# Patient Record
Sex: Female | Born: 1945 | Race: Black or African American | Hispanic: No | Marital: Married | Smoking: Never smoker
Health system: Southern US, Community
[De-identification: ages and names within clinical notes are randomized; demographics above are authoritative.]

## PROBLEM LIST (undated history)

## (undated) DIAGNOSIS — E785 Hyperlipidemia, unspecified: Secondary | ICD-10-CM

## (undated) DIAGNOSIS — E079 Disorder of thyroid, unspecified: Secondary | ICD-10-CM

## (undated) DIAGNOSIS — Z923 Personal history of irradiation: Secondary | ICD-10-CM

## (undated) DIAGNOSIS — I10 Essential (primary) hypertension: Secondary | ICD-10-CM

## (undated) DIAGNOSIS — Z803 Family history of malignant neoplasm of breast: Secondary | ICD-10-CM

## (undated) DIAGNOSIS — C50919 Malignant neoplasm of unspecified site of unspecified female breast: Secondary | ICD-10-CM

## (undated) DIAGNOSIS — Z800 Family history of malignant neoplasm of digestive organs: Secondary | ICD-10-CM

## (undated) DIAGNOSIS — Z973 Presence of spectacles and contact lenses: Secondary | ICD-10-CM

## (undated) DIAGNOSIS — C801 Malignant (primary) neoplasm, unspecified: Secondary | ICD-10-CM

## (undated) DIAGNOSIS — E119 Type 2 diabetes mellitus without complications: Secondary | ICD-10-CM

## (undated) DIAGNOSIS — Z972 Presence of dental prosthetic device (complete) (partial): Secondary | ICD-10-CM

## (undated) HISTORY — DX: Presence of dental prosthetic device (complete) (partial): Z97.2

## (undated) HISTORY — DX: Family history of malignant neoplasm of digestive organs: Z80.0

## (undated) HISTORY — DX: Type 2 diabetes mellitus without complications: E11.9

## (undated) HISTORY — DX: Family history of malignant neoplasm of breast: Z80.3

## (undated) HISTORY — PX: APPENDECTOMY: SHX54

## (undated) HISTORY — DX: Essential (primary) hypertension: I10

## (undated) HISTORY — DX: Malignant (primary) neoplasm, unspecified: C80.1

## (undated) HISTORY — DX: Hyperlipidemia, unspecified: E78.5

## (undated) HISTORY — PX: BREAST SURGERY: SHX581

## (undated) HISTORY — DX: Disorder of thyroid, unspecified: E07.9

## (undated) HISTORY — PX: TUBAL LIGATION: SHX77

## (undated) HISTORY — DX: Presence of spectacles and contact lenses: Z97.3

---

## 1998-04-05 ENCOUNTER — Other Ambulatory Visit: Admission: RE | Admit: 1998-04-05 | Discharge: 1998-04-05 | Payer: Self-pay | Admitting: *Deleted

## 2000-05-15 HISTORY — PX: BREAST LUMPECTOMY: SHX2

## 2001-04-29 ENCOUNTER — Other Ambulatory Visit: Admission: RE | Admit: 2001-04-29 | Discharge: 2001-04-29 | Payer: Self-pay | Admitting: *Deleted

## 2002-05-06 ENCOUNTER — Other Ambulatory Visit: Admission: RE | Admit: 2002-05-06 | Discharge: 2002-05-06 | Payer: Self-pay | Admitting: *Deleted

## 2006-05-15 HISTORY — PX: COLONOSCOPY: SHX174

## 2010-01-31 ENCOUNTER — Ambulatory Visit: Payer: Self-pay | Admitting: Genetic Counselor

## 2010-02-04 ENCOUNTER — Encounter: Admission: RE | Admit: 2010-02-04 | Discharge: 2010-02-04 | Payer: Self-pay | Admitting: General Surgery

## 2010-03-07 ENCOUNTER — Ambulatory Visit (HOSPITAL_COMMUNITY): Admission: RE | Admit: 2010-03-07 | Discharge: 2010-03-08 | Payer: Self-pay | Admitting: General Surgery

## 2010-03-07 ENCOUNTER — Encounter (INDEPENDENT_AMBULATORY_CARE_PROVIDER_SITE_OTHER): Payer: Self-pay | Admitting: General Surgery

## 2010-03-07 ENCOUNTER — Encounter: Admission: RE | Admit: 2010-03-07 | Discharge: 2010-03-07 | Payer: Self-pay | Admitting: General Surgery

## 2010-03-07 HISTORY — PX: BREAST LUMPECTOMY: SHX2

## 2010-03-10 ENCOUNTER — Ambulatory Visit: Payer: Self-pay | Admitting: Oncology

## 2010-03-14 ENCOUNTER — Ambulatory Visit: Payer: Self-pay | Admitting: Oncology

## 2010-04-05 ENCOUNTER — Ambulatory Visit
Admission: RE | Admit: 2010-04-05 | Discharge: 2010-06-03 | Payer: Self-pay | Source: Home / Self Care | Attending: Radiation Oncology | Admitting: Radiation Oncology

## 2010-06-02 ENCOUNTER — Ambulatory Visit: Payer: Self-pay | Admitting: Oncology

## 2010-06-06 LAB — CBC WITH DIFFERENTIAL/PLATELET
Eosinophils Absolute: 0.2 10*3/uL (ref 0.0–0.5)
HGB: 11.8 g/dL (ref 11.6–15.9)
MCV: 84.7 fL (ref 79.5–101.0)
MONO%: 8.3 % (ref 0.0–14.0)
NEUT#: 5.3 10*3/uL (ref 1.5–6.5)
RBC: 4.18 10*6/uL (ref 3.70–5.45)
RDW: 14.8 % — ABNORMAL HIGH (ref 11.2–14.5)
WBC: 7.6 10*3/uL (ref 3.9–10.3)
lymph#: 1.4 10*3/uL (ref 0.9–3.3)

## 2010-06-15 ENCOUNTER — Ambulatory Visit: Payer: BC Managed Care – PPO | Admitting: Radiation Oncology

## 2010-07-08 ENCOUNTER — Ambulatory Visit: Payer: BC Managed Care – PPO | Attending: Radiation Oncology | Admitting: Radiation Oncology

## 2010-07-27 LAB — DIFFERENTIAL
Basophils Absolute: 0 10*3/uL (ref 0.0–0.1)
Basophils Relative: 0 % (ref 0–1)
Eosinophils Relative: 2 % (ref 0–5)
Monocytes Absolute: 0.6 10*3/uL (ref 0.1–1.0)

## 2010-07-27 LAB — CBC
Hemoglobin: 11.8 g/dL — ABNORMAL LOW (ref 12.0–15.0)
MCV: 87 fL (ref 78.0–100.0)
Platelets: 284 10*3/uL (ref 150–400)
RBC: 4.14 MIL/uL (ref 3.87–5.11)
WBC: 11.4 10*3/uL — ABNORMAL HIGH (ref 4.0–10.5)

## 2010-07-27 LAB — COMPREHENSIVE METABOLIC PANEL
AST: 23 U/L (ref 0–37)
Albumin: 4.2 g/dL (ref 3.5–5.2)
Alkaline Phosphatase: 87 U/L (ref 39–117)
BUN: 18 mg/dL (ref 6–23)
CO2: 30 mEq/L (ref 19–32)
Chloride: 103 mEq/L (ref 96–112)
GFR calc Af Amer: >60 mL/min (ref >60)
Potassium: 4 mEq/L (ref 3.5–5.1)
Total Bilirubin: 0.4 mg/dL (ref 0.3–1.2)

## 2010-08-08 ENCOUNTER — Other Ambulatory Visit: Payer: Self-pay | Admitting: Oncology

## 2010-08-08 ENCOUNTER — Encounter (HOSPITAL_BASED_OUTPATIENT_CLINIC_OR_DEPARTMENT_OTHER): Payer: BC Managed Care – PPO | Admitting: Oncology

## 2010-08-08 DIAGNOSIS — C50919 Malignant neoplasm of unspecified site of unspecified female breast: Secondary | ICD-10-CM

## 2010-08-08 DIAGNOSIS — C50519 Malignant neoplasm of lower-outer quadrant of unspecified female breast: Secondary | ICD-10-CM

## 2010-08-08 LAB — CBC WITH DIFFERENTIAL/PLATELET
BASO%: 0.3 % (ref 0.0–2.0)
EOS%: 7.7 % — ABNORMAL HIGH (ref 0.0–7.0)
HCT: 35.5 % (ref 34.8–46.6)
LYMPH%: 18.4 % (ref 14.0–49.7)
MCH: 28.3 pg (ref 25.1–34.0)
MCHC: 33.5 g/dL (ref 31.5–36.0)
MCV: 84.3 fL (ref 79.5–101.0)
NEUT%: 66.2 % (ref 38.4–76.8)
Platelets: 242 10*3/uL (ref 145–400)

## 2010-08-08 LAB — COMPREHENSIVE METABOLIC PANEL
AST: 20 U/L (ref 0–37)
BUN: 14 mg/dL (ref 6–23)
Calcium: 9.7 mg/dL (ref 8.4–10.5)
Chloride: 101 mEq/L (ref 96–112)
Creatinine, Ser: 0.85 mg/dL (ref 0.40–1.20)

## 2010-08-16 ENCOUNTER — Other Ambulatory Visit: Payer: Self-pay | Admitting: Oncology

## 2010-08-16 ENCOUNTER — Encounter (HOSPITAL_BASED_OUTPATIENT_CLINIC_OR_DEPARTMENT_OTHER): Payer: BC Managed Care – PPO | Admitting: Oncology

## 2010-08-16 DIAGNOSIS — C50519 Malignant neoplasm of lower-outer quadrant of unspecified female breast: Secondary | ICD-10-CM

## 2010-08-16 DIAGNOSIS — C50919 Malignant neoplasm of unspecified site of unspecified female breast: Secondary | ICD-10-CM

## 2010-08-16 DIAGNOSIS — Z1231 Encounter for screening mammogram for malignant neoplasm of breast: Secondary | ICD-10-CM

## 2010-11-29 ENCOUNTER — Encounter (INDEPENDENT_AMBULATORY_CARE_PROVIDER_SITE_OTHER): Payer: BC Managed Care – PPO | Admitting: General Surgery

## 2011-01-02 ENCOUNTER — Encounter (INDEPENDENT_AMBULATORY_CARE_PROVIDER_SITE_OTHER): Payer: Self-pay | Admitting: General Surgery

## 2011-01-02 ENCOUNTER — Ambulatory Visit
Admission: RE | Admit: 2011-01-02 | Discharge: 2011-01-02 | Disposition: A | Discharge status: Home / Self Care | Payer: BC Managed Care – PPO | Source: Ambulatory Visit | Attending: Oncology | Admitting: Oncology

## 2011-01-02 ENCOUNTER — Ambulatory Visit (INDEPENDENT_AMBULATORY_CARE_PROVIDER_SITE_OTHER): Payer: BC Managed Care – PPO | Admitting: General Surgery

## 2011-01-02 DIAGNOSIS — Z1231 Encounter for screening mammogram for malignant neoplasm of breast: Secondary | ICD-10-CM

## 2011-01-02 DIAGNOSIS — Z853 Personal history of malignant neoplasm of breast: Secondary | ICD-10-CM

## 2011-01-02 DIAGNOSIS — C50919 Malignant neoplasm of unspecified site of unspecified female breast: Secondary | ICD-10-CM

## 2011-01-02 NOTE — Progress Notes (Signed)
Subjective:     Patient ID: Victoria Hess, female   DOB: 11/27/1945, 65 y.o.   MRN: [id number]  HPI This is a 65 year old female with a history of right breast DCIS treated previously I treated her in 2011 for a clinic for stage II left breast cancer there was a T1 C N1 MIC tumor. She underwent lumpectomy and sentinel node biopsy. This was followed by radiation therapy in that she's been maintained on letrozole now. She reports a number of things going on her life with her friends that have caused some issues with her. She reports no complaints referable to her own health or anything related to her breasts. She does have some still some occasional pain he complains of some thickness of her left breast also. She is due to get her mammogram today.  Review of Systems     Objective:   Physical Exam  Neck: Neck supple.  Pulmonary/Chest: Right breast exhibits no inverted nipple, no mass, no nipple discharge, no skin change and no tenderness. Left breast exhibits skin change (associated with radiation changes). Left breast exhibits no inverted nipple, no mass, no nipple discharge and no tenderness. Breasts are symmetrical.         No axillary adenopathy bilaterally  Lymphadenopathy:    She has no cervical adenopathy.       Assessment:     History of right breast DCIS and left breast T1cN78mic tumor treated with lump, sentinel node, xrt and letrozole    Plan:         She does not have anything concerning on her exam today. She is going to continue her own exams. She is due to get her mammogram today which I will followup. She is tolerating her letrozole very well. She previously had negative genetic testing. She is due to see Dr. Darnelle Catalan in October and asked her to come back and see me 6 months after that. The changes on the left breast are post radiation in nature and should continue to resolve over time.

## 2011-01-03 DIAGNOSIS — Z0000 Encounter for general adult medical examination without abnormal findings: Secondary | ICD-10-CM | POA: Insufficient documentation

## 2011-02-07 ENCOUNTER — Other Ambulatory Visit: Payer: Self-pay | Admitting: Oncology

## 2011-02-07 ENCOUNTER — Encounter (HOSPITAL_BASED_OUTPATIENT_CLINIC_OR_DEPARTMENT_OTHER): Payer: BC Managed Care – PPO | Admitting: Oncology

## 2011-02-07 DIAGNOSIS — C50919 Malignant neoplasm of unspecified site of unspecified female breast: Secondary | ICD-10-CM

## 2011-02-07 LAB — COMPREHENSIVE METABOLIC PANEL
Albumin: 3.7 g/dL (ref 3.5–5.2)
BUN: 19 mg/dL (ref 6–23)
CO2: 31 mEq/L (ref 19–32)
Calcium: 10.2 mg/dL (ref 8.4–10.5)
Chloride: 98 mEq/L (ref 96–112)
Glucose, Bld: 89 mg/dL (ref 70–99)
Potassium: 3.3 mEq/L — ABNORMAL LOW (ref 3.5–5.3)

## 2011-02-07 LAB — CBC WITH DIFFERENTIAL/PLATELET
Basophils Absolute: 0 10*3/uL (ref 0.0–0.1)
Eosinophils Absolute: 0.2 10*3/uL (ref 0.0–0.5)
HGB: 10.9 g/dL — ABNORMAL LOW (ref 11.6–15.9)
MCV: 86.8 fL (ref 79.5–101.0)
MONO#: 0.5 10*3/uL (ref 0.1–0.9)
MONO%: 6.3 % (ref 0.0–14.0)
NEUT#: 6.5 10*3/uL (ref 1.5–6.5)
RDW: 14.9 % — ABNORMAL HIGH (ref 11.2–14.5)
lymph#: 1.2 10*3/uL (ref 0.9–3.3)

## 2011-02-07 LAB — CANCER ANTIGEN 27.29: CA 27.29: 27 U/mL (ref 0–39)

## 2011-02-14 ENCOUNTER — Encounter (HOSPITAL_BASED_OUTPATIENT_CLINIC_OR_DEPARTMENT_OTHER): Payer: BC Managed Care – PPO | Admitting: Oncology

## 2011-02-14 DIAGNOSIS — F411 Generalized anxiety disorder: Secondary | ICD-10-CM

## 2011-02-14 DIAGNOSIS — C50519 Malignant neoplasm of lower-outer quadrant of unspecified female breast: Secondary | ICD-10-CM

## 2011-02-14 DIAGNOSIS — C50919 Malignant neoplasm of unspecified site of unspecified female breast: Secondary | ICD-10-CM

## 2011-05-04 ENCOUNTER — Ambulatory Visit (INDEPENDENT_AMBULATORY_CARE_PROVIDER_SITE_OTHER): Payer: BC Managed Care – PPO | Admitting: Internal Medicine

## 2011-05-04 ENCOUNTER — Encounter: Payer: Self-pay | Admitting: Internal Medicine

## 2011-05-04 DIAGNOSIS — Z853 Personal history of malignant neoplasm of breast: Secondary | ICD-10-CM

## 2011-05-04 DIAGNOSIS — R9431 Abnormal electrocardiogram [ECG] [EKG]: Secondary | ICD-10-CM

## 2011-05-04 DIAGNOSIS — N814 Uterovaginal prolapse, unspecified: Secondary | ICD-10-CM

## 2011-05-04 DIAGNOSIS — N8112 Cystocele, lateral: Secondary | ICD-10-CM

## 2011-05-04 DIAGNOSIS — E785 Hyperlipidemia, unspecified: Secondary | ICD-10-CM

## 2011-05-04 DIAGNOSIS — I10 Essential (primary) hypertension: Secondary | ICD-10-CM

## 2011-05-15 ENCOUNTER — Encounter: Payer: Self-pay | Admitting: Internal Medicine

## 2011-05-15 DIAGNOSIS — I10 Essential (primary) hypertension: Secondary | ICD-10-CM | POA: Insufficient documentation

## 2011-05-15 NOTE — Patient Instructions (Signed)
Continue same medications and return in 6 months 

## 2011-06-13 ENCOUNTER — Other Ambulatory Visit: Payer: Self-pay | Admitting: *Deleted

## 2011-06-13 DIAGNOSIS — C50919 Malignant neoplasm of unspecified site of unspecified female breast: Secondary | ICD-10-CM

## 2011-06-13 MED ORDER — LETROZOLE 2.5 MG PO TABS: 2.5000 mg | ORAL_TABLET | Freq: Every day | ORAL | Status: DC

## 2011-06-26 ENCOUNTER — Other Ambulatory Visit: Payer: Self-pay | Admitting: Internal Medicine

## 2011-06-26 ENCOUNTER — Other Ambulatory Visit: Payer: BC Managed Care – PPO | Admitting: Internal Medicine

## 2011-06-26 DIAGNOSIS — I10 Essential (primary) hypertension: Secondary | ICD-10-CM | POA: Diagnosis not present

## 2011-06-26 DIAGNOSIS — E559 Vitamin D deficiency, unspecified: Secondary | ICD-10-CM

## 2011-06-26 DIAGNOSIS — R7301 Impaired fasting glucose: Secondary | ICD-10-CM | POA: Diagnosis not present

## 2011-06-26 LAB — CBC WITH DIFFERENTIAL/PLATELET
Basophils Absolute: 0 10*3/uL (ref 0.0–0.1)
Lymphocytes Relative: 20 % (ref 12–46)
Lymphs Abs: 1.5 10*3/uL (ref 0.7–4.0)
Neutrophils Relative %: 68 % (ref 43–77)
Platelets: 269 10*3/uL (ref 150–400)
RBC: 4.14 MIL/uL (ref 3.87–5.11)
RDW: 15.1 % (ref 11.5–15.5)
WBC: 7.6 10*3/uL (ref 4.0–10.5)

## 2011-06-26 LAB — COMPREHENSIVE METABOLIC PANEL
ALT: 14 U/L (ref 0–35)
AST: 19 U/L (ref 0–37)
CO2: 30 mEq/L (ref 19–32)
Calcium: 10.1 mg/dL (ref 8.4–10.5)
Chloride: 99 mEq/L (ref 96–112)
Sodium: 139 mEq/L (ref 135–145)
Total Bilirubin: 0.4 mg/dL (ref 0.3–1.2)
Total Protein: 7.2 g/dL (ref 6.0–8.3)

## 2011-06-26 LAB — LIPID PANEL
Cholesterol: 217 mg/dL — ABNORMAL HIGH (ref 0–200)
Total CHOL/HDL Ratio: 3.1 Ratio
VLDL: 14 mg/dL (ref 0–40)

## 2011-06-26 LAB — TSH: TSH: 4.875 u[IU]/mL — ABNORMAL HIGH (ref 0.350–4.500)

## 2011-06-27 LAB — VITAMIN D 25 HYDROXY (VIT D DEFICIENCY, FRACTURES): Vit D, 25-Hydroxy: 56 ng/mL (ref 30–89)

## 2011-06-29 ENCOUNTER — Encounter: Payer: Self-pay | Admitting: Internal Medicine

## 2011-06-29 ENCOUNTER — Ambulatory Visit (INDEPENDENT_AMBULATORY_CARE_PROVIDER_SITE_OTHER): Payer: BC Managed Care – PPO | Admitting: Internal Medicine

## 2011-06-29 VITALS — BP 174/94 | HR 76 | Ht 62.5 in | Wt 216.0 lb

## 2011-06-29 DIAGNOSIS — Z1211 Encounter for screening for malignant neoplasm of colon: Secondary | ICD-10-CM

## 2011-06-29 DIAGNOSIS — E039 Hypothyroidism, unspecified: Secondary | ICD-10-CM | POA: Diagnosis not present

## 2011-06-29 DIAGNOSIS — D649 Anemia, unspecified: Secondary | ICD-10-CM | POA: Diagnosis not present

## 2011-06-29 DIAGNOSIS — I10 Essential (primary) hypertension: Secondary | ICD-10-CM | POA: Diagnosis not present

## 2011-06-29 DIAGNOSIS — E669 Obesity, unspecified: Secondary | ICD-10-CM

## 2011-06-29 DIAGNOSIS — Z124 Encounter for screening for malignant neoplasm of cervix: Secondary | ICD-10-CM

## 2011-06-29 DIAGNOSIS — E119 Type 2 diabetes mellitus without complications: Secondary | ICD-10-CM

## 2011-06-29 DIAGNOSIS — E785 Hyperlipidemia, unspecified: Secondary | ICD-10-CM

## 2011-06-29 DIAGNOSIS — Z853 Personal history of malignant neoplasm of breast: Secondary | ICD-10-CM

## 2011-06-29 LAB — POCT URINALYSIS DIPSTICK
Bilirubin, UA: NEGATIVE
Blood, UA: NEGATIVE
Leukocytes, UA: NEGATIVE
Nitrite, UA: NEGATIVE
Protein, UA: NEGATIVE
Urobilinogen, UA: NEGATIVE
pH, UA: 5.5

## 2011-06-29 LAB — HEMOGLOBIN A1C
Hgb A1c MFr Bld: 6.3 % — ABNORMAL HIGH (ref <5.7)
Mean Plasma Glucose: 134 mg/dL — ABNORMAL HIGH (ref <117)

## 2011-06-29 LAB — IRON AND TIBC: TIBC: 368 ug/dL (ref 250–470)

## 2011-06-30 ENCOUNTER — Other Ambulatory Visit (HOSPITAL_COMMUNITY)
Admission: RE | Admit: 2011-06-30 | Discharge: 2011-06-30 | Disposition: A | Discharge status: Home / Self Care | Payer: BC Managed Care – PPO | Source: Ambulatory Visit | Attending: Internal Medicine | Admitting: Internal Medicine

## 2011-06-30 DIAGNOSIS — Z124 Encounter for screening for malignant neoplasm of cervix: Secondary | ICD-10-CM | POA: Insufficient documentation

## 2011-07-19 NOTE — Progress Notes (Signed)
Subjective:    Patient ID: Victoria Hess, female    DOB: January 20, 1946, 66 y.o.   MRN: [id number]  HPI First  visit for 66 year old Black female referred by Dr. Darnelle Catalan with history of right breast cancer in 1992 and left breast cancer in 2011. History of hypertension controlled on Bystolic, HCTZ and amlodipine. History of tonsillectomy and adenoidectomy 1955, appendectomy and tubal ligation 1977.  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 She is married. Lives in [place]. 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  Patient says she has a history of abnormal EKG. Brings no old records with her but set from Dr. Gaye Pollack @[facility]. History of hyperlipidemia with total cholesterol of 233 and LDL cholesterol of 159 in July 2010 bowel records. Triglycerides were normal the and and HDL cholesterol was 57.  Had bilateral ductal carcinoma in situ. Had wide excision procedure of the left breast in 2011 and a lesser procedure done on the right breast in 2002. No old EKG to examine. Says she had an EKG done at [facility] for we have not been able to find that. Patient says that her EKG looks "funny" but [facility] assured her it was within normal limits. History of uterine fibroids, cystocele and uterine prolapse.  Patient does not smoke or consume alcohol. She has an adult daughter age 64. Husband is 33 years old and in good health.  Family history: Father died at age 28 of a stroke mother died from coronary disease. One brother age 59 with history of hypertension and diabetes, one sister with hypertension and diabetes, another sister with hypertension, one sister in good health without medical problems.  Patient does not smoke or consume alcohol.  Pap smear done 12/23/2009 in [place] was negative. Had bone density study there which was normal 12/31/2009. Had colonoscopy March 2008. Tdap 3/08. Had Pneumovax  01/03/2011.    Review of Systems  Constitutional: Negative.   HENT: Negative.   Eyes: Negative.   Respiratory: Negative.   Cardiovascular: Negative.   Gastrointestinal: Negative.   Genitourinary: Negative.   Musculoskeletal: Negative.   Neurological: Negative.   Hematological: Negative.   Psychiatric/Behavioral: Negative.        Objective:   Physical Exam  Vitals reviewed. Constitutional: She is oriented to person, place, and time. She appears well-developed and well-nourished.  HENT:  Head: Normocephalic and atraumatic.  Right Ear: External ear normal.  Left Ear: External ear normal.  Mouth/Throat: Oropharynx is clear and moist. No oropharyngeal exudate.  Eyes: Conjunctivae and EOM are normal. Pupils are equal, round, and reactive to light. Right eye exhibits no discharge.  Neck: Normal range of motion. Neck supple. No JVD present. No thyromegaly present.  Cardiovascular: Normal rate, regular rhythm, normal heart sounds and intact distal pulses.   No murmur heard. Pulmonary/Chest: Effort normal and breath sounds normal. She has no rales.  Abdominal: Soft. Bowel sounds are normal. She exhibits no distension and no mass. There is no rebound and no guarding.  Genitourinary:       Deferred  Musculoskeletal: She exhibits no edema.  Lymphadenopathy:    She has no cervical adenopathy.  Neurological: She is alert and oriented to person, place, and time. She has normal reflexes. No cranial nerve deficit. Coordination normal.  Skin: Skin is warm and dry. She is not diaphoretic.  Psychiatric: She has a normal mood and affect. Her behavior is normal.  Assessment & Plan:  History of bilateral ductal carcinoma in situ  Hypertension  Hyperlipidemia  Cystocele  Uterine prolapse  Abnormal EKG  Plan: Patient is to help me find her old EKG in cardiology evaluation. [facility] says they have no cardiology records on file. Patient is to return for followup in the next few  weeks.

## 2011-07-20 ENCOUNTER — Encounter: Payer: Self-pay | Admitting: Internal Medicine

## 2011-08-01 ENCOUNTER — Encounter (INDEPENDENT_AMBULATORY_CARE_PROVIDER_SITE_OTHER): Payer: Self-pay | Admitting: General Surgery

## 2011-08-01 ENCOUNTER — Ambulatory Visit (INDEPENDENT_AMBULATORY_CARE_PROVIDER_SITE_OTHER): Payer: BC Managed Care – PPO | Admitting: General Surgery

## 2011-08-01 VITALS — BP 168/104 | HR 60 | Temp 96.8°F | Resp 16 | Ht 64.5 in | Wt 215.0 lb

## 2011-08-01 DIAGNOSIS — Z853 Personal history of malignant neoplasm of breast: Secondary | ICD-10-CM | POA: Diagnosis not present

## 2011-08-01 NOTE — Progress Notes (Signed)
Subjective:     Patient ID: Victoria Hess, female   DOB: 1946/03/13, 66 y.o.   MRN: [id number]  HPI 41 yof with history of right lumpectomy and then I did left lumpectomy with snbx for t1n52mic tumor.  This was followed by xrt and then now has been maintained on letrozole which she is tolerating well.  She reports no complaints referable to her breasts.  She had mmg in August of 2012 and is due to get one next August as well.  She comes in today for six month follow up.  Review of Systems     Objective:   Physical Exam  Vitals reviewed. Constitutional: She appears well-developed and well-nourished.  Neck: Neck supple.  Cardiovascular: Normal rate, regular rhythm and normal heart sounds.   Pulmonary/Chest: Effort normal and breath sounds normal. She has no wheezes. She has no rales. Right breast exhibits no inverted nipple, no mass, no nipple discharge, no skin change and no tenderness. Left breast exhibits skin change (consistent with radiation therapy). Left breast exhibits no inverted nipple, no mass, no nipple discharge and no tenderness. Breasts are symmetrical.    Lymphadenopathy:    She has no cervical adenopathy.    She has no axillary adenopathy.       Right: No supraclavicular adenopathy present.       Left: No supraclavicular adenopathy present.       Assessment:    History of breast cancer    Plan:    She has no clinical evidence of recurrence and her mmg is up to date. She is doing self exams. She is tolerating letrozole well now.  I will refer her back to see Dr. Darnelle Catalan in six months as she is on AI.  She should have her mmg when she sees him also.  I will plan on seeing her back in one year.

## 2011-08-01 NOTE — Patient Instructions (Signed)

## 2011-08-04 ENCOUNTER — Telehealth (INDEPENDENT_AMBULATORY_CARE_PROVIDER_SITE_OTHER): Payer: Self-pay

## 2011-08-04 NOTE — Telephone Encounter (Signed)
LMOM with Dr Margrinat's nurse v.m. Stating that the pt needed a f/u appt with Dr Darnelle Catalan in 6months since she just saw Dr Dwain Sarna this am. The pt just needs to be seeing them every 6 months per Dr Dwain Sarna.

## 2011-08-08 ENCOUNTER — Telehealth: Payer: Self-pay | Admitting: Oncology

## 2011-08-08 NOTE — Telephone Encounter (Signed)
S/w pt today re appts for 4/3 and 4/16.

## 2011-08-12 ENCOUNTER — Encounter: Payer: Self-pay | Admitting: Internal Medicine

## 2011-08-12 DIAGNOSIS — D649 Anemia, unspecified: Secondary | ICD-10-CM | POA: Insufficient documentation

## 2011-08-12 DIAGNOSIS — E785 Hyperlipidemia, unspecified: Secondary | ICD-10-CM | POA: Insufficient documentation

## 2011-08-12 DIAGNOSIS — E1169 Type 2 diabetes mellitus with other specified complication: Secondary | ICD-10-CM | POA: Insufficient documentation

## 2011-08-12 DIAGNOSIS — E669 Obesity, unspecified: Secondary | ICD-10-CM | POA: Insufficient documentation

## 2011-08-12 DIAGNOSIS — E039 Hypothyroidism, unspecified: Secondary | ICD-10-CM | POA: Insufficient documentation

## 2011-08-12 NOTE — Progress Notes (Signed)
  Subjective:    Patient ID: Victoria Hess, female    DOB: [birth year], 66 y.o.   MRN: [id number]  HPI 66 year old black female initially presented here 05/04/2011 for the first time upon referral by oncologist for primary care. We don't have a lot of records on this patient from previous physicians who treated her. Patient says that she had cardiology evaluation previously  In [place]. We have not been able to locate those records. We have asked patient to get [place] those records. Records from [facility] in August 2012 indicate patient was on HCTZ 25 mg daily, amlodipine 5 mg daily and diastolic 20 mg daily for hypertension. Patient reports that she has an abnormal EKG. EKG done today shows Q-wave inversion in V2 through V5, intraventricular conduction delay and  sinus arrhythmia. Patient is also here today to have Pap smear done. She had recent fasting lab work showing a mild anemia with hemoglobin of 11.4 g. Anemia workup will be added to lab work including B12, folate, iron and iron-binding capacity. She also has been found to be hypothyroid. She will need to start thyroid replacement therapy. TSH is 4.8. She will be given 3 Hemoccult cards. Her blood pressure is still not well controlled. She also has been found to be diabetic based on recent fasting lab work with hemoglobin A1c of 6.3%.    Review of Systems     Objective:   Physical Exam chest clear to auscultation; cardiac exam regular rate and rhythm. Pelvic exam has cystocele. Pap smear taken. No masses on bimanual exam.        Assessment & Plan:  Hypertension-currently not well controlled based on current reading  Hyperlipidemia  Diabetes mellitus -new diagnosis  History of breast cancer  New diagnosis of hypothyroidism  Obesity  History of abnormal EKG by history but no old EKGs to compare. Patient says cardiac workup in [place] was normal. She needs to get those records.  Mild anemia-iron level is low normal. B12  and folate levels are normal. 3 Hemoccult cards given. Had colonoscopy in 2008. Patient should take iron supplementation. We need to followup with mild anemia and abnormal hemoglobin A1c and TSH in about 3 months.

## 2011-08-16 ENCOUNTER — Other Ambulatory Visit (HOSPITAL_BASED_OUTPATIENT_CLINIC_OR_DEPARTMENT_OTHER): Payer: BC Managed Care – PPO | Admitting: Lab

## 2011-08-16 ENCOUNTER — Ambulatory Visit: Payer: BC Managed Care – PPO | Admitting: Physician Assistant

## 2011-08-16 DIAGNOSIS — C50919 Malignant neoplasm of unspecified site of unspecified female breast: Secondary | ICD-10-CM

## 2011-08-16 DIAGNOSIS — C50519 Malignant neoplasm of lower-outer quadrant of unspecified female breast: Secondary | ICD-10-CM | POA: Diagnosis not present

## 2011-08-16 LAB — CBC & DIFF AND RETIC
Basophils Absolute: 0 10*3/uL (ref 0.0–0.1)
Eosinophils Absolute: 0.2 10*3/uL (ref 0.0–0.5)
HGB: 11.6 g/dL (ref 11.6–15.9)
Immature Retic Fract: 10.6 % — ABNORMAL HIGH (ref 1.60–10.00)
MCV: 84.1 fL (ref 79.5–101.0)
NEUT#: 5.5 10*3/uL (ref 1.5–6.5)
RDW: 14.8 % — ABNORMAL HIGH (ref 11.2–14.5)
Retic Ct Abs: 61.98 10*3/uL (ref 33.70–90.70)
lymph#: 1.8 10*3/uL (ref 0.9–3.3)

## 2011-08-16 LAB — COMPREHENSIVE METABOLIC PANEL
AST: 18 U/L (ref 0–37)
Albumin: 4.4 g/dL (ref 3.5–5.2)
BUN: 19 mg/dL (ref 6–23)
CO2: 30 mEq/L (ref 19–32)
Calcium: 10 mg/dL (ref 8.4–10.5)
Creatinine, Ser: 0.86 mg/dL (ref 0.50–1.10)
Glucose, Bld: 75 mg/dL (ref 70–99)
Sodium: 141 mEq/L (ref 135–145)

## 2011-08-16 LAB — FERRITIN: Ferritin: 107 ng/mL (ref 10–291)

## 2011-08-22 ENCOUNTER — Ambulatory Visit: Payer: BC Managed Care – PPO | Admitting: Physician Assistant

## 2011-08-29 ENCOUNTER — Telehealth: Payer: Self-pay | Admitting: *Deleted

## 2011-08-29 ENCOUNTER — Ambulatory Visit (HOSPITAL_BASED_OUTPATIENT_CLINIC_OR_DEPARTMENT_OTHER): Payer: BC Managed Care – PPO | Admitting: Physician Assistant

## 2011-08-29 ENCOUNTER — Encounter: Payer: Self-pay | Admitting: Physician Assistant

## 2011-08-29 VITALS — BP 179/102 | HR 72 | Temp 98.4°F | Ht 64.5 in | Wt 218.4 lb

## 2011-08-29 DIAGNOSIS — C50919 Malignant neoplasm of unspecified site of unspecified female breast: Secondary | ICD-10-CM

## 2011-08-29 DIAGNOSIS — Z853 Personal history of malignant neoplasm of breast: Secondary | ICD-10-CM

## 2011-08-29 MED ORDER — LETROZOLE 2.5 MG PO TABS: 2.5000 mg | ORAL_TABLET | Freq: Every day | ORAL | Status: DC

## 2011-08-29 NOTE — Telephone Encounter (Signed)
made patient appointment for mammogram and bone density on 01-04-2012 at 3:00pm gave patient appointment for 02-2012 lab one week before the md appointment

## 2011-08-29 NOTE — Progress Notes (Signed)
ID: Victoria Hess   DOB: 05-14-1946  MR#: [id number]  CSN#:[id number]  HISTORY OF PRESENT ILLNESS: The patient has a history of prior right lumpectomy and radiation therapy (2002) for what by her account was a Stage 0 ductal carcinoma in situ.  More recently, she had diagnostic mammography at [facility] on December 31, 2009 and this showed group calcifications in the left breast felt to be moderately suspicious for malignancy.  The patient was set up for stereotactic biopsy 01/04/2010 and the pathology report [id number] at the [facility]) showed a small focus of invasive adenocarcinoma, grade 1, with some evidence of in situ carcinoma.  The tumor was estrogen receptor positive at 100%, progesterone receptor positive at 100% and there was no Her-2 amplification by the Hercept test with a score of 1+.    With this information, the patient was referred to [facility] for bilateral breast MRIs and these were performed February 04, 2010.  There was some postoperative change in the upper outer quadrant of the right breast with no suspicious findings.  In the left breast, lower outer quadrant, there was an irregular spiculated enhancing mass measuring 2.3 cm maximally.  There were no other masses associated with this and there were no suspicious internal mammary or axillary lymph nodes noted.    With this information, the patient was referred to Dr. Dwain Sarna and he set her up for genetic testing which came back favorable (no BRCA-1 or BRCA-2 mutations detected).  He then set her up for lumpectomy and sentinel lymph node dissection which was performed March 07, 2010.  The final pathology from that procedure ([id number]) showed a Grade 1 invasive ductal carcinoma measuring 1.5 cm with margins negative and ample.  There was no lymphovascular invasion.  One of four sentinel lymph nodes sampled had a micrometastatic deposit.  Repeat prognostic panel showed the tumor to be ER  positive at 100%, PR positive at 66% and showed no Her-2 amplification by CISH with a ratio of 1.24.  The patient has had an uneventful postoperative course.  The patient proceeded to radiation therapy which was completed in January 2012. She began letrozole in January 2012 and continues, 2.5 mg daily, with good tolerance.  INTERVAL HISTORY: Victoria Hess returns today for routine six-month followup of her left breast carcinoma. Interval history is generally unremarkable. She is being followed regularly by Dr. Lenord Fellers for primary care, and in fact has an appointment with her in a couple of weeks.  Victoria Hess  continues on letrozole which she is tolerating well. She has no significant hot flashes, no increased joint pain, no vaginal dryness.   REVIEW OF SYSTEMS: The patient has had no recent illnesses, other than a slight cold a few weeks ago, and denies any fevers, chills, or night sweats. Her energy level is fair. She feels a little tearful at times, and feels like she is "more sensitive than she used to be" since going through the cancer diagnosis. She denies any actual depression or feelings of hopelessness, and denies suicidal ideations.  Bit nausea and no change in bowel habits. No cough, shortness of breath, or chest pain. No abnormal headaches, dizziness, change in vision.  A detailed review of systems is otherwise noncontributory.   PAST MEDICAL HISTORY: Past Medical History  Diagnosis Date  . Hypertension   . Wears glasses   . Wears dentures     partial  . Cancer     Stage 2 breast cancer    PAST SURGICAL HISTORY:  Past Surgical History  Procedure Date  . Tubal ligation   . Appendectomy   . Breast surgery     lumpectomy sentinel  node biopsy    FAMILY HISTORY Family History  Problem Relation Age of Onset  . Diabetes Mother   . Heart disease Mother   . Diabetes Father   . Cancer Father   . Diabetes Sister   . Hyperlipidemia Sister   . Cancer Sister     breast      GYNECOLOGIC HISTORY: The patient is GX P1, first pregnancy to term at age 57. She went through the change of life around the year 2000.  She did not receive hormone replacement.  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  Her husband of 44 years, Les Pou, works in Surveyor, quantity.  Daughter Albin Felling, 52, works as a temp.  The patient has two granddaughters, aged 3 and 68.  She is a Control and instrumentation engineer    ADVANCED DIRECTIVES:  HEALTH MAINTENANCE: History  Substance Use Topics  . Smoking status: Never Smoker   . Smokeless tobacco: Never Used  . Alcohol Use: No     Colonoscopy:  PAP:  Bone density:  Lipid panel:  No Known Allergies  Current Outpatient Prescriptions  Medication Sig Dispense Refill  . AMLODIPINE BESYLATE PO Take 5 mg by mouth daily. Takes 1 1/2 tabs daily      . aspirin 81 MG tablet Take 81 mg by mouth daily.        . Calcium Carbonate (CALCIUM 600 PO) Take by mouth daily.        . Cholecalciferol (VITAMIN D PO) Take 2,000 mg by mouth daily.        . hydrochlorothiazide 25 MG tablet Take 25 mg by mouth daily.        Marland Kitchen letrozole (FEMARA) 2.5 MG tablet Take 1 tablet (2.5 mg total) by mouth daily.  90 tablet  3  . Multiple Vitamin (MULTIVITAMIN PO) Take by mouth daily.        . Nebivolol HCl (BYSTOLIC) 20 MG TABS Take by mouth daily.          OBJECTIVE: Filed Vitals:   08/29/11 1514  BP: 179/102  Pulse: 72  Temp: 98.4 F (36.9 C)     Body mass index is 36.91 kg/(m^2).    ECOG FS: 0  Physical Exam: HEENT:  Sclerae anicteric, conjunctivae pink.  Oropharynx clear.  No mucositis or candidiasis.   Nodes:  No cervical, supraclavicular, or axillary lymphadenopathy palpated.  Breast Exam:  Right breast is status post lumpectomy, no suspicious nodules or masses, no skin changes, no nipple inversion. Left breast status post lumpectomy. No suspicious nodularities or skin changes. There is some hyperpigmentation secondary to radiation  changes, but no evidence of local recurrence.  Lungs:  Clear to auscultation bilaterally.  No crackles, rhonchi, or wheezes.   Heart:  Regular rate , irregular rhythm. No murmurs or rubs. Abdomen:  Soft, nontender.  Positive bowel sounds.  No organomegaly or masses palpated.   Musculoskeletal:  No focal spinal tenderness to palpation.  Extremities:  Benign.  No peripheral edema or cyanosis.   Skin:  Benign.   Neuro:  Nonfocal. alert and oriented x3.    LAB RESULTS: Lab Results  Component Value Date   WBC 8.0 08/16/2011   NEUTROABS 5.5 08/16/2011   HGB 11.6 08/16/2011   HCT 35.0 08/16/2011   MCV 84.1 08/16/2011   PLT 268  08/16/2011      Chemistry      Component Value Date/Time   NA 141 08/16/2011 1358   K 3.9 08/16/2011 1358   CL 101 08/16/2011 1358   CO2 30 08/16/2011 1358   BUN 19 08/16/2011 1358   CREATININE 0.86 08/16/2011 1358   CREATININE 0.77 06/26/2011 0910      Component Value Date/Time   CALCIUM 10.0 08/16/2011 1358   ALKPHOS 87 08/16/2011 1358   AST 18 08/16/2011 1358   ALT 18 08/16/2011 1358   BILITOT 0.3 08/16/2011 1358       Lab Results  Component Value Date   LABCA2 33 08/16/2011   Ferritin within normal range at 107 on 08/16/2011.   STUDIES: This recent bilateral mammogram was in August 2012 at the [facility] with no evidence of malignancy.  Most recent bone density was at [facility] in August 2011, and was normal.  ASSESSMENT:  66 year old [place] woman: 1. Status post right lumpectomy 2002 for what likely was a ductal carcinoma in situ, treated with radiation under [facility] Bal. 2. Status post left lumpectomy October 2011 for a T1c N1 (mic) stage II invasive ductal carcinoma, grade 1, strongly estrogen and progesterone receptor positive, HER2/neu negative.  Margins were ample.  After radiation therapy completed in January 2012, she started letrozole with good tolerance.    PLAN: Overall, Jakiya appears to be doing well. There is no clinical evidence of  disease recurrence, and she will continue on letrozole which I have refilled for another year. We discussed participation in the  [facility] program, but her hindrance is the fact that she lives in [place]. She may contact Lenell Antu who helps organize the group here to see if she could link in with a program in [place].   With regards to followup, she'll continue to follow with Dr. Lenord Fellers as before for routine health care. She'll be due for her next mammogram as well as her next bone density in August and we'll see Dr. Darnelle Catalan soon thereafter in October for her 6 month followup. We will recheck labs at that time as well.  Patient voices understanding and agreement with our plan, and will call with any changes or problems.   Braedyn Kauk    08/29/2011

## 2011-09-07 ENCOUNTER — Ambulatory Visit (INDEPENDENT_AMBULATORY_CARE_PROVIDER_SITE_OTHER): Payer: BC Managed Care – PPO | Admitting: Internal Medicine

## 2011-09-07 VITALS — BP 164/82 | HR 58 | Ht 64.0 in | Wt 216.0 lb

## 2011-09-07 DIAGNOSIS — I10 Essential (primary) hypertension: Secondary | ICD-10-CM

## 2011-09-07 DIAGNOSIS — E039 Hypothyroidism, unspecified: Secondary | ICD-10-CM

## 2011-10-12 ENCOUNTER — Ambulatory Visit (INDEPENDENT_AMBULATORY_CARE_PROVIDER_SITE_OTHER): Payer: BC Managed Care – PPO | Admitting: Internal Medicine

## 2011-10-12 ENCOUNTER — Encounter: Payer: Self-pay | Admitting: Internal Medicine

## 2011-10-12 VITALS — BP 156/84 | HR 56 | Temp 99.1°F | Wt 215.0 lb

## 2011-10-12 DIAGNOSIS — Z853 Personal history of malignant neoplasm of breast: Secondary | ICD-10-CM | POA: Diagnosis not present

## 2011-10-12 DIAGNOSIS — I10 Essential (primary) hypertension: Secondary | ICD-10-CM | POA: Diagnosis not present

## 2011-10-12 NOTE — Progress Notes (Signed)
  Subjective:    Patient ID: Victoria Hess, female    DOB: 1945/06/30, 66 y.o.   MRN: [id number]  HPI patient with history of hypertension which has been somewhat difficult to control. She's on Bystolic 20 mg daily, HCTZ 25 mg daily, amlodipine 10 mg daily.  In addition she has diet-controlled diabetes with hemoglobin A1c of 6.3%, was started on Synthroid recently for hypothyroidism. Unable to find any more cardiac records on her.    Review of Systems     Objective:   Physical Exam neck is supple without JVD thyromegaly or carotid bruits; cardiac exam regular rate and rhythm; extremities 1+ edema        Assessment & Plan:  Hypertension-not well controlled on current regimen  Edema related to amlodipine 10 mg daily    Plan: Patient to discontinue HCTZ and start Lasix 40 mg daily. She doesn't want to start Lasix until school is out June 7. She will return here in early July for blood pressure followup and basic metabolic panel. We are starting her on K-Dur 20 MA daily twice daily. Potassium checked in the recent past showed a borderline normal. Lasix will likely result in hypokalemia. She may need cardiology consultation if blood pressure can't be controlled. She says mother had history of severe hypertension.

## 2011-10-12 NOTE — Patient Instructions (Signed)
Discontinue HCTZ. Start Lasix 40 mg daily. Take potassium supplementation twice daily. Return around July 7 for followup.

## 2011-10-30 ENCOUNTER — Other Ambulatory Visit: Payer: Self-pay

## 2011-10-30 MED ORDER — LEVOTHYROXINE SODIUM 50 MCG PO TABS: 50.0000 ug | ORAL_TABLET | Freq: Every day | ORAL | Status: DC

## 2011-11-14 NOTE — Progress Notes (Signed)
  Subjective:    Patient ID: Victoria Hess, female    DOB: 1945/09/21, 66 y.o.   MRN: [id number]  HPI 66 year old black female in today for followup of hypertension. She is a history of breast cancer is followed at the [facility]. She is on letrozole. She is on Bystolic 20 mg daily and HCTZ 25 mg daily. Has been taking amlodipine 1-1/2 tabs daily of a 5 mg tablet. We're trying to locate some of her old records from physician in [place]. I had asked her to keep multiple blood pressure readings since the initial visit with me. Blood pressure readings could be better. She also has mild hypothyroidism and diabetes mellitus. Hemoglobin A1c was 6.3%. Was found to be mildly iron deficient with hemoglobin 11.4 g and was started on iron replacement in February.    Review of Systems     Objective:   Physical Exam neck supple without JVD thyromegaly or carotid bruits; chest clear to auscultation; cardiac exam regular rate and rhythm; extremities trace edema        Assessment & Plan:  Hypertension  Plan: Increase amlodipine to 10 mg daily. Return in 4 weeks.

## 2011-11-21 ENCOUNTER — Ambulatory Visit: Payer: BC Managed Care – PPO | Admitting: Internal Medicine

## 2011-11-28 ENCOUNTER — Ambulatory Visit (INDEPENDENT_AMBULATORY_CARE_PROVIDER_SITE_OTHER): Payer: BC Managed Care – PPO | Admitting: Internal Medicine

## 2011-11-28 ENCOUNTER — Encounter: Payer: Self-pay | Admitting: Internal Medicine

## 2011-11-28 VITALS — BP 154/80 | HR 64 | Temp 98.8°F | Ht 64.0 in | Wt 201.5 lb

## 2011-11-28 DIAGNOSIS — I10 Essential (primary) hypertension: Secondary | ICD-10-CM

## 2011-11-28 LAB — BASIC METABOLIC PANEL
BUN: 13 mg/dL (ref 6–23)
CO2: 29 mEq/L (ref 19–32)
Calcium: 10.1 mg/dL (ref 8.4–10.5)
Chloride: 103 mEq/L (ref 96–112)
Creat: 0.89 mg/dL (ref 0.50–1.10)

## 2011-11-28 MED ORDER — POTASSIUM CHLORIDE CRYS ER 20 MEQ PO TBCR: 20.0000 meq | EXTENDED_RELEASE_TABLET | Freq: Two times a day (BID) | ORAL | Status: DC

## 2011-11-28 NOTE — Progress Notes (Signed)
  Subjective:    Patient ID: Victoria Hess, female    DOB: 05-26-1945, 66 y.o.   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 We have been trying to get her hypertension under better control. Last visit we added Lasix 40 mg daily instead of HCTZ. She brings in multiple blood pressure readings today taken from home which are very acceptable. Says she has an element of office hypertension which is been present for a number of years. Blood pressure today is elevated systolically in this office. Blood pressure readings taken at home since late June show systolic readings 118-136 and diastolic readings 72-78. Be met drawn today to followup on potassium on Lasix. She's tolerating Lasix fairly well at the present time. She is on a potassium supplement.    Review of Systems     Objective:   Physical Exam neck supple without thyromegaly JVD or carotid bruits; chest clear to auscultation; cardiac exam regular rate and rhythm normal S1 and S2; extremities without edema. Skin is warm and dry. Alert and oriented x3.        Assessment & Plan:   Office hypertension  Hypertension-better controlled on Lasix and HCTZ  Plan: Patient is to return in 4 months and continue to monitor her blood pressures at home.

## 2011-11-28 NOTE — Patient Instructions (Addendum)
Continue same medications and return in 4 months. Continue to monitor your blood pressures at home.

## 2011-12-10 ENCOUNTER — Encounter: Payer: Self-pay | Admitting: Internal Medicine

## 2011-12-10 NOTE — Patient Instructions (Addendum)
Increase amlodipine to 10 mg daily and return in 4 weeks

## 2012-01-04 ENCOUNTER — Ambulatory Visit
Admission: RE | Admit: 2012-01-04 | Discharge: 2012-01-04 | Disposition: A | Discharge status: Home / Self Care | Payer: BC Managed Care – PPO | Source: Ambulatory Visit | Attending: Physician Assistant | Admitting: Physician Assistant

## 2012-01-04 DIAGNOSIS — C50919 Malignant neoplasm of unspecified site of unspecified female breast: Secondary | ICD-10-CM

## 2012-01-04 DIAGNOSIS — Z853 Personal history of malignant neoplasm of breast: Secondary | ICD-10-CM | POA: Diagnosis not present

## 2012-01-18 ENCOUNTER — Other Ambulatory Visit: Payer: Self-pay

## 2012-01-18 MED ORDER — AMLODIPINE BESYLATE 10 MG PO TABS: 10.0000 mg | ORAL_TABLET | Freq: Every day | ORAL | Status: DC

## 2012-01-22 ENCOUNTER — Other Ambulatory Visit: Payer: Self-pay

## 2012-01-22 MED ORDER — FUROSEMIDE 40 MG PO TABS: 40.0000 mg | ORAL_TABLET | Freq: Every day | ORAL | Status: DC

## 2012-02-07 ENCOUNTER — Other Ambulatory Visit: Payer: Self-pay

## 2012-02-07 MED ORDER — AMLODIPINE BESYLATE 10 MG PO TABS: 10.0000 mg | ORAL_TABLET | Freq: Every day | ORAL | Status: DC

## 2012-02-29 ENCOUNTER — Other Ambulatory Visit (HOSPITAL_BASED_OUTPATIENT_CLINIC_OR_DEPARTMENT_OTHER): Payer: BC Managed Care – PPO | Admitting: Lab

## 2012-02-29 DIAGNOSIS — C50919 Malignant neoplasm of unspecified site of unspecified female breast: Secondary | ICD-10-CM

## 2012-02-29 LAB — COMPREHENSIVE METABOLIC PANEL (CC13)
ALT: 13 U/L (ref 0–55)
AST: 17 U/L (ref 5–34)
Alkaline Phosphatase: 100 U/L (ref 40–150)
CO2: 26 mEq/L (ref 22–29)
Sodium: 138 mEq/L (ref 136–145)
Total Bilirubin: 0.3 mg/dL (ref 0.20–1.20)
Total Protein: 7.4 g/dL (ref 6.4–8.3)

## 2012-02-29 LAB — CBC WITH DIFFERENTIAL/PLATELET
BASO%: 0.3 % (ref 0.0–2.0)
EOS%: 2.1 % (ref 0.0–7.0)
LYMPH%: 21.5 % (ref 14.0–49.7)
MCHC: 33.1 g/dL (ref 31.5–36.0)
MCV: 85.6 fL (ref 79.5–101.0)
MONO%: 7.9 % (ref 0.0–14.0)
Platelets: 238 10*3/uL (ref 145–400)
RBC: 3.98 10*6/uL (ref 3.70–5.45)
RDW: 15.1 % — ABNORMAL HIGH (ref 11.2–14.5)
WBC: 8.3 10*3/uL (ref 3.9–10.3)

## 2012-03-07 ENCOUNTER — Ambulatory Visit (HOSPITAL_BASED_OUTPATIENT_CLINIC_OR_DEPARTMENT_OTHER): Payer: BC Managed Care – PPO | Admitting: Oncology

## 2012-03-07 VITALS — BP 150/80 | HR 65 | Temp 98.6°F | Resp 20 | Ht 64.0 in | Wt 194.0 lb

## 2012-03-07 DIAGNOSIS — Z853 Personal history of malignant neoplasm of breast: Secondary | ICD-10-CM

## 2012-03-07 DIAGNOSIS — C50519 Malignant neoplasm of lower-outer quadrant of unspecified female breast: Secondary | ICD-10-CM | POA: Diagnosis not present

## 2012-03-07 DIAGNOSIS — Z170 Estrogen receptor positive status [ER+]: Secondary | ICD-10-CM | POA: Diagnosis not present

## 2012-03-07 DIAGNOSIS — C50919 Malignant neoplasm of unspecified site of unspecified female breast: Secondary | ICD-10-CM

## 2012-03-07 MED ORDER — LETROZOLE 2.5 MG PO TABS: 2.5000 mg | ORAL_TABLET | Freq: Every day | ORAL | Status: DC

## 2012-03-07 NOTE — Progress Notes (Signed)
ID: Victoria Hess   DOB: April 21, 1946  MR#: [id number]  WUJ#:[id number]  HISTORY OF PRESENT ILLNESS: The patient has a history of prior right lumpectomy and radiation therapy (2002) for what by her account was a Stage 0 ductal carcinoma in situ.  More recently, she had diagnostic mammography at [facility] on December 31, 2009 and this showed group calcifications in the left breast felt to be moderately suspicious for malignancy.  The patient was set up for stereotactic biopsy 01/04/2010 and the pathology report [id number] at the [facility]) showed a small focus of invasive adenocarcinoma, grade 1, with some evidence of in situ carcinoma.  The tumor was estrogen receptor positive at 100%, progesterone receptor positive at 100% and there was no Her-2 amplification by the Hercept test with a score of 1+.    Bilateral breast MRIs were performed February 04, 2010.  There was some postoperative change in the upper outer quadrant of the right breast with no suspicious findings.  In the left breast, lower outer quadrant, there was an irregular spiculated enhancing mass measuring 2.3 cm maximally.  There were no other masses associated with this and there were no suspicious internal mammary or axillary lymph nodes noted.    With this information, the patient was referred to Dr. Dwain Sarna and he set her up for genetic testing which came back favorable (no BRCA-1 or BRCA-2 mutations detected).  He then set her up for lumpectomy and sentinel lymph node dissection which was performed March 07, 2010.  The final pathology from that procedure ([id number]) showed a Grade 1 invasive ductal carcinoma measuring 1.5 cm with margins negative and ample.  There was no lymphovascular invasion.  One of four sentinel lymph nodes sampled had a micrometastatic deposit.  Repeat prognostic panel showed the tumor to be ER positive at 100%, PR positive at 66% and showed no Her-2 amplification by CISH with a  ratio of 1.24. Her subsequent history is as detailed below  INTERVAL HISTORY: Jeanne returns today for routine followup of her breast cancer. She is doing "great". 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 Family and particularly HER-2 grandchildren are doing "fine".  REVIEW OF SYSTEMS: She is tolerating the letrozole with no side effects that she is aware of, and in particular she is having significant hot flashes, vaginal dryness, or arthralgia/myalgia symptoms. Aside from problems with her glasses and dentures, a detailed review of systems today was otherwise entirely negative.  PAST MEDICAL HISTORY: Past Medical History  Diagnosis Date  . Hypertension   . Wears glasses   . Wears dentures     partial  . Cancer     Stage 2 breast cancer    PAST SURGICAL HISTORY: Past Surgical History  Procedure Date  . Tubal ligation   . Appendectomy   . Breast surgery     lumpectomy sentinel  node biopsy    FAMILY HISTORY Family History  Problem Relation Age of Onset  . Diabetes Mother   . Heart disease Mother   . Diabetes Father   . Cancer Father   . Diabetes Sister   . Hyperlipidemia Sister   . Cancer Sister     breast     GYNECOLOGIC HISTORY: The patient is GX P1, first pregnancy to term at age 48. She went through the change of life around the year 2000.  She did not receive hormone replacement.  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  Her husband of 44+ years, Les Pou, works in Surveyor, quantity.  Daughter Albin Felling works as a temp.  The patient has two granddaughters, aged 61 and 41.  She is a Control and instrumentation engineer    ADVANCED DIRECTIVES: not in place  HEALTH MAINTENANCE: History  Substance Use Topics  . Smoking status: Never Smoker   . Smokeless tobacco: Never Used  . Alcohol Use: No     Colonoscopy:  PAP:  Bone density:2013/ normal  Lipid panel:  No Known Allergies  Current Outpatient  Prescriptions  Medication Sig Dispense Refill  . amLODipine (NORVASC) 10 MG tablet Take 1 tablet (10 mg total) by mouth daily.  30 tablet  5  . aspirin 81 MG tablet Take 81 mg by mouth daily.        . Calcium Carbonate (CALCIUM 600 PO) Take by mouth daily.        . Cholecalciferol (VITAMIN D PO) Take 2,000 mg by mouth daily.        . furosemide (LASIX) 40 MG tablet Take 1 tablet (40 mg total) by mouth daily.  30 tablet  5  . letrozole (FEMARA) 2.5 MG tablet Take 1 tablet (2.5 mg total) by mouth daily.  90 tablet  3  . levothyroxine (SYNTHROID, LEVOTHROID) 50 MCG tablet Take 1 tablet (50 mcg total) by mouth daily.  30 tablet  4  . Multiple Vitamin (MULTIVITAMIN PO) Take by mouth daily.        . Nebivolol HCl (BYSTOLIC) 20 MG TABS Take by mouth daily.        . potassium chloride SA (K-DUR,KLOR-CON) 20 MEQ tablet Take 1 tablet (20 mEq total) by mouth 2 (two) times daily.  60 tablet  5  . ALPRAZolam (XANAX) 0.25 MG tablet Take 0.25 mg by mouth 2 (two) times daily as needed.        OBJECTIVE: Middle-aged [place] American woman in no acute distress Filed Vitals:   03/07/12 1609  BP: 150/80  Pulse: 65  Temp: 98.6 F (37 C)  Resp: 20     Body mass index is 33.30 kg/(m^2).    ECOG FS: 0  Sclerae unicteric Oropharynx clear No cervical or supraclavicular adenopathy Lungs no rales or rhonchi Heart regular rate and rhythm Abd benign MSK no focal spinal tenderness, no peripheral edema Neuro: nonfocal Breasts: The right breast is status post remote lumpectomy. There are no suspicious masses, skin changes, or nipple retraction. The left breast is status post lumpectomy and radiation. There is still some hyperpigmentation and skin edema, but these are expected changes. There are no findings to suggest local recurrence. Both axillae are benign.  LAB RESULTS: Lab Results  Component Value Date   WBC 8.3 02/29/2012   NEUTROABS 5.7 02/29/2012   HGB 11.3* 02/29/2012   HCT 34.1* 02/29/2012   MCV  85.6 02/29/2012   PLT 238 02/29/2012      Chemistry      Component Value Date/Time   NA 138 02/29/2012 1517   NA 143 11/28/2011 1159   K 3.9 02/29/2012 1517   K 4.3 11/28/2011 1159   CL 102 02/29/2012 1517   CL 103 11/28/2011 1159   CO2 26 02/29/2012 1517   CO2 29 11/28/2011 1159   BUN 18.0 02/29/2012 1517   BUN 13 11/28/2011 1159   CREATININE 0.9 02/29/2012 1517   CREATININE 0.89 11/28/2011 1159   CREATININE 0.86 08/16/2011 1358      Component Value Date/Time  CALCIUM 10.4 02/29/2012 1517   CALCIUM 10.1 11/28/2011 1159   ALKPHOS 100 02/29/2012 1517   ALKPHOS 87 08/16/2011 1358   AST 17 02/29/2012 1517   AST 18 08/16/2011 1358   ALT 13 02/29/2012 1517   ALT 18 08/16/2011 1358   BILITOT 0.30 02/29/2012 1517   BILITOT 0.3 08/16/2011 1358       Lab Results  Component Value Date   LABCA2 34 02/29/2012   Ferritin within normal range at 107 on 08/16/2011.   STUDIES: Bone density 01/04/2012 was normal as was mammography on the same date  ASSESSMENT:  66 year old [place] woman: 1. Status post right lumpectomy 2002 for what likely was a ductal carcinoma in situ, treated with radiation under [facility]. 2. Status post left lumpectomy October 2011 for a T1c N1 (mic) stage IB invasive ductal carcinoma, grade 1, strongly estrogen and progesterone receptor positive, HER2/neu negative.  Margins were ample. 3.  radiation therapy completed in January 2012 4. she started letrozole January of 2012.    PLAN: Eudelia is doing fine as far as her breast cancer is concerned. Now 2 years out from her surgery, I feel comfortable seeing her on a once a year basis, as that she gets her mammograms in August, we will see her in September. The plan is to continue letrozole for a total of 5 years, then reassess. She knows to call for any problems that may develop before then.   MAGRINAT,GUSTAV C    03/07/2012

## 2012-03-08 ENCOUNTER — Telehealth: Payer: Self-pay | Admitting: Oncology

## 2012-03-08 NOTE — Telephone Encounter (Signed)
lvm for pt regarding Sept 2014 appt.....Marland Kitchenmailed appt schedule for Sept 2014 to pt.

## 2012-03-26 ENCOUNTER — Other Ambulatory Visit: Payer: Self-pay

## 2012-03-26 MED ORDER — NEBIVOLOL HCL 20 MG PO TABS: 20.0000 mg | ORAL_TABLET | Freq: Every day | ORAL | Status: DC

## 2012-04-05 ENCOUNTER — Ambulatory Visit (INDEPENDENT_AMBULATORY_CARE_PROVIDER_SITE_OTHER): Payer: BC Managed Care – PPO | Admitting: Internal Medicine

## 2012-04-05 ENCOUNTER — Encounter: Payer: Self-pay | Admitting: Internal Medicine

## 2012-04-05 VITALS — BP 160/90 | HR 52 | Temp 98.6°F | Wt 195.0 lb

## 2012-04-05 DIAGNOSIS — I10 Essential (primary) hypertension: Secondary | ICD-10-CM | POA: Diagnosis not present

## 2012-04-05 DIAGNOSIS — E785 Hyperlipidemia, unspecified: Secondary | ICD-10-CM

## 2012-04-05 DIAGNOSIS — Z853 Personal history of malignant neoplasm of breast: Secondary | ICD-10-CM | POA: Diagnosis not present

## 2012-04-05 DIAGNOSIS — E669 Obesity, unspecified: Secondary | ICD-10-CM

## 2012-04-05 DIAGNOSIS — E039 Hypothyroidism, unspecified: Secondary | ICD-10-CM | POA: Diagnosis not present

## 2012-04-05 DIAGNOSIS — E119 Type 2 diabetes mellitus without complications: Secondary | ICD-10-CM

## 2012-04-05 DIAGNOSIS — F4321 Adjustment disorder with depressed mood: Secondary | ICD-10-CM

## 2012-04-06 LAB — BASIC METABOLIC PANEL
BUN: 17 mg/dL (ref 6–23)
Calcium: 10.3 mg/dL (ref 8.4–10.5)
Creat: 1.01 mg/dL (ref 0.50–1.10)
Glucose, Bld: 80 mg/dL (ref 70–99)

## 2012-04-06 NOTE — Patient Instructions (Addendum)
Continue same medications and return in 4-6 months for physical exam. 

## 2012-04-06 NOTE — Progress Notes (Signed)
  Subjective:    Patient ID: Victoria Hess, female    DOB: 1945/07/20, 66 y.o.   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 Blood pressure is elevated today at 160/90 but she has told me today that one of her friends is very ill in the hospital and several individuals that she knows have passed away recently. Denies being depressed about this just use was upsetting to her. She does bring in multiple blood pressure readings that are very acceptable over the past 3 weeks. Mostly systolic readings are 117-144 and diastolic readings are 68 to 80. These are very acceptable readings. She is on diuretic therapy. Serum potassium will be checked today. She also takes potassium supplement.    Review of Systems     Objective:   Physical Exam chest clear to auscultation, cardiac exam regular rate and rhythm normal S1 and S2, extremities without edema, skin is warm and dry, affect is appropriate        Assessment & Plan:  Hypertension-stable  Grief reaction-appropriate  Type 2 diabetes mellitus diet controlled  Hypothyroidism-on thyroid replacement therapy  History of breast cancer-recent checkup by Dr. Darnelle Catalan was reassuring to her

## 2012-04-25 ENCOUNTER — Other Ambulatory Visit: Payer: Self-pay

## 2012-04-25 MED ORDER — NEBIVOLOL HCL 20 MG PO TABS: 20.0000 mg | ORAL_TABLET | Freq: Every day | ORAL | Status: DC

## 2012-04-25 MED ORDER — LEVOTHYROXINE SODIUM 50 MCG PO TABS: 50.0000 ug | ORAL_TABLET | Freq: Every day | ORAL | Status: DC

## 2012-06-07 ENCOUNTER — Telehealth: Payer: Self-pay

## 2012-06-07 NOTE — Telephone Encounter (Signed)
Opened in error

## 2012-06-10 ENCOUNTER — Other Ambulatory Visit: Payer: Self-pay

## 2012-06-10 MED ORDER — POTASSIUM CHLORIDE CRYS ER 20 MEQ PO TBCR: 20.0000 meq | EXTENDED_RELEASE_TABLET | Freq: Two times a day (BID) | ORAL | Status: DC

## 2012-07-23 ENCOUNTER — Other Ambulatory Visit: Payer: Self-pay

## 2012-07-23 MED ORDER — FUROSEMIDE 40 MG PO TABS: 40.0000 mg | ORAL_TABLET | Freq: Every day | ORAL | Status: DC

## 2012-08-27 ENCOUNTER — Other Ambulatory Visit: Payer: Medicare Other | Admitting: Internal Medicine

## 2012-08-27 DIAGNOSIS — E119 Type 2 diabetes mellitus without complications: Secondary | ICD-10-CM

## 2012-08-27 DIAGNOSIS — E039 Hypothyroidism, unspecified: Secondary | ICD-10-CM

## 2012-08-27 DIAGNOSIS — E559 Vitamin D deficiency, unspecified: Secondary | ICD-10-CM | POA: Diagnosis not present

## 2012-08-27 DIAGNOSIS — Z0000 Encounter for general adult medical examination without abnormal findings: Secondary | ICD-10-CM

## 2012-08-27 LAB — COMPREHENSIVE METABOLIC PANEL
ALT: 12 U/L (ref 0–35)
AST: 16 U/L (ref 0–37)
Alkaline Phosphatase: 92 U/L (ref 39–117)
Calcium: 10.1 mg/dL (ref 8.4–10.5)
Chloride: 101 mEq/L (ref 96–112)
Creat: 0.93 mg/dL (ref 0.50–1.10)
Potassium: 4.2 mEq/L (ref 3.5–5.3)

## 2012-08-27 LAB — CBC WITH DIFFERENTIAL/PLATELET
Basophils Absolute: 0 10*3/uL (ref 0.0–0.1)
Basophils Relative: 1 % (ref 0–1)
Lymphocytes Relative: 27 % (ref 12–46)
MCHC: 33.6 g/dL (ref 30.0–36.0)
Neutro Abs: 3.6 10*3/uL (ref 1.7–7.7)
Platelets: 280 10*3/uL (ref 150–400)
RDW: 14.8 % (ref 11.5–15.5)
WBC: 5.7 10*3/uL (ref 4.0–10.5)

## 2012-08-27 LAB — LIPID PANEL
LDL Cholesterol: 114 mg/dL — ABNORMAL HIGH (ref 0–99)
VLDL: 17 mg/dL (ref 0–40)

## 2012-08-27 LAB — TSH: TSH: 1.326 u[IU]/mL (ref 0.350–4.500)

## 2012-08-27 LAB — HEMOGLOBIN A1C: Hgb A1c MFr Bld: 6.2 % — ABNORMAL HIGH (ref <5.7)

## 2012-08-28 LAB — VITAMIN D 25 HYDROXY (VIT D DEFICIENCY, FRACTURES): Vit D, 25-Hydroxy: 60 ng/mL (ref 30–89)

## 2012-08-29 ENCOUNTER — Encounter: Payer: Self-pay | Admitting: Internal Medicine

## 2012-08-29 ENCOUNTER — Ambulatory Visit (INDEPENDENT_AMBULATORY_CARE_PROVIDER_SITE_OTHER): Payer: Medicare Other | Admitting: Internal Medicine

## 2012-08-29 VITALS — BP 136/84 | HR 60 | Temp 98.3°F | Ht 63.75 in | Wt 185.0 lb

## 2012-08-29 DIAGNOSIS — I10 Essential (primary) hypertension: Secondary | ICD-10-CM | POA: Diagnosis not present

## 2012-08-29 DIAGNOSIS — E119 Type 2 diabetes mellitus without complications: Secondary | ICD-10-CM

## 2012-08-29 DIAGNOSIS — Z853 Personal history of malignant neoplasm of breast: Secondary | ICD-10-CM | POA: Diagnosis not present

## 2012-08-29 DIAGNOSIS — E669 Obesity, unspecified: Secondary | ICD-10-CM

## 2012-08-29 DIAGNOSIS — Z0000 Encounter for general adult medical examination without abnormal findings: Secondary | ICD-10-CM | POA: Diagnosis not present

## 2012-08-29 DIAGNOSIS — E1169 Type 2 diabetes mellitus with other specified complication: Secondary | ICD-10-CM

## 2012-09-03 ENCOUNTER — Ambulatory Visit (INDEPENDENT_AMBULATORY_CARE_PROVIDER_SITE_OTHER): Payer: Managed Care, Other (non HMO) | Admitting: General Surgery

## 2012-09-03 ENCOUNTER — Encounter (INDEPENDENT_AMBULATORY_CARE_PROVIDER_SITE_OTHER): Payer: Self-pay | Admitting: General Surgery

## 2012-09-03 VITALS — BP 120/74 | HR 52 | Temp 97.6°F | Resp 18 | Ht 63.5 in | Wt 184.0 lb

## 2012-09-03 DIAGNOSIS — Z853 Personal history of malignant neoplasm of breast: Secondary | ICD-10-CM

## 2012-09-03 NOTE — Progress Notes (Signed)
Subjective:     Patient ID: Victoria Hess, female   DOB: Jul 13, 1945, 67 y.o.   MRN: [id number]  HPI This is a 67 year old female who underwent a right lumpectomy with radiation therapy in 2002 at [facility] for ductal carcinoma in situ. More recently in October of 2011 she had a left lumpectomy and a sentinel lymph node biopsy for a T1C N55mic tumor. This was followed by radiation therapy and she is now on letrozole for which she is tolerating well. She had her mammogram in August it was a BI-RADS 2 and recommended for followup in one year. She has no complaints referable to either breast. She has no symptoms of lymphedema at all. She is working and is doing very well overall.  Review of Systems     Objective:   Physical Exam  Vitals reviewed. Constitutional: She appears well-developed and well-nourished.  Pulmonary/Chest: Right breast exhibits no inverted nipple, no mass, no nipple discharge, no skin change and no tenderness. Left breast exhibits skin change (c/w xrt). Left breast exhibits no inverted nipple, no mass, no nipple discharge and no tenderness.    Lymphadenopathy:    She has no cervical adenopathy.    She has no axillary adenopathy.       Right: No supraclavicular adenopathy present.       Left: No supraclavicular adenopathy present.       Assessment:     S/p bilateral breast cancer     Plan:     She has no clinical evidence of recurrence. She is due to get her mammogram in one year and we'll continue her monthly self-examinations. She will continue her Femara. I will plan on seeing her back in one year or sooner if needed. She will follow up with medical oncology in 6 months.

## 2012-10-08 ENCOUNTER — Other Ambulatory Visit: Payer: Self-pay

## 2012-10-08 MED ORDER — AMLODIPINE BESYLATE 10 MG PO TABS: 10.0000 mg | ORAL_TABLET | Freq: Every day | ORAL | Status: DC

## 2012-11-18 ENCOUNTER — Other Ambulatory Visit: Payer: Self-pay

## 2012-11-18 MED ORDER — LEVOTHYROXINE SODIUM 50 MCG PO TABS: 50.0000 ug | ORAL_TABLET | Freq: Every day | ORAL | Status: DC

## 2012-11-18 MED ORDER — NEBIVOLOL HCL 20 MG PO TABS: 20.0000 mg | ORAL_TABLET | Freq: Every day | ORAL | Status: DC

## 2012-11-25 ENCOUNTER — Other Ambulatory Visit (INDEPENDENT_AMBULATORY_CARE_PROVIDER_SITE_OTHER): Payer: Self-pay | Admitting: General Surgery

## 2012-11-25 DIAGNOSIS — Z853 Personal history of malignant neoplasm of breast: Secondary | ICD-10-CM

## 2013-01-20 ENCOUNTER — Other Ambulatory Visit: Payer: Self-pay

## 2013-01-20 MED ORDER — POTASSIUM CHLORIDE CRYS ER 20 MEQ PO TBCR: 20.0000 meq | EXTENDED_RELEASE_TABLET | Freq: Two times a day (BID) | ORAL | Status: DC

## 2013-01-22 ENCOUNTER — Telehealth: Payer: Self-pay | Admitting: Oncology

## 2013-01-22 ENCOUNTER — Other Ambulatory Visit: Payer: Self-pay | Admitting: Physician Assistant

## 2013-01-22 DIAGNOSIS — C50919 Malignant neoplasm of unspecified site of unspecified female breast: Secondary | ICD-10-CM

## 2013-01-22 NOTE — Telephone Encounter (Signed)
Returned pt's call re cx and r/s 9/11 appt. Pt needs latest appt poss due to work and was made aware that 2:45pm for lb/AB is the latest on any given day. 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 Pt given my direct #.

## 2013-01-23 ENCOUNTER — Other Ambulatory Visit: Payer: BC Managed Care – PPO | Admitting: Lab

## 2013-01-23 ENCOUNTER — Encounter: Payer: Self-pay | Admitting: Physician Assistant

## 2013-01-23 ENCOUNTER — Ambulatory Visit: Payer: BC Managed Care – PPO | Admitting: Physician Assistant

## 2013-01-23 NOTE — Progress Notes (Signed)
Per phone note on 01/22/2013, patient called to cancel today's appointment and will call to reschedule. Accordingly, I have not sent an FTKA letter.  Zollie Scale, PA-C 01/23/2013

## 2013-01-29 ENCOUNTER — Ambulatory Visit
Admission: RE | Admit: 2013-01-29 | Discharge: 2013-01-29 | Disposition: A | Discharge status: Home / Self Care | Payer: Commercial Indemnity | Source: Ambulatory Visit | Attending: General Surgery | Admitting: General Surgery

## 2013-01-29 DIAGNOSIS — Z853 Personal history of malignant neoplasm of breast: Secondary | ICD-10-CM

## 2013-02-12 ENCOUNTER — Telehealth: Payer: Self-pay | Admitting: Oncology

## 2013-02-12 NOTE — Telephone Encounter (Signed)
Pt called today to r/s 9/11 appt and was given new appt d/t for 10/27 lb/AB @ 1:15pm.

## 2013-02-13 ENCOUNTER — Encounter: Payer: Self-pay | Admitting: Internal Medicine

## 2013-02-13 NOTE — Progress Notes (Signed)
  Subjective:    Patient ID: Victoria Hess, female    DOB: [birth year], 67 y.o.   MRN: [id number]  HPI 67 year old Black female with history of right breast cancer diagnosed in 1992, left breast cancer diagnosed in 2011. She is followed by Dr. Darnelle Catalan. History of hypertension and obesity. However, she's lost 21 pounds since her last visit.  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 She is married. Lives in [place]. 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  Tonsillectomy and adenoidectomy in 1955, appendectomy and tubal ligation 1977.  She had bilateral ductal carcinoma in situ. She had a wide excision procedure of the left breast in 2011 and a lesser procedure done on the right breast in 2002.  History of uterine fibroids, cystocele, uterine prolapse.  She has a history of hyperlipidemia with elevated LDL cholesterol and normal triglycerides.  Patient does not smoke or consume alcohol. Husband is in good health. Chest and adult daughter.  Family history: Father died at age 22 of a stroke. Mother died from coronary artery disease. One brother age 24 with history of hypertension and diabetes, one sister with hypertension and diabetes, another sister with hypertension, one sister in good health without medical problems.    Review of Systems  Constitutional: Positive for fatigue.  All other systems reviewed and are negative.       Objective:   Physical Exam  Constitutional: She is oriented to person, place, and time. She appears well-developed and well-nourished. No distress.  HENT:  Head: Normocephalic and atraumatic.  Right Ear: External ear normal.  Left Ear: External ear normal.  Mouth/Throat: Oropharynx is clear and moist. No oropharyngeal exudate.  Eyes: Conjunctivae are normal. Pupils are equal, round, and reactive to light. Right eye exhibits no discharge. Left eye exhibits no  discharge.  Neck: Neck supple. No JVD present. No thyromegaly present.  Cardiovascular: Normal rate, regular rhythm, normal heart sounds and intact distal pulses.   No murmur heard. Pulmonary/Chest: Effort normal and breath sounds normal. She has no wheezes. She has no rales.  Abdominal: Soft. Bowel sounds are normal. She exhibits no distension and no mass. There is no tenderness. There is no rebound.  Genitourinary:  Pap done 2013. Bimanual exam is normal.  Musculoskeletal: Normal range of motion. She exhibits no edema.  Lymphadenopathy:    She has no cervical adenopathy.  Neurological: She is alert and oriented to person, place, and time. She has normal reflexes. No cranial nerve deficit. Coordination normal.  Skin: Skin is warm and dry. No rash noted. She is not diaphoretic.  Psychiatric: She has a normal mood and affect. Her behavior is normal. Judgment and thought content normal.          Assessment & Plan:  Hypertension-stable  History of breast cancer-under care of Dr. Darnelle Catalan is on Femara  Obesity-has lost 21 pounds with diet and exercise since last visit  Controlled type 2 diabetes mellitus -Hemoglobin A1c 6.2%. Continue diet exercise. Recheck in 6 months.  Plan: Return in 6 months for office visit and blood pressure check, hemoglobin A1c

## 2013-02-14 NOTE — Patient Instructions (Addendum)
Continue same medications and return in 6 months. Keep up diet exercise and weight loss. I am pleased with her progress.

## 2013-03-04 ENCOUNTER — Other Ambulatory Visit: Payer: Managed Care, Other (non HMO) | Admitting: Internal Medicine

## 2013-03-04 DIAGNOSIS — E119 Type 2 diabetes mellitus without complications: Secondary | ICD-10-CM

## 2013-03-04 DIAGNOSIS — E039 Hypothyroidism, unspecified: Secondary | ICD-10-CM

## 2013-03-04 DIAGNOSIS — E58 Dietary calcium deficiency: Secondary | ICD-10-CM

## 2013-03-04 DIAGNOSIS — Z0000 Encounter for general adult medical examination without abnormal findings: Secondary | ICD-10-CM

## 2013-03-04 DIAGNOSIS — Z13220 Encounter for screening for lipoid disorders: Secondary | ICD-10-CM

## 2013-03-04 LAB — HEMOGLOBIN A1C
Hgb A1c MFr Bld: 6.3 % — ABNORMAL HIGH (ref <5.7)
Mean Plasma Glucose: 134 mg/dL — ABNORMAL HIGH (ref <117)

## 2013-03-04 LAB — BASIC METABOLIC PANEL WITH GFR
BUN: 14 mg/dL (ref 6–23)
CO2: 31 meq/L (ref 19–32)
Calcium: 10.2 mg/dL (ref 8.4–10.5)
Chloride: 99 meq/L (ref 96–112)
Creat: 0.9 mg/dL (ref 0.50–1.10)
Glucose, Bld: 113 mg/dL — ABNORMAL HIGH (ref 70–99)
Potassium: 4.3 meq/L (ref 3.5–5.3)
Sodium: 139 meq/L (ref 135–145)

## 2013-03-04 LAB — TSH: TSH: 1.851 u[IU]/mL (ref 0.350–4.500)

## 2013-03-04 LAB — LIPID PANEL
Total CHOL/HDL Ratio: 3.1 Ratio
VLDL: 17 mg/dL (ref 0–40)

## 2013-03-04 LAB — CALCIUM: Calcium: 10.2 mg/dL (ref 8.4–10.5)

## 2013-03-06 ENCOUNTER — Encounter: Payer: Self-pay | Admitting: Internal Medicine

## 2013-03-06 ENCOUNTER — Ambulatory Visit (INDEPENDENT_AMBULATORY_CARE_PROVIDER_SITE_OTHER): Payer: Managed Care, Other (non HMO) | Admitting: Internal Medicine

## 2013-03-06 VITALS — BP 170/80 | HR 60 | Temp 98.0°F | Wt 191.0 lb

## 2013-03-06 DIAGNOSIS — I10 Essential (primary) hypertension: Secondary | ICD-10-CM | POA: Diagnosis not present

## 2013-03-06 DIAGNOSIS — E119 Type 2 diabetes mellitus without complications: Secondary | ICD-10-CM

## 2013-03-06 DIAGNOSIS — E039 Hypothyroidism, unspecified: Secondary | ICD-10-CM | POA: Diagnosis not present

## 2013-03-06 DIAGNOSIS — Z23 Encounter for immunization: Secondary | ICD-10-CM

## 2013-03-06 DIAGNOSIS — E785 Hyperlipidemia, unspecified: Secondary | ICD-10-CM

## 2013-03-06 MED ORDER — SIMVASTATIN 10 MG PO TABS: 10.0000 mg | ORAL_TABLET | Freq: Every day | ORAL | Status: DC

## 2013-03-06 NOTE — Patient Instructions (Addendum)
Start Zocor 10 mg daily and RTC in 3 months. Continue same medications otherwise.

## 2013-03-06 NOTE — Progress Notes (Signed)
  Subjective:    Patient ID: Victoria Hess, female    DOB: 03/01/1946, 67 y.o.   MRN: [id number]  HPI  67 year old Black female for 6 month follow up of DM, hyperlipidemia, hypothyroidism and HTN. BP elevated today because of bad day at work. Influenza immunization given today. Hemoglobin A1c is 6.3% and previously was 6.2%. Fasting lipid panel shows total cholesterol of 215 and previously was 194. LDL cholesterol has increased from 114-128. We are monitoring her calcium. Calcium is now 10.2. Highest calcium noted was 10.4 in October 2013. TSH is within normal limits.    Review of Systems     Objective:   Physical Exam Skin is warm and dry. Neck supple without JVD thyromegaly or carotid bruits. Chest clear to auscultation. Cardiac exam regular rate and rhythm normal S1 and S2. Extremities without edema. Diabetic foot exam without calluses or ulcerations.       Assessment & Plan:  Hyperlipidemia-start Zocor 10 mg daily and return in 3 months  Hypertension-patient says blood pressures elevated due to a bad day at work. Continue to monitor at home and let me know if it is not well controlled.  Controlled type 2 diabetes mellitus  Obesity-encouraged diet and exercise  Hypothyroidism-TSH stable on current dose of Synthroid- recheck in 6 months-at which time she'll be due for physical examination  Serum calcium-continue to monitor. Thus far, levels fall within normal limits

## 2013-03-10 ENCOUNTER — Other Ambulatory Visit (HOSPITAL_BASED_OUTPATIENT_CLINIC_OR_DEPARTMENT_OTHER): Payer: Commercial Indemnity | Admitting: Lab

## 2013-03-10 ENCOUNTER — Encounter: Payer: Self-pay | Admitting: Physician Assistant

## 2013-03-10 ENCOUNTER — Ambulatory Visit (HOSPITAL_BASED_OUTPATIENT_CLINIC_OR_DEPARTMENT_OTHER): Payer: Commercial Indemnity | Admitting: Physician Assistant

## 2013-03-10 VITALS — BP 169/98 | HR 65 | Temp 98.6°F | Resp 20 | Ht 63.5 in | Wt 188.6 lb

## 2013-03-10 DIAGNOSIS — Z780 Asymptomatic menopausal state: Secondary | ICD-10-CM

## 2013-03-10 DIAGNOSIS — C50911 Malignant neoplasm of unspecified site of right female breast: Secondary | ICD-10-CM

## 2013-03-10 DIAGNOSIS — I10 Essential (primary) hypertension: Secondary | ICD-10-CM

## 2013-03-10 DIAGNOSIS — C50919 Malignant neoplasm of unspecified site of unspecified female breast: Secondary | ICD-10-CM

## 2013-03-10 DIAGNOSIS — C50912 Malignant neoplasm of unspecified site of left female breast: Secondary | ICD-10-CM

## 2013-03-10 DIAGNOSIS — Z853 Personal history of malignant neoplasm of breast: Secondary | ICD-10-CM

## 2013-03-10 DIAGNOSIS — C50519 Malignant neoplasm of lower-outer quadrant of unspecified female breast: Secondary | ICD-10-CM

## 2013-03-10 LAB — COMPREHENSIVE METABOLIC PANEL (CC13)
Albumin: 3.9 g/dL (ref 3.5–5.0)
Anion Gap: 12 mEq/L — ABNORMAL HIGH (ref 3–11)
CO2: 25 mEq/L (ref 22–29)
Glucose: 92 mg/dl (ref 70–140)
Potassium: 4 mEq/L (ref 3.5–5.1)
Sodium: 141 mEq/L (ref 136–145)
Total Protein: 7.8 g/dL (ref 6.4–8.3)

## 2013-03-10 LAB — CBC WITH DIFFERENTIAL/PLATELET
Eosinophils Absolute: 0.2 10*3/uL (ref 0.0–0.5)
MONO#: 0.6 10*3/uL (ref 0.1–0.9)
NEUT#: 4.4 10*3/uL (ref 1.5–6.5)
RBC: 4.17 10*6/uL (ref 3.70–5.45)
RDW: 14 % (ref 11.2–14.5)
WBC: 7.1 10*3/uL (ref 3.9–10.3)
lymph#: 1.9 10*3/uL (ref 0.9–3.3)

## 2013-03-10 MED ORDER — LETROZOLE 2.5 MG PO TABS: 2.5000 mg | ORAL_TABLET | Freq: Every day | ORAL | Status: DC

## 2013-03-10 NOTE — Progress Notes (Signed)
ID: Victoria Hess   DOB: 1945-07-31  MR#: [id number]  CSN#:[id number]   PCP:  Margaree Mackintosh, MD GYN: SUREmelia Loron, MD OTHER:   CHIEF COMPLAINT:  Left  Breast Cancer   HISTORY OF PRESENT ILLNESS: The patient has a history of prior right lumpectomy and radiation therapy (2002) for what by her account was a Stage 0 ductal carcinoma in situ.  More recently, she had diagnostic mammography at [facility] on December 31, 2009 and this showed group calcifications in the left breast felt to be moderately suspicious for malignancy.  The patient was set up for stereotactic biopsy 01/04/2010 and the pathology report [id number] at the [facility]) showed a small focus of invasive adenocarcinoma, grade 1, with some evidence of in situ carcinoma.  The tumor was estrogen receptor positive at 100%, progesterone receptor positive at 100% and there was no Her-2 amplification by the Hercept test with a score of 1+.    Bilateral breast MRIs were performed February 04, 2010.  There was some postoperative change in the upper outer quadrant of the right breast with no suspicious findings.  In the left breast, lower outer quadrant, there was an irregular spiculated enhancing mass measuring 2.3 cm maximally.  There were no other masses associated with this and there were no suspicious internal mammary or axillary lymph nodes noted.    With this information, the patient was referred to Dr. Dwain Sarna and he set her up for genetic testing which came back favorable (no BRCA-1 or BRCA-2 mutations detected).  He then set her up for lumpectomy and sentinel lymph node dissection which was performed March 07, 2010.  The final pathology from that procedure ([id number]) showed a Grade 1 invasive ductal carcinoma measuring 1.5 cm with margins negative and ample.  There was no lymphovascular invasion.  One of four sentinel lymph nodes sampled had a micrometastatic deposit.  Repeat prognostic  panel showed the tumor to be ER positive at 100%, PR positive at 66% and showed no Her-2 amplification by CISH with a ratio of 1.24. Her subsequent history is as detailed below  INTERVAL HISTORY: Navada returns today for routine followup of her left breast cancer. Interval history is generally unremarkable, and she is doing well. 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 She's been with the same girl since she started kindergarten. She continues on letrozole daily, with no significant side effects. In fact, overall she is doing "just fine".   REVIEW OF SYSTEMS: Koralynn  has had no recent illnesses and denies any fevers, chills, night sweats, or hot flashes. She's had no rashes, abnormal bruising, or abnormal bleeding. She denies any vaginal bleeding and has had no vaginal dryness. Her energy level is good. She admits the she's not exercise on a regular basis. She's eating well denies any nausea or change in bowel or bladder habits. She's had no cough, shortness of breath, chest pain, palpitations, or abnormal headaches. She denies any unusual myalgias, arthralgias, or bony pain, but does have some occasional back pain which is chronic and unchanged. She's had no peripheral swelling.  A detailed review of systems is otherwise stable and noncontributory.   PAST MEDICAL HISTORY: Past Medical History  Diagnosis Date  . Hypertension   . Wears glasses   . Wears dentures     partial  . Cancer     Stage 2 breast cancer    PAST SURGICAL HISTORY: Past Surgical History  Procedure Laterality  Date  . Tubal ligation    . Appendectomy    . Breast surgery      left lumpectomy sentinel  node biopsy  . Breast surgery      right lumpectomy    FAMILY HISTORY Family History  Problem Relation Age of Onset  . Diabetes Mother   . Heart disease Mother   . Diabetes Father   . Cancer Father   . Diabetes Sister   . Hyperlipidemia Sister   . Cancer Sister     breast      GYNECOLOGIC HISTORY: The patient is GX P1, first pregnancy to term at age 68. She went through the change of life around the year 2000.  She did not receive hormone replacement.  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  Her husband of 44+ years, Les Pou, works in Surveyor, quantity.  Daughter Albin Felling works as a temp.  The patient has two granddaughters, aged 48 and 49.  She is a Control and instrumentation engineer.    ADVANCED DIRECTIVES: not in place  HEALTH MAINTENANCE: (Updated 03/10/2013) History  Substance Use Topics  . Smoking status: Never Smoker   . Smokeless tobacco: Never Used  . Alcohol Use: No     Colonoscopy: UTD (Not on file)  PAP: Not on file  Bone density: 2013/ normal  Lipid panel: April 2014, Dr. Lenord Fellers  No Known Allergies  Current Outpatient Prescriptions  Medication Sig Dispense Refill  . amLODipine (NORVASC) 10 MG tablet Take 1 tablet (10 mg total) by mouth daily.  30 tablet  5  . aspirin 81 MG tablet Take 81 mg by mouth daily.        . Calcium Carbonate (CALCIUM 600 PO) Take by mouth daily.        . Cholecalciferol (VITAMIN D PO) Take 2,000 mg by mouth daily.        . furosemide (LASIX) 40 MG tablet Take 1 tablet (40 mg total) by mouth daily.  30 tablet  11  . letrozole (FEMARA) 2.5 MG tablet Take 1 tablet (2.5 mg total) by mouth daily.  90 tablet  3  . levothyroxine (SYNTHROID, LEVOTHROID) 50 MCG tablet Take 1 tablet (50 mcg total) by mouth daily.  30 tablet  3  . Multiple Vitamin (MULTIVITAMIN PO) Take by mouth daily.        . Nebivolol HCl (BYSTOLIC) 20 MG TABS Take 1 tablet (20 mg total) by mouth daily.  30 tablet  3  . potassium chloride SA (K-DUR,KLOR-CON) 20 MEQ tablet Take 1 tablet (20 mEq total) by mouth 2 (two) times daily.  60 tablet  5  . simvastatin (ZOCOR) 10 MG tablet Take 1 tablet (10 mg total) by mouth at bedtime.  90 tablet  3   No current facility-administered medications for this visit.    OBJECTIVE: Middle-aged  [place] woman who appears comfortable and is in no acute distress Filed Vitals:   03/10/13 1324  BP: 169/98  Pulse: 65  Temp: 98.6 F (37 C)  Resp: 20     Body mass index is 32.88 kg/(m^2).    ECOG FS: 0 Filed Weights   03/10/13 1324  Weight: 188 lb 9.6 oz (85.548 kg)   Physical Exam: HEENT:  Sclerae anicteric.  Oropharynx clear. Buccal mucosa is pink and moist NODES:  No cervical or supraclavicular lymphadenopathy palpated.  BREAST EXAM:  Right breast is status post remote lumpectomy with no evidence of local recurrence. Left  breast is status post lumpectomy and radiation therapy with some skin changes consistent with radiation. Otherwise, breast is unremarkable with no evidence of local recurrence. Axillae are benign bilaterally, no palpable lymphadenopathy. LUNGS:  Clear to auscultation bilaterally.  No wheezes or rhonchi HEART:  Regular rate and rhythm. No murmur  ABDOMEN:  Soft, nontender.  Positive bowel sounds.  MSK:  No focal spinal tenderness to palpation. Range of motion in the upper extremities. EXTREMITIES:  No peripheral edema.   NEURO:  Nonfocal. Well oriented.  Positive affect.    LAB RESULTS: Lab Results  Component Value Date   WBC 7.1 03/10/2013   NEUTROABS 4.4 03/10/2013   HGB 11.9 03/10/2013   HCT 36.0 03/10/2013   MCV 86.3 03/10/2013   PLT 247 03/10/2013      Chemistry      Component Value Date/Time   NA 141 03/10/2013 1301   NA 139 03/04/2013 0949   K 4.0 03/10/2013 1301   K 4.3 03/04/2013 0949   CL 99 03/04/2013 0949   CL 102 02/29/2012 1517   CO2 25 03/10/2013 1301   CO2 31 03/04/2013 0949   BUN 21.2 03/10/2013 1301   BUN 14 03/04/2013 0949   CREATININE 0.9 03/10/2013 1301   CREATININE 0.90 03/04/2013 0949   CREATININE 0.86 08/16/2011 1358      Component Value Date/Time   CALCIUM 10.0 03/10/2013 1301   CALCIUM 10.2 03/04/2013 0949   CALCIUM 10.2 03/04/2013 0949   ALKPHOS 103 03/10/2013 1301   ALKPHOS 92 08/27/2012 1045   AST 18  03/10/2013 1301   AST 16 08/27/2012 1045   ALT 13 03/10/2013 1301   ALT 12 08/27/2012 1045   BILITOT 0.23 03/10/2013 1301   BILITOT 0.4 08/27/2012 1045       Lab Results  Component Value Date   LABCA2 34 02/29/2012      STUDIES: [facility].  Most recent bilateral mammogram on 01/29/2013 was unremarkable.   ASSESSMENT:  67 year old [place] woman: 1. Status post right lumpectomy 2002 for what likely was a ductal carcinoma in situ, treated with radiation under [facility]. 2. Status post left lumpectomy October 2011 for a T1c N1 (mic) stage IB invasive ductal carcinoma, grade 1, strongly estrogen and progesterone receptor positive, HER2/neu negative.  Margins were ample. 3.  radiation therapy completed in January 2012 4. she started letrozole January of 2012, the goal being to continue for total of 5 years (until January 2017)   PLAN: Jamoni is doing very well, with no clinical evidence of disease recurrence. She's tolerating the letrozole well and I'm making no changes in her current regimen. We have refilled the letrozole for another year, and the plan is to continue for total of 5 years then reevaluate.  We will see the patient on an annual basis, and will alternate these appointments with her surgeon. Accordingly, she will be seeing Dr. Dwain Sarna in April. She'll have her mammogram and bone density in September of next year, and will return here for labs and physical exam in October. In the meanwhile, she'll be followed closely by Dr. Lenord Fellers for any additional health problems, including her hypertension.  Jaylie voices understanding and agreement with this plan, and knows to call with any changes or problems prior to her next appointment.   Julliana Whitmyer PA-C     03/10/2013

## 2013-03-13 ENCOUNTER — Telehealth: Payer: Self-pay | Admitting: Oncology

## 2013-03-13 NOTE — Telephone Encounter (Signed)
lmonvm advising the pt of her appts in April 2015 for the lab and the md visit along with the mammo/bone density appts in sept 2015. Pt will be getting a call from dr Doreen Salvage office with an appt. The is on a recall list with that office.

## 2013-04-02 ENCOUNTER — Other Ambulatory Visit: Payer: Self-pay | Admitting: *Deleted

## 2013-04-02 MED ORDER — AMLODIPINE BESYLATE 10 MG PO TABS: 10.0000 mg | ORAL_TABLET | Freq: Every day | ORAL | Status: DC

## 2013-04-21 ENCOUNTER — Other Ambulatory Visit: Payer: Self-pay | Admitting: *Deleted

## 2013-04-21 MED ORDER — LEVOTHYROXINE SODIUM 50 MCG PO TABS: 50.0000 ug | ORAL_TABLET | Freq: Every day | ORAL | Status: DC

## 2013-04-21 MED ORDER — NEBIVOLOL HCL 20 MG PO TABS: 20.0000 mg | ORAL_TABLET | Freq: Every day | ORAL | Status: DC

## 2013-06-09 ENCOUNTER — Other Ambulatory Visit: Payer: Managed Care, Other (non HMO) | Admitting: Internal Medicine

## 2013-06-09 DIAGNOSIS — Z79899 Other long term (current) drug therapy: Secondary | ICD-10-CM

## 2013-06-09 DIAGNOSIS — E785 Hyperlipidemia, unspecified: Secondary | ICD-10-CM

## 2013-06-09 LAB — LIPID PANEL
CHOL/HDL RATIO: 2.3 ratio
Cholesterol: 144 mg/dL (ref 0–200)
HDL: 62 mg/dL (ref >39)
LDL CALC: 73 mg/dL (ref 0–99)
TRIGLYCERIDES: 47 mg/dL (ref <150)
VLDL: 9 mg/dL (ref 0–40)

## 2013-06-09 LAB — HEPATIC FUNCTION PANEL
ALBUMIN: 4.4 g/dL (ref 3.5–5.2)
ALK PHOS: 100 U/L (ref 39–117)
ALT: 18 U/L (ref 0–35)
AST: 18 U/L (ref 0–37)
Bilirubin, Direct: 0.1 mg/dL (ref 0.0–0.3)
Indirect Bilirubin: 0.3 mg/dL (ref 0.0–0.9)
TOTAL PROTEIN: 7.1 g/dL (ref 6.0–8.3)
Total Bilirubin: 0.4 mg/dL (ref 0.3–1.2)

## 2013-06-10 ENCOUNTER — Encounter: Payer: Self-pay | Admitting: Internal Medicine

## 2013-06-10 ENCOUNTER — Ambulatory Visit (INDEPENDENT_AMBULATORY_CARE_PROVIDER_SITE_OTHER): Payer: Managed Care, Other (non HMO) | Admitting: Internal Medicine

## 2013-06-10 VITALS — BP 140/84 | HR 68 | Temp 99.3°F | Wt 191.0 lb

## 2013-06-10 DIAGNOSIS — Z23 Encounter for immunization: Secondary | ICD-10-CM

## 2013-06-10 DIAGNOSIS — E785 Hyperlipidemia, unspecified: Secondary | ICD-10-CM

## 2013-06-10 DIAGNOSIS — I10 Essential (primary) hypertension: Secondary | ICD-10-CM

## 2013-06-10 DIAGNOSIS — E119 Type 2 diabetes mellitus without complications: Secondary | ICD-10-CM

## 2013-06-10 NOTE — Progress Notes (Signed)
   Subjective:    Patient ID: Victoria Hess, female    DOB: 19-Mar-1946, 68 y.o.   MRN: [id number]  HPI Three-month followup on hyperlipidemia, hypertension and diabetes mellitus.  Had diabetic eye exam April 2014. Lipid panel and liver functions done recently are now within normal limits.  Review of Systems     Objective:   Physical Exam Neck is supple without JVD thyromegaly or carotid bruits. Chest clear to auscultation. Cardiac exam regular rate and rhythm normal S1 and S2. Extremities without edema       Assessment & Plan:  Controlled type 2 diabetes mellitus  Hypertension-stable  Hyperlipidemia-market improvement  Plan: Return in 4-6 months for physical examination. Continue diet, exercise, and weight loss efforts

## 2013-06-11 MED ORDER — PNEUMOCOCCAL VAC POLYVALENT 25 MCG/0.5ML IJ INJ: 0.5000 mL | INJECTION | INTRAMUSCULAR | Status: DC

## 2013-07-11 NOTE — Patient Instructions (Signed)
Return in 4-6 months for physical examination. Continue diet exercise and weight loss efforts.

## 2013-07-28 ENCOUNTER — Other Ambulatory Visit: Payer: Self-pay

## 2013-07-28 MED ORDER — POTASSIUM CHLORIDE CRYS ER 20 MEQ PO TBCR: 20.0000 meq | EXTENDED_RELEASE_TABLET | Freq: Two times a day (BID) | ORAL | Status: DC

## 2013-07-30 ENCOUNTER — Encounter (INDEPENDENT_AMBULATORY_CARE_PROVIDER_SITE_OTHER): Payer: Self-pay | Admitting: General Surgery

## 2013-07-30 ENCOUNTER — Ambulatory Visit (INDEPENDENT_AMBULATORY_CARE_PROVIDER_SITE_OTHER): Payer: Managed Care, Other (non HMO) | Admitting: General Surgery

## 2013-07-30 VITALS — BP 142/80 | HR 72 | Temp 98.1°F | Resp 14 | Ht 64.0 in | Wt 194.2 lb

## 2013-07-30 DIAGNOSIS — C50919 Malignant neoplasm of unspecified site of unspecified female breast: Secondary | ICD-10-CM

## 2013-07-30 NOTE — Progress Notes (Signed)
Subjective:     Patient ID: Victoria Hess, female   DOB: 08-30-1945, 67 y.o.   MRN: [id number]  HPI This is a 68 year old female who in 2002 underwent a lumpectomy with radiotherapy for stage 0 breast cancer at [facility]. In October of 2011 she underwent a lumpectomy sentinel node for a T1CN1MIC left breast tumor. She underwent radiotherapy. This is hormone receptor positive. She is now on letrozole. Her BRCA status was negative. She returns today doing well without any complaints. She had one episode since last seen her of some left breast and hand redness and itching that resolved with some Benadryl. Otherwise she has no arm swelling. She has good motion of her arm. She has no nipple discharge and notes no masses on her own exam. She has a mammogram last summer that is negative and is due for one again this summer.  Review of Systems DIGITAL DIAGNOSTIC BILATERAL MAMMOGRAM WITH CAD  Comparison: 01/02/2011  Findings:  ACR Breast Density Category a: The breast tissue is almost  entirely fatty.  Bilateral lumpectomy changes are present. Radiation therapy  changes are still evident on the left. Left sentinel node  dissection changes are present. There is no suspicious dominant  mass, nonsurgical architectural distortion, or calcification to  suggest malignancy.  Mammographic images were processed with CAD.  IMPRESSION:  No mammographic evidence of malignancy.  RECOMMENDATION:  Yearly diagnostic mammography is suggested.  I have discussed the findings and recommendations with the patient.  Results were also provided in writing at the conclusion of the  visit. If applicable, a reminder letter will be sent to the  patient regarding her next appointment.  BI-RADS CATEGORY 2: Benign finding(s).     Objective:   Physical Exam  Constitutional: She appears well-developed and well-nourished.  Pulmonary/Chest: Right breast exhibits no inverted nipple, no mass, no nipple discharge, no skin change and  no tenderness. Left breast exhibits skin change (c/w radiation therapy). Left breast exhibits no inverted nipple, no mass, no nipple discharge and no tenderness.    Lymphadenopathy:    She has no cervical adenopathy.    She has no axillary adenopathy.       Right: No supraclavicular adenopathy present.       Left: No supraclavicular adenopathy present.       Assessment:     Bilateral breast cancer    Plan:     She has no clinical evidence of recurrence. She's going to continue her annual mammograms, monthly clinical exams I will see her back in one year. She will follow up with medical oncology in 6 months.

## 2013-08-14 ENCOUNTER — Other Ambulatory Visit: Payer: Self-pay

## 2013-08-14 MED ORDER — FUROSEMIDE 40 MG PO TABS: 40.0000 mg | ORAL_TABLET | Freq: Every day | ORAL | Status: DC

## 2013-08-21 ENCOUNTER — Other Ambulatory Visit: Payer: Commercial Indemnity | Admitting: Lab

## 2013-08-28 ENCOUNTER — Ambulatory Visit: Payer: Commercial Indemnity | Admitting: Physician Assistant

## 2013-10-16 ENCOUNTER — Other Ambulatory Visit: Payer: Self-pay

## 2013-10-16 MED ORDER — LEVOTHYROXINE SODIUM 50 MCG PO TABS: 50.0000 ug | ORAL_TABLET | Freq: Every day | ORAL | Status: DC

## 2013-11-02 ENCOUNTER — Telehealth: Payer: Self-pay | Admitting: Oncology

## 2013-11-02 NOTE — Telephone Encounter (Signed)
per GM to r/s-will ad pt of new time & date-will mail copy of sch

## 2013-11-03 ENCOUNTER — Telehealth: Payer: Self-pay | Admitting: Oncology

## 2013-11-03 NOTE — Telephone Encounter (Signed)
cld both #'s on acct-1st # no answer no voicemail-mobile-no voicemail set up-will mail pt copy of new sch time & date

## 2013-12-01 ENCOUNTER — Other Ambulatory Visit: Payer: Self-pay

## 2013-12-01 ENCOUNTER — Other Ambulatory Visit: Payer: Managed Care, Other (non HMO) | Admitting: Internal Medicine

## 2013-12-01 DIAGNOSIS — E039 Hypothyroidism, unspecified: Secondary | ICD-10-CM | POA: Diagnosis not present

## 2013-12-01 DIAGNOSIS — Z79899 Other long term (current) drug therapy: Secondary | ICD-10-CM | POA: Diagnosis not present

## 2013-12-01 DIAGNOSIS — R7301 Impaired fasting glucose: Secondary | ICD-10-CM

## 2013-12-01 DIAGNOSIS — E785 Hyperlipidemia, unspecified: Secondary | ICD-10-CM

## 2013-12-01 LAB — LIPID PANEL
CHOLESTEROL: 155 mg/dL (ref 0–200)
HDL: 78 mg/dL (ref >39)
LDL Cholesterol: 66 mg/dL (ref 0–99)
Total CHOL/HDL Ratio: 2 Ratio
Triglycerides: 57 mg/dL (ref <150)
VLDL: 11 mg/dL (ref 0–40)

## 2013-12-01 LAB — TSH: TSH: 2.251 u[IU]/mL (ref 0.350–4.500)

## 2013-12-01 LAB — HEPATIC FUNCTION PANEL
ALK PHOS: 90 U/L (ref 39–117)
ALT: 12 U/L (ref 0–35)
AST: 16 U/L (ref 0–37)
Albumin: 4.1 g/dL (ref 3.5–5.2)
BILIRUBIN DIRECT: 0.1 mg/dL (ref 0.0–0.3)
BILIRUBIN TOTAL: 0.4 mg/dL (ref 0.2–1.2)
Indirect Bilirubin: 0.3 mg/dL (ref 0.2–1.2)
Total Protein: 7.1 g/dL (ref 6.0–8.3)

## 2013-12-01 LAB — HEMOGLOBIN A1C
Hgb A1c MFr Bld: 6.3 % — ABNORMAL HIGH (ref <5.7)
Mean Plasma Glucose: 134 mg/dL — ABNORMAL HIGH (ref <117)

## 2013-12-01 MED ORDER — SIMVASTATIN 10 MG PO TABS: 10.0000 mg | ORAL_TABLET | Freq: Every day | ORAL | Status: DC

## 2013-12-02 ENCOUNTER — Ambulatory Visit (INDEPENDENT_AMBULATORY_CARE_PROVIDER_SITE_OTHER): Payer: Managed Care, Other (non HMO) | Admitting: Internal Medicine

## 2013-12-02 ENCOUNTER — Encounter: Payer: Self-pay | Admitting: Internal Medicine

## 2013-12-02 VITALS — BP 130/80 | HR 56 | Wt 194.0 lb

## 2013-12-02 DIAGNOSIS — E039 Hypothyroidism, unspecified: Secondary | ICD-10-CM | POA: Diagnosis not present

## 2013-12-02 DIAGNOSIS — E119 Type 2 diabetes mellitus without complications: Secondary | ICD-10-CM

## 2013-12-02 DIAGNOSIS — E785 Hyperlipidemia, unspecified: Secondary | ICD-10-CM | POA: Diagnosis not present

## 2013-12-02 DIAGNOSIS — I10 Essential (primary) hypertension: Secondary | ICD-10-CM

## 2013-12-02 MED ORDER — METFORMIN HCL 500 MG PO TABS: 500.0000 mg | ORAL_TABLET | Freq: Two times a day (BID) | ORAL | Status: DC

## 2013-12-03 LAB — MICROALBUMIN, URINE: MICROALB UR: 0.74 mg/dL (ref 0.00–1.89)

## 2014-01-01 ENCOUNTER — Other Ambulatory Visit: Payer: Self-pay | Admitting: *Deleted

## 2014-01-01 MED ORDER — GLUCOSE BLOOD VI STRP: ORAL_STRIP | Status: DC

## 2014-01-01 NOTE — Telephone Encounter (Signed)
rx sent to pharmacy by e-script  

## 2014-01-28 ENCOUNTER — Other Ambulatory Visit: Payer: Self-pay

## 2014-01-28 MED ORDER — GLUCOSE BLOOD VI STRP: ORAL_STRIP | Status: DC

## 2014-01-30 ENCOUNTER — Other Ambulatory Visit: Payer: Managed Care, Other (non HMO) | Admitting: Internal Medicine

## 2014-01-30 ENCOUNTER — Ambulatory Visit
Admission: RE | Admit: 2014-01-30 | Discharge: 2014-01-30 | Disposition: A | Discharge status: Home / Self Care | Payer: Managed Care, Other (non HMO) | Source: Ambulatory Visit | Attending: Physician Assistant | Admitting: Physician Assistant

## 2014-01-30 ENCOUNTER — Ambulatory Visit
Admission: RE | Admit: 2014-01-30 | Discharge: 2014-01-30 | Disposition: A | Discharge status: Home / Self Care | Payer: Medicare Other | Source: Ambulatory Visit | Attending: Physician Assistant | Admitting: Physician Assistant

## 2014-01-30 ENCOUNTER — Inpatient Hospital Stay: Admission: RE | Admit: 2014-01-30 | Payer: Commercial Indemnity | Source: Ambulatory Visit

## 2014-01-30 DIAGNOSIS — Z853 Personal history of malignant neoplasm of breast: Secondary | ICD-10-CM

## 2014-01-30 DIAGNOSIS — E119 Type 2 diabetes mellitus without complications: Secondary | ICD-10-CM | POA: Diagnosis not present

## 2014-01-30 DIAGNOSIS — E663 Overweight: Secondary | ICD-10-CM | POA: Diagnosis not present

## 2014-01-30 LAB — HEMOGLOBIN A1C
HEMOGLOBIN A1C: 6.3 % — AB (ref <5.7)
Mean Plasma Glucose: 134 mg/dL — ABNORMAL HIGH (ref <117)

## 2014-02-01 ENCOUNTER — Encounter: Payer: Self-pay | Admitting: Internal Medicine

## 2014-02-01 NOTE — Progress Notes (Signed)
   Subjective:    Patient ID: Victoria Hess, female    DOB: 22-Feb-1946, 68 y.o.   MRN: [id number]  HPI In today for six-month recheck on hypertension, hyperlipidemia and diabetes mellitus. Feels well with no complaints. Is not on any medication for diabetes. Tries to watch diet. Difficulty losing weight. Fasting lipid panel liver functions are within normal limits. Hemoglobin A1c is 6.3%. Urine negative for significant microalbumin. History of hypothyroidism. TSH is within normal limits.    Review of Systems     Objective:   Physical Exam  Skin warm and dry. Nodes none. Chest clear to auscultation. Cardiac exam regular rate and rhythm normal S1 and S2. Extremities without significant edema      Assessment & Plan:  Hypertension-stable  Hyperlipidemia-normal on lipid-lowering medication  Hypothyroidism-TSH is stable  Type 2 diabetes mellitus-begin metformin and return for in 6-8 weeks for followup  25 minutes spent with patient

## 2014-02-01 NOTE — Patient Instructions (Addendum)
Begin metformin and return in 6-8 weeks for followup.

## 2014-02-02 ENCOUNTER — Other Ambulatory Visit: Payer: Managed Care, Other (non HMO) | Admitting: Internal Medicine

## 2014-02-02 ENCOUNTER — Telehealth: Payer: Self-pay

## 2014-02-02 NOTE — Telephone Encounter (Signed)
Tried to contact patient regarding her A1C results.  Per Dr Renold Genta, she would like to know if patient is still taking her metformin?  Does it cause diarrhea?  Can she increase metformin to 1000mg  bid?  What are her accuchecks running? Will try to contact patient again regarding this.

## 2014-02-04 NOTE — Telephone Encounter (Signed)
Left message for patient to call office regarding her lab results and a few questions Dr Renold Genta had.

## 2014-02-05 ENCOUNTER — Telehealth: Payer: Self-pay

## 2014-02-05 NOTE — Telephone Encounter (Signed)
Try checking glucose before lunch sometimes. These readings are very good if accurate. May continue Metformin once a day and return in November. I do not see a decrease in Hemoglobin AIC since we started metformin. Continue to watch diet and lose weight.

## 2014-02-05 NOTE — Telephone Encounter (Signed)
Patient says she is only taking 1 metformin, when she took 2 it caused diarrhea.  Her glucose has been running 71-109.  She checks her glucose fasting before breakfast and dinner.

## 2014-02-06 NOTE — Telephone Encounter (Signed)
Left message advising patient to Try checking glucose before lunch sometimes. These readings are very good if accurate. May continue Metformin once a day and return in November. I do not see a decrease in Hemoglobin AIC since we started metformin. Continue to watch diet and lose weight Advised patient to call and make appointment.

## 2014-03-09 ENCOUNTER — Other Ambulatory Visit: Payer: Commercial Indemnity

## 2014-03-16 ENCOUNTER — Other Ambulatory Visit: Payer: Self-pay

## 2014-03-16 ENCOUNTER — Ambulatory Visit: Payer: Commercial Indemnity | Admitting: Oncology

## 2014-03-16 MED ORDER — FUROSEMIDE 40 MG PO TABS
40.0000 mg | ORAL_TABLET | Freq: Every day | ORAL | Status: DC
Start: 2014-03-16 — End: 2014-12-09

## 2014-03-17 ENCOUNTER — Other Ambulatory Visit: Payer: Self-pay | Admitting: *Deleted

## 2014-03-17 DIAGNOSIS — C50911 Malignant neoplasm of unspecified site of right female breast: Secondary | ICD-10-CM

## 2014-03-17 MED ORDER — LETROZOLE 2.5 MG PO TABS: 2.5000 mg | ORAL_TABLET | Freq: Every day | ORAL | Status: DC

## 2014-03-27 ENCOUNTER — Other Ambulatory Visit: Payer: Self-pay | Admitting: Internal Medicine

## 2014-03-27 ENCOUNTER — Other Ambulatory Visit: Payer: Self-pay | Admitting: *Deleted

## 2014-03-27 DIAGNOSIS — E119 Type 2 diabetes mellitus without complications: Secondary | ICD-10-CM

## 2014-03-27 DIAGNOSIS — C50919 Malignant neoplasm of unspecified site of unspecified female breast: Secondary | ICD-10-CM

## 2014-03-30 ENCOUNTER — Other Ambulatory Visit: Payer: Self-pay | Admitting: Internal Medicine

## 2014-03-30 ENCOUNTER — Other Ambulatory Visit (HOSPITAL_BASED_OUTPATIENT_CLINIC_OR_DEPARTMENT_OTHER): Payer: Medicare Other

## 2014-03-30 DIAGNOSIS — Z853 Personal history of malignant neoplasm of breast: Secondary | ICD-10-CM

## 2014-03-30 DIAGNOSIS — C50919 Malignant neoplasm of unspecified site of unspecified female breast: Secondary | ICD-10-CM

## 2014-03-30 LAB — HEMOGLOBIN A1C
HEMOGLOBIN A1C: 6.1 % — AB (ref <5.7)
Mean Plasma Glucose: 128 mg/dL — ABNORMAL HIGH (ref <117)

## 2014-03-30 LAB — COMPREHENSIVE METABOLIC PANEL (CC13)
ALBUMIN: 4.2 g/dL (ref 3.5–5.0)
ALK PHOS: 88 U/L (ref 40–150)
ALT: 13 U/L (ref 0–55)
ANION GAP: 11 meq/L (ref 3–11)
AST: 16 U/L (ref 5–34)
BUN: 22.7 mg/dL (ref 7.0–26.0)
CHLORIDE: 103 meq/L (ref 98–109)
CO2: 26 meq/L (ref 22–29)
Calcium: 10 mg/dL (ref 8.4–10.4)
Creatinine: 0.9 mg/dL (ref 0.6–1.1)
GLUCOSE: 119 mg/dL (ref 70–140)
POTASSIUM: 3.6 meq/L (ref 3.5–5.1)
SODIUM: 140 meq/L (ref 136–145)
TOTAL PROTEIN: 7.7 g/dL (ref 6.4–8.3)
Total Bilirubin: 0.29 mg/dL (ref 0.20–1.20)

## 2014-03-30 LAB — CBC WITH DIFFERENTIAL/PLATELET
BASO%: 0.2 % (ref 0.0–2.0)
Basophils Absolute: 0 10*3/uL (ref 0.0–0.1)
EOS ABS: 0.1 10*3/uL (ref 0.0–0.5)
EOS%: 1.3 % (ref 0.0–7.0)
HCT: 35.1 % (ref 34.8–46.6)
HGB: 11.4 g/dL — ABNORMAL LOW (ref 11.6–15.9)
LYMPH#: 2.1 10*3/uL (ref 0.9–3.3)
LYMPH%: 25.6 % (ref 14.0–49.7)
MCH: 28.1 pg (ref 25.1–34.0)
MCHC: 32.5 g/dL (ref 31.5–36.0)
MCV: 86.7 fL (ref 79.5–101.0)
MONO#: 0.6 10*3/uL (ref 0.1–0.9)
MONO%: 7 % (ref 0.0–14.0)
NEUT%: 65.9 % (ref 38.4–76.8)
NEUTROS ABS: 5.5 10*3/uL (ref 1.5–6.5)
Platelets: 284 10*3/uL (ref 145–400)
RBC: 4.05 10*6/uL (ref 3.70–5.45)
RDW: 14.3 % (ref 11.2–14.5)
WBC: 8.4 10*3/uL (ref 3.9–10.3)

## 2014-04-03 ENCOUNTER — Ambulatory Visit (INDEPENDENT_AMBULATORY_CARE_PROVIDER_SITE_OTHER): Payer: Managed Care, Other (non HMO) | Admitting: Internal Medicine

## 2014-04-03 ENCOUNTER — Encounter: Payer: Self-pay | Admitting: Internal Medicine

## 2014-04-03 VITALS — BP 148/82 | HR 69 | Temp 98.6°F | Ht 63.5 in | Wt 178.0 lb

## 2014-04-03 DIAGNOSIS — E8881 Metabolic syndrome: Secondary | ICD-10-CM

## 2014-04-03 DIAGNOSIS — E039 Hypothyroidism, unspecified: Secondary | ICD-10-CM | POA: Diagnosis not present

## 2014-04-03 DIAGNOSIS — E669 Obesity, unspecified: Secondary | ICD-10-CM | POA: Diagnosis not present

## 2014-04-03 DIAGNOSIS — E785 Hyperlipidemia, unspecified: Secondary | ICD-10-CM

## 2014-04-03 DIAGNOSIS — E119 Type 2 diabetes mellitus without complications: Secondary | ICD-10-CM | POA: Diagnosis not present

## 2014-04-03 DIAGNOSIS — I10 Essential (primary) hypertension: Secondary | ICD-10-CM | POA: Diagnosis not present

## 2014-04-03 MED ORDER — GLUCOSE BLOOD VI STRP: ORAL_STRIP | Status: DC

## 2014-04-03 MED ORDER — ONETOUCH ULTRA SYSTEM W/DEVICE KIT: 1.0000 | PACK | Freq: Once | Status: AC

## 2014-04-03 MED ORDER — ONETOUCH ULTRASOFT LANCETS MISC: Status: AC

## 2014-04-06 ENCOUNTER — Telehealth: Payer: Self-pay

## 2014-04-06 ENCOUNTER — Telehealth: Payer: Self-pay | Admitting: Oncology

## 2014-04-06 ENCOUNTER — Ambulatory Visit (HOSPITAL_BASED_OUTPATIENT_CLINIC_OR_DEPARTMENT_OTHER): Payer: Managed Care, Other (non HMO) | Admitting: Oncology

## 2014-04-06 VITALS — BP 148/62 | HR 59 | Temp 98.6°F | Resp 18 | Ht 63.5 in | Wt 176.9 lb

## 2014-04-06 DIAGNOSIS — C50912 Malignant neoplasm of unspecified site of left female breast: Secondary | ICD-10-CM

## 2014-04-06 DIAGNOSIS — Z170 Estrogen receptor positive status [ER+]: Secondary | ICD-10-CM

## 2014-04-06 DIAGNOSIS — Z853 Personal history of malignant neoplasm of breast: Secondary | ICD-10-CM

## 2014-04-06 LAB — MICROALBUMIN, URINE

## 2014-04-06 NOTE — Progress Notes (Signed)
ID: Victoria Hess   DOB: [birth year]  MR#: [id number]  ERD#:[id number]   PCP:  Elby Showers, MD GYN: SURRolm Bookbinder, MD OTHER:   CHIEF COMPLAINT:  Bilateral Breast Cancer  CURRENT TREATMENT: Letrozole   HISTORY OF PRESENT ILLNESS: From the original intake note: The patient has a history of prior right lumpectomy and radiation therapy (2002) for what by her account was a Stage 0 ductal carcinoma in situ.  More recently, she had diagnostic mammography at [facility] on December 31, 2009 and this showed group calcifications in the left breast felt to be moderately suspicious for malignancy.  The patient was set up for stereotactic biopsy 01/04/2010 and the pathology report [id number] at the [facility]) showed a small focus of invasive adenocarcinoma, grade 1, with some evidence of in situ carcinoma.  The tumor was estrogen receptor positive at 100%, progesterone receptor positive at 100% and there was no Her-2 amplification by the Hercept test with a score of 1+.    Bilateral breast MRIs were performed February 04, 2010.  There was some postoperative change in the upper outer quadrant of the right breast with no suspicious findings.  In the left breast, lower outer quadrant, there was an irregular spiculated enhancing mass measuring 2.3 cm maximally.  There were no other masses associated with this and there were no suspicious internal mammary or axillary lymph nodes noted.    With this information, the patient was referred to Dr. Donne Hazel and he set her up for genetic testing which came back favorable (no BRCA-1 or BRCA-2 mutations detected).  He then set her up for lumpectomy and sentinel lymph node dissection which was performed March 07, 2010.  The final pathology from that procedure ([id number]) showed a Grade 1 invasive ductal carcinoma measuring 1.5 cm with margins negative and ample.  There was no lymphovascular invasion.  One of four sentinel  lymph nodes sampled had a micrometastatic deposit.  Repeat prognostic panel showed the tumor to be ER positive at 100%, PR positive at 66% and showed no Her-2 amplification by CISH with a ratio of 1.24.   Her subsequent history is as detailed below  INTERVAL HISTORY: Victoria Hess returns today for follow-up of her bilateral breast cancers. She continues on letrozole. She denies significant problems with hot flashes, vaginal dryness, arthralgias, or myalgias. She is obtaining the drug at a good price.  REVIEW OF SYSTEMS: A detailed review of systems today was negative except for a mild dry cough. She tells me her diabetes is under good control. A detailed review of systems today was otherwise noncontributory.   PAST MEDICAL HISTORY: Past Medical History  Diagnosis Date  . Hypertension   . Wears glasses   . Wears dentures     partial  . Cancer     Stage 2 breast cancer  . Thyroid disease     hypothyroidism  . Hyperlipidemia     PAST SURGICAL HISTORY: Past Surgical History  Procedure Laterality Date  . Tubal ligation    . Appendectomy    . Breast surgery      left lumpectomy sentinel  node biopsy  . Breast surgery      right lumpectomy    FAMILY HISTORY Family History  Problem Relation Age of Onset  . Diabetes Mother   . Heart disease Mother   . Diabetes Father   . Cancer Father   . Diabetes Sister   . Hyperlipidemia Sister   . Cancer Sister  breast     GYNECOLOGIC HISTORY: The patient is GX P1, first pregnancy to term at age 5. She went through the change of life around the year 2000.  She did not receive hormone replacement.  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  Her husband of 61+ years, Wilber Oliphant, works in Production assistant, radio.  Daughter Angela Nevin works as a temp.  The patient has two granddaughters, aged 64 and 15.  She is a Psychologist, forensic.    ADVANCED DIRECTIVES: not in place  HEALTH MAINTENANCE: (Updated  03/10/2013) History  Substance Use Topics  . Smoking status: Never Smoker   . Smokeless tobacco: Never Used  . Alcohol Use: No     Colonoscopy: UTD (Not on file)  PAP: Not on file  Bone density: 2013/ normal  Lipid panel: April 2014, Dr. Renold Genta  No Known Allergies  Current Outpatient Prescriptions  Medication Sig Dispense Refill  . amLODipine (NORVASC) 10 MG tablet Take 1 tablet (10 mg total) by mouth daily. 30 tablet 11  . aspirin 81 MG tablet Take 81 mg by mouth daily.      . Blood Glucose Monitoring Suppl (ONE TOUCH ULTRA SYSTEM KIT) W/DEVICE KIT 1 kit by Does not apply route once. Test bid.  DX E11.9 1 each 0  . BYSTOLIC 10 MG tablet     . Calcium Carbonate (CALCIUM 600 PO) Take by mouth daily.      . Cholecalciferol (VITAMIN D PO) Take 2,000 mg by mouth daily.      . furosemide (LASIX) 40 MG tablet Take 1 tablet (40 mg total) by mouth daily. 90 tablet 3  . glucose blood test strip Test BID.  DX E11.9 100 each 12  . Lancets (ONETOUCH ULTRASOFT) lancets Test BID.  DX E11.9 100 each 12  . letrozole (FEMARA) 2.5 MG tablet Take 1 tablet (2.5 mg total) by mouth daily. 90 tablet 0  . levothyroxine (SYNTHROID, LEVOTHROID) 50 MCG tablet Take 1 tablet (50 mcg total) by mouth daily. 30 tablet 5  . metFORMIN (GLUCOPHAGE) 500 MG tablet Take 1 tablet (500 mg total) by mouth 2 (two) times daily with a meal. 60 tablet 3  . Multiple Vitamin (MULTIVITAMIN PO) Take by mouth daily.      . Nebivolol HCl (BYSTOLIC) 20 MG TABS Take 1 tablet (20 mg total) by mouth daily. 60 tablet 5  . potassium chloride SA (K-DUR,KLOR-CON) 20 MEQ tablet Take 1 tablet (20 mEq total) by mouth 2 (two) times daily. 60 tablet 11  . simvastatin (ZOCOR) 10 MG tablet Take 1 tablet (10 mg total) by mouth at bedtime. 90 tablet 1   Current Facility-Administered Medications  Medication Dose Route Frequency Provider Last Rate Last Dose  . pneumococcal 23 valent vaccine (PNU-IMMUNE) injection 0.5 mL  0.5 mL Intramuscular  Tomorrow-1000 Elby Showers, MD        OBJECTIVE: Middle-aged [place] American woman who appears stated age 68 Vitals:   04/06/14 1552  BP: 148/62  Pulse: 59  Temp: 98.6 F (37 C)  Resp: 18     Body mass index is 30.84 kg/(m^2).    ECOG FS: 0 Filed Weights   04/06/14 1552  Weight: 176 lb 14.4 oz (80.241 kg)   Sclerae unicteric, pupils equal and reactive Oropharynx clear and moist No cervical or supraclavicular adenopathy Lungs no rales or rhonchi Heart regular rate and rhythm Abd soft, nontender, positive bowel sounds MSK no focal spinal tenderness, no upper  extremity lymphedema Neuro: nonfocal, well oriented, appropriate affect Breasts: The right breast is status post lumpectomy and radiation. There is no evidence of local recurrence. The right axilla is benign. The left breast is also status post lumpectomy and radiation. It is slightly larger and coarser than the right breast. There is no evidence of local recurrence. The left axilla is benign.    LAB RESULTS: Lab Results  Component Value Date   WBC 8.4 03/30/2014   NEUTROABS 5.5 03/30/2014   HGB 11.4* 03/30/2014   HCT 35.1 03/30/2014   MCV 86.7 03/30/2014   PLT 284 03/30/2014      Chemistry      Component Value Date/Time   NA 140 03/30/2014 1520   NA 139 03/04/2013 0949   K 3.6 03/30/2014 1520   K 4.3 03/04/2013 0949   CL 99 03/04/2013 0949   CL 102 02/29/2012 1517   CO2 26 03/30/2014 1520   CO2 31 03/04/2013 0949   BUN 22.7 03/30/2014 1520   BUN 14 03/04/2013 0949   CREATININE 0.9 03/30/2014 1520   CREATININE 0.90 03/04/2013 0949   CREATININE 0.86 08/16/2011 1358      Component Value Date/Time   CALCIUM 10.0 03/30/2014 1520   CALCIUM 10.2 03/04/2013 0949   CALCIUM 10.2 03/04/2013 0949   ALKPHOS 88 03/30/2014 1520   ALKPHOS 90 12/01/2013 0942   AST 16 03/30/2014 1520   AST 16 12/01/2013 0942   ALT 13 03/30/2014 1520   ALT 12 12/01/2013 0942   BILITOT 0.29 03/30/2014 1520   BILITOT 0.4  12/01/2013 0942       Lab Results  Component Value Date   LABCA2 34 02/29/2012      STUDIES: CLINICAL DATA: 68 year old female for annual bilateral mammograms. History of left breast cancer and lumpectomy in 2011. History of right breast cancer and lumpectomy in 2002.  EXAM: DIGITAL DIAGNOSTIC BILATERAL MAMMOGRAM WITH 3D TOMOSYNTHESIS AND CAD  COMPARISON: 01/29/2013 and prior mammograms dating back to 03/07/2010  ACR Breast Density Category b: There are scattered areas of fibroglandular density.  FINDINGS: A magnification view of the lumpectomy site and routine 2D and 3D images of both breasts performed.  Scarring within the posterior upper left breast and upper-outer right breast are again identified.  No suspicious mass, nonsurgical distortion or worrisome calcifications are identified.  Post treatment skin thickening on the left is again noted.  Mammographic images were processed with CAD.  IMPRESSION: No evidence of breast malignancy.  Bilateral breast scarring.  RECOMMENDATION: Bilateral diagnostic mammograms in 1 year.  I have discussed the findings and recommendations with the patient. Results were also provided in writing at the conclusion of the visit. If applicable, a reminder letter will be sent to the patient regarding the next appointment.  BI-RADS CATEGORY 2: Benign.   Electronically Signed  By: Hassan Rowan M.D.  On: 01/30/2014 09:35  ASSESSMENT:  68 year old [place] woman: 1. Status post right lumpectomy 2002 for what likely was a ductal carcinoma in situ, treated with radiation under [facility]. 2. Status post left lumpectomy October 2011 for a T1c N1 (mic) stage IB invasive ductal carcinoma, grade 1, strongly estrogen and progesterone receptor positive, HER2/neu negative.  Margins were ample. 3.  radiation therapy completed in January 2012 4. started letrozole January of 2012, the goal being to continue for total  of 5 years (until January 2017) 5. [facility].   PLAN: Earnesteen is now a little over 4 years out from  her right lumpectomy with no evidence of disease recurrence. She is of course 13 years out from her noninvasive breast cancer. She is tolerating letrozole well, and the plan is to continue letrozole for a total of 5 years.  Today I gave her information on the [employer] program point to think she would enjoy. I also gave her information on our "intimacy and pelvic health" program. I think that would be helpful for her as well.  Otherwise she will return to see me in one year. She will likely ""graduate at that time." She has a good understanding of the overall plan. She agrees with it. She will call with any problems that may develop before her next visit here.   Chauncey Cruel, MD      04/06/2014

## 2014-04-06 NOTE — Addendum Note (Signed)
Addended by: Laureen Abrahams on: 04/06/2014 06:43 PM   Modules accepted: Medications

## 2014-04-06 NOTE — Telephone Encounter (Signed)
per pof to sch pt appt-gave pt copy of sch °

## 2014-04-06 NOTE — Telephone Encounter (Signed)
[facility] called to let us know the microalbumin test was unable to be performed due to inaccurate vial.

## 2014-04-14 ENCOUNTER — Other Ambulatory Visit: Payer: Self-pay

## 2014-04-14 MED ORDER — LEVOTHYROXINE SODIUM 50 MCG PO TABS: 50.0000 ug | ORAL_TABLET | Freq: Every day | ORAL | Status: DC

## 2014-04-27 ENCOUNTER — Other Ambulatory Visit: Payer: Self-pay

## 2014-04-27 MED ORDER — AMLODIPINE BESYLATE 10 MG PO TABS: 10.0000 mg | ORAL_TABLET | Freq: Every day | ORAL | Status: DC

## 2014-05-11 ENCOUNTER — Other Ambulatory Visit: Payer: Self-pay | Admitting: *Deleted

## 2014-05-11 NOTE — Telephone Encounter (Signed)
Patient is taking Bystolic 10mg  tabs 2 tabs daily according to her pharmacy. Approved pt for refill plus 11 more as per Dr Renold Genta

## 2014-05-18 ENCOUNTER — Other Ambulatory Visit: Payer: Self-pay | Admitting: *Deleted

## 2014-05-18 DIAGNOSIS — C50911 Malignant neoplasm of unspecified site of right female breast: Secondary | ICD-10-CM

## 2014-05-18 MED ORDER — LETROZOLE 2.5 MG PO TABS: 2.5000 mg | ORAL_TABLET | Freq: Every day | ORAL | Status: DC

## 2014-05-22 ENCOUNTER — Encounter: Payer: Self-pay | Admitting: Internal Medicine

## 2014-05-22 ENCOUNTER — Ambulatory Visit (INDEPENDENT_AMBULATORY_CARE_PROVIDER_SITE_OTHER): Payer: Managed Care, Other (non HMO) | Admitting: Internal Medicine

## 2014-05-22 VITALS — BP 142/82 | HR 68 | Temp 99.1°F | Wt 178.0 lb

## 2014-05-22 DIAGNOSIS — H109 Unspecified conjunctivitis: Secondary | ICD-10-CM | POA: Diagnosis not present

## 2014-05-22 DIAGNOSIS — J069 Acute upper respiratory infection, unspecified: Secondary | ICD-10-CM | POA: Diagnosis not present

## 2014-05-22 MED ORDER — AZITHROMYCIN 250 MG PO TABS: ORAL_TABLET | ORAL | Status: DC

## 2014-05-22 MED ORDER — OFLOXACIN 0.3 % OP SOLN: OPHTHALMIC | Status: DC

## 2014-05-22 MED ORDER — BENZONATATE 100 MG PO CAPS: 200.0000 mg | ORAL_CAPSULE | Freq: Three times a day (TID) | ORAL | Status: DC

## 2014-05-22 NOTE — Patient Instructions (Signed)
Take Zithromax Z-Pak as directed. Take Tessalon Perles as needed for cough. Ocuflox eyedrops 2 drops in each eye 4 times a day for 5 days.

## 2014-05-22 NOTE — Progress Notes (Signed)
   Subjective:    Patient ID: Victoria Hess, female    DOB: 14-Dec-1945, 69 y.o.   MRN: [id number]  HPI Patient walked in today complaining of a four-day history of respiratory infection. Has cough. No fever or shaking chills. Has developed irritation in left eye. She wears glasses. Husband is diagnosed with bronchitis. She went shopping and someone behind her cough that her. Hairdresser was sick with similar illness. She does work in [employer] system.    Review of Systems     Objective:   Physical Exam Pharynx is clear. TMs are full bilaterally but not red. Neck is supple. Chest clear to auscultation without rales or wheezing. Skin is warm and dry. Both eyes have inflamed conjunctivae       Assessment & Plan:  Bilateral conjunctivitis  Acute URI  Plan: Zithromax Z-Pak take 2 tablets day one followed by 1 tablet days 2 through 5 with 1 refill. If not better in one week have prescription refilled. Tessalon Perles 200 mg 3 times daily as needed for cough. Ocuflox ophthalmic solution 2 drops in each eye 4 times a day for 5 days.

## 2014-06-10 ENCOUNTER — Other Ambulatory Visit: Payer: Self-pay | Admitting: *Deleted

## 2014-06-10 MED ORDER — METFORMIN HCL 500 MG PO TABS: 500.0000 mg | ORAL_TABLET | Freq: Two times a day (BID) | ORAL | Status: DC

## 2014-06-10 NOTE — Telephone Encounter (Signed)
Refills on Metformin sent to pharmacy

## 2014-06-15 ENCOUNTER — Other Ambulatory Visit: Payer: Self-pay | Admitting: *Deleted

## 2014-06-15 MED ORDER — LEVOTHYROXINE SODIUM 50 MCG PO TABS: 50.0000 ug | ORAL_TABLET | Freq: Every day | ORAL | Status: DC

## 2014-06-15 NOTE — Telephone Encounter (Signed)
Refill sent for Levothyroxine to patient pharmacy

## 2014-06-26 ENCOUNTER — Other Ambulatory Visit: Payer: Self-pay | Admitting: *Deleted

## 2014-06-26 MED ORDER — POTASSIUM CHLORIDE CRYS ER 20 MEQ PO TBCR: 20.0000 meq | EXTENDED_RELEASE_TABLET | Freq: Two times a day (BID) | ORAL | Status: DC

## 2014-06-26 NOTE — Telephone Encounter (Signed)
Refill on potassium sent to patient pharmacy

## 2014-07-05 ENCOUNTER — Encounter: Payer: Self-pay | Admitting: Internal Medicine

## 2014-07-05 DIAGNOSIS — E8881 Metabolic syndrome: Secondary | ICD-10-CM | POA: Insufficient documentation

## 2014-07-05 NOTE — Patient Instructions (Addendum)
Monitor blood pressure at home and call if persistently elevated. Otherwise return in April for physical examination. Continue metformin. Diet exercise and weight loss recommended.

## 2014-07-05 NOTE — Progress Notes (Signed)
   Subjective:    Patient ID: Victoria Hess, female    DOB: April 23, 1946, 69 y.o.   MRN: [id number]  HPI In today for three-month recheck. Blood pressure is elevated today. History of hypertension, hyperlipidemia, hypothyroidism. type 2 diabetes mellitus, obesity, metabolic syndrome, history of breast cancer. In July was started on metformin for diabetes mellitus. Is here today for follow-up. However recent hemoglobin A1c still shows 6.3%    Review of Systems     Objective:   Physical Exam  Skin is warm and dry. Neck supple without JVD thyromegaly or carotid bruits. Chest clear. Cardiac exam regular rate and rhythm. Extremities without edema.      Assessment & Plan:  Controlled type 2 diabetes. Hemoglobin A1c is not changed with the addition of metformin 500 mg twice daily. Continue same regimen and return April 2016.  Obesity  Metabolic syndrome  Hypertension-monitor blood pressure at home and if persistently elevated contact me  Hyperlipidemia-stable on statin medication  Plan: Continue same medications. Encouraged diet exercise and weight loss. Return in April for physical examination.

## 2014-08-13 ENCOUNTER — Other Ambulatory Visit: Payer: Managed Care, Other (non HMO) | Admitting: Internal Medicine

## 2014-08-13 DIAGNOSIS — Z0000 Encounter for general adult medical examination without abnormal findings: Secondary | ICD-10-CM

## 2014-08-13 DIAGNOSIS — Z13220 Encounter for screening for lipoid disorders: Secondary | ICD-10-CM

## 2014-08-13 DIAGNOSIS — Z130 Encounter for screening for diseases of the blood and blood-forming organs and certain disorders involving the immune mechanism: Secondary | ICD-10-CM

## 2014-08-13 DIAGNOSIS — Z1321 Encounter for screening for nutritional disorder: Secondary | ICD-10-CM

## 2014-08-13 DIAGNOSIS — Z1329 Encounter for screening for other suspected endocrine disorder: Secondary | ICD-10-CM

## 2014-08-13 DIAGNOSIS — E119 Type 2 diabetes mellitus without complications: Secondary | ICD-10-CM

## 2014-08-13 LAB — LIPID PANEL
CHOL/HDL RATIO: 2 ratio
CHOLESTEROL: 186 mg/dL (ref 0–200)
HDL: 93 mg/dL (ref >=46)
LDL Cholesterol: 83 mg/dL (ref 0–99)
Triglycerides: 52 mg/dL (ref <150)
VLDL: 10 mg/dL (ref 0–40)

## 2014-08-13 LAB — CBC WITH DIFFERENTIAL/PLATELET
BASOS ABS: 0.1 10*3/uL (ref 0.0–0.1)
Basophils Relative: 1 % (ref 0–1)
EOS PCT: 2 % (ref 0–5)
Eosinophils Absolute: 0.1 10*3/uL (ref 0.0–0.7)
HCT: 35.8 % — ABNORMAL LOW (ref 36.0–46.0)
Hemoglobin: 11.6 g/dL — ABNORMAL LOW (ref 12.0–15.0)
Lymphocytes Relative: 26 % (ref 12–46)
Lymphs Abs: 1.7 10*3/uL (ref 0.7–4.0)
MCH: 27.5 pg (ref 26.0–34.0)
MCHC: 32.4 g/dL (ref 30.0–36.0)
MCV: 84.8 fL (ref 78.0–100.0)
MPV: 10.9 fL (ref 8.6–12.4)
Monocytes Absolute: 0.5 10*3/uL (ref 0.1–1.0)
Monocytes Relative: 8 % (ref 3–12)
NEUTROS PCT: 63 % (ref 43–77)
Neutro Abs: 4.2 10*3/uL (ref 1.7–7.7)
Platelets: 254 10*3/uL (ref 150–400)
RBC: 4.22 MIL/uL (ref 3.87–5.11)
RDW: 15.1 % (ref 11.5–15.5)
WBC: 6.7 10*3/uL (ref 4.0–10.5)

## 2014-08-13 LAB — COMPLETE METABOLIC PANEL WITH GFR
ALBUMIN: 4.4 g/dL (ref 3.5–5.2)
ALT: 11 U/L (ref 0–35)
AST: 18 U/L (ref 0–37)
Alkaline Phosphatase: 74 U/L (ref 39–117)
BUN: 20 mg/dL (ref 6–23)
CO2: 29 meq/L (ref 19–32)
Calcium: 10.2 mg/dL (ref 8.4–10.5)
Chloride: 103 mEq/L (ref 96–112)
Creat: 0.72 mg/dL (ref 0.50–1.10)
GFR, EST NON AFRICAN AMERICAN: 86 mL/min
GLUCOSE: 89 mg/dL (ref 70–99)
POTASSIUM: 5.5 meq/L — AB (ref 3.5–5.3)
Sodium: 139 mEq/L (ref 135–145)
TOTAL PROTEIN: 6.9 g/dL (ref 6.0–8.3)
Total Bilirubin: 0.4 mg/dL (ref 0.2–1.2)

## 2014-08-13 LAB — HEMOGLOBIN A1C
HEMOGLOBIN A1C: 6.2 % — AB (ref <5.7)
Mean Plasma Glucose: 131 mg/dL — ABNORMAL HIGH (ref <117)

## 2014-08-13 LAB — TSH: TSH: 1.504 u[IU]/mL (ref 0.350–4.500)

## 2014-08-14 ENCOUNTER — Ambulatory Visit (INDEPENDENT_AMBULATORY_CARE_PROVIDER_SITE_OTHER): Payer: Managed Care, Other (non HMO) | Admitting: Internal Medicine

## 2014-08-14 ENCOUNTER — Encounter: Payer: Self-pay | Admitting: Internal Medicine

## 2014-08-14 ENCOUNTER — Other Ambulatory Visit (HOSPITAL_COMMUNITY)
Admission: RE | Admit: 2014-08-14 | Discharge: 2014-08-14 | Disposition: A | Discharge status: Home / Self Care | Payer: Managed Care, Other (non HMO) | Source: Ambulatory Visit | Attending: Internal Medicine | Admitting: Internal Medicine

## 2014-08-14 VITALS — BP 138/82 | HR 72 | Temp 97.8°F | Ht 63.5 in | Wt 171.0 lb

## 2014-08-14 DIAGNOSIS — I10 Essential (primary) hypertension: Secondary | ICD-10-CM | POA: Diagnosis not present

## 2014-08-14 DIAGNOSIS — E8881 Metabolic syndrome: Secondary | ICD-10-CM | POA: Diagnosis not present

## 2014-08-14 DIAGNOSIS — Z124 Encounter for screening for malignant neoplasm of cervix: Secondary | ICD-10-CM | POA: Diagnosis not present

## 2014-08-14 DIAGNOSIS — C50919 Malignant neoplasm of unspecified site of unspecified female breast: Secondary | ICD-10-CM

## 2014-08-14 DIAGNOSIS — Z0000 Encounter for general adult medical examination without abnormal findings: Secondary | ICD-10-CM

## 2014-08-14 DIAGNOSIS — E039 Hypothyroidism, unspecified: Secondary | ICD-10-CM

## 2014-08-14 DIAGNOSIS — E669 Obesity, unspecified: Secondary | ICD-10-CM | POA: Diagnosis not present

## 2014-08-14 DIAGNOSIS — E785 Hyperlipidemia, unspecified: Secondary | ICD-10-CM

## 2014-08-14 DIAGNOSIS — E119 Type 2 diabetes mellitus without complications: Secondary | ICD-10-CM | POA: Diagnosis not present

## 2014-08-14 LAB — POCT URINALYSIS DIPSTICK
Bilirubin, UA: NEGATIVE
Blood, UA: NEGATIVE
GLUCOSE UA: NEGATIVE
Ketones, UA: NEGATIVE
Leukocytes, UA: NEGATIVE
NITRITE UA: NEGATIVE
PH UA: 7.5
Protein, UA: NEGATIVE
Spec Grav, UA: 1.02
Urobilinogen, UA: NEGATIVE

## 2014-08-14 LAB — VITAMIN D 25 HYDROXY (VIT D DEFICIENCY, FRACTURES): VIT D 25 HYDROXY: 56 ng/mL (ref 30–100)

## 2014-08-14 NOTE — Progress Notes (Signed)
Subjective:    Patient ID: Victoria Hess, female    DOB: [birth year], 69 y.o.   MRN: [id number]  HPI 69 year old Black Female in today for health maintenance exam and evaluation of multiple medical issues including hypertension, hypothyroidism, controlled type II diabetes , obesity, hyperlipidemia as well as metabolic syndrome.   History of breast cancer followed by Dr. Jana Hakim with follow-up appointment due with him November. In 2015 was started on metformin for diabetes mellitus. However in November hemoglobin A1c had not changed with the addition of metformin 500 mg twice daily. Hemoglobin A1c was 6.3%. We advised continuing diet and exercise efforts returning now for physical exam.  Weight in 2014 was 185 pounds. Had lost 21 pounds since her last visit at that time. Now weighs 171 pounds which is excellent.  History of tonsillectomy and adenoidectomy 1955. Appendectomy and tubal ligation 1977. History of uterine fibroids, cystocele, uterine prolapse.  History of bilateral ductal carcinoma in situ. She had a wide excision procedure of the left breast in 2011 and a lesser procedure done on the right breast in 2002.  Social history: She does not smoke or consume alcohol. Husband in good health. Married. 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 She lives in [place].  Family history: Father died at age 85 of a stroke. Mother died from coronary artery disease. One brother age 85 with history of hypertension and diabetes. One sister with hypertension and diabetes. Another sister with hypertension. One sister in good health without medical problems.  Needs Zostavax vaccine. Not seen as having received. Tdap  up-to-date. Has had pneumococcal 23. Needs Prevnar.  Eye exam due April and goes yearly.   Review of Systems  Constitutional: Positive for fever.    Eyes: Negative.   Respiratory: Negative.   Cardiovascular: Negative for chest pain.  Gastrointestinal: Negative.   Neurological: Negative.   Psychiatric/Behavioral: Negative.        Objective:   Physical Exam  Constitutional: She is oriented to person, place, and time. She appears well-developed and well-nourished. No distress.  HENT:  Head: Normocephalic and atraumatic.  Right Ear: External ear normal.  Left Ear: External ear normal.  Mouth/Throat: Oropharynx is clear and moist. No oropharyngeal exudate.  Eyes: Conjunctivae and EOM are normal. Pupils are equal, round, and reactive to light. Right eye exhibits no discharge. Left eye exhibits no discharge.  Neck: Neck supple. No JVD present. No thyromegaly present.  Cardiovascular: Normal rate, regular rhythm, normal heart sounds and intact distal pulses.   No murmur heard. Pulmonary/Chest: Effort normal and breath sounds normal. No respiratory distress. She has no wheezes. She has no rales. She exhibits no tenderness.  Abdominal: Soft. Bowel sounds are normal. She exhibits no distension and no mass. There is no tenderness. There is no rebound and no guarding.  Genitourinary:  Pap done 2013. Bimanual normal. Pap done today but does not need to be repeated in the future due to age recommendations  Musculoskeletal: She exhibits no edema.  Lymphadenopathy:    She has no cervical adenopathy.  Neurological: She is alert and oriented to person, place, and time. She has normal reflexes. No cranial nerve deficit. Coordination normal.  Skin: Skin is warm and dry. No rash noted. She is not diaphoretic.  Psychiatric: She has a normal mood and affect. Her behavior is normal. Judgment and thought content normal.  Vitals reviewed.  Assessment & Plan:  Controlled type 2 diabetes mellitus-hemoglobin A1c 6.2 percent. Continue metformin and diet and exercise. Recheck in 6 months.  Obesity-pleased with weight loss  efforts.  Hyperlipidemia-lipid panel stable on statin medication  Essential hypertension-is on 3 drug regimen including by stolid, amlodipine, diarrhetic  Hypothyroidism-TSH within normal limits on low dose Synthroid  Metabolic syndrome  Breast cancer-on Femara and followed by oncologist  Elevated serum potassium-likely due to lab error. Potassium is 5.5 and we've been seeing some of these recently. Follow-up in 6 months. Normal is 5.3  Plan: Continue follow-up with Dr. Jana Hakim. Follow-up in 6 months with office visit, TSH, lipid panel, liver functions, basic metabolic panel, hemoglobin A1c and blood pressure check

## 2014-08-15 LAB — MICROALBUMIN / CREATININE URINE RATIO
CREATININE, URINE: 78.3 mg/dL
MICROALB UR: 0.4 mg/dL (ref <2.0)
Microalb Creat Ratio: 5.1 mg/g (ref 0.0–30.0)

## 2014-08-18 LAB — CYTOLOGY - PAP

## 2014-10-11 ENCOUNTER — Encounter: Payer: Self-pay | Admitting: Internal Medicine

## 2014-10-11 NOTE — Patient Instructions (Signed)
Notify us of date of Zostavax vaccine and I exam. Return in 6 months with follow-up labs. See Dr. Jana Hakim in November. Continue diet exercise and weight loss efforts.

## 2014-10-14 ENCOUNTER — Telehealth: Payer: Self-pay | Admitting: *Deleted

## 2014-10-14 NOTE — Telephone Encounter (Signed)
Left message for patient to call back with date of last eye exam

## 2014-11-24 ENCOUNTER — Encounter: Payer: Self-pay | Admitting: Genetic Counselor

## 2014-12-08 ENCOUNTER — Ambulatory Visit (INDEPENDENT_AMBULATORY_CARE_PROVIDER_SITE_OTHER): Payer: Managed Care, Other (non HMO) | Admitting: Podiatry

## 2014-12-08 ENCOUNTER — Encounter: Payer: Self-pay | Admitting: Podiatry

## 2014-12-08 VITALS — BP 150/93 | HR 65 | Resp 16 | Ht 63.5 in | Wt 170.0 lb

## 2014-12-08 DIAGNOSIS — B351 Tinea unguium: Secondary | ICD-10-CM

## 2014-12-08 DIAGNOSIS — M79676 Pain in unspecified toe(s): Secondary | ICD-10-CM

## 2014-12-08 NOTE — Progress Notes (Signed)
Subjective:     Patient ID: Victoria Hess, female   DOB: 1946-02-09, 69 y.o.   MRN: [id number]  HPI 69 year old female presents after with complaints of thick, painful, elongated toenails which she is able to trim herself. She denies any redness or drainage on the nail sites.  She is diabetic and states her last HbA1c was 6.3. She denies any history of ulceration, tingling or numbness. Denies any claudication symptoms. No other complaints at this time.  Review of Systems  All other systems reviewed and are negative.      Objective:   Physical Exam AAO x3, NAD DP/PT pulses palpable bilaterally, CRT less than 3 seconds Protective sensation intact with Simms Weinstein monofilament, vibratory sensation intact, Achilles tendon reflex intact Nails are hypertrophic, dystrophic, brittle, discolored, elongated 10. There is tenderness to palpation Nails 1-5 bilaterally. There is no swelling erythema or drainage on the nail sites. There is significant HAV deformity present bilaterally have there is no tenderness to palpation along the bunion this time in the. Asymptomatic. Noother  areas of tenderness to bilateral lower extremities. MMT 5/5, ROM WNL.  No open lesions or pre-ulcerative lesions.  No overlying edema, erythema, increase in warmth to bilateral lower extremities.  No pain with calf compression, swelling, warmth, erythema bilaterally.      Assessment:     69 year old female with symptomatic onychomycosis     Plan:     -Treatment options discussed including all alternatives, risks, and complications -Nail sharply debrided 10 without competitions/bleeding  Discussed treatment options for onychomycosis. She states that if she tried treatment she would likely get over-the-counter. I discussed sponges nail. -Discussed the importance of daily foot inspection -Follow-up 3 months or sooner if any problems arise. In the meantime, encouraged to call the office with any questions, concerns,  change in symptoms.   Celesta Gentile, DPM

## 2014-12-08 NOTE — Patient Instructions (Signed)
Over the counter medication for nail fungus is called fungi-nail  Diabetes and Foot Care Diabetes may cause you to have problems because of poor blood supply (circulation) to your feet and legs. This may cause the skin on your feet to become thinner, break easier, and heal more slowly. Your skin may become dry, and the skin may peel and crack. You may also have nerve damage in your legs and feet causing decreased feeling in them. You may not notice minor injuries to your feet that could lead to infections or more serious problems. Taking care of your feet is one of the most important things you can do for yourself.  HOME CARE INSTRUCTIONS  Wear shoes at all times, even in the house. Do not go barefoot. Bare feet are easily injured.  Check your feet daily for blisters, cuts, and redness. If you cannot see the bottom of your feet, use a mirror or ask someone for help.  Wash your feet with warm water (do not use hot water) and mild soap. Then pat your feet and the areas between your toes until they are completely dry. Do not soak your feet as this can dry your skin.  Apply a moisturizing lotion or petroleum jelly (that does not contain alcohol and is unscented) to the skin on your feet and to dry, brittle toenails. Do not apply lotion between your toes.  Trim your toenails straight across. Do not dig under them or around the cuticle. File the edges of your nails with an emery board or nail file.  Do not cut corns or calluses or try to remove them with medicine.  Wear clean socks or stockings every day. Make sure they are not too tight. Do not wear knee-high stockings since they may decrease blood flow to your legs.  Wear shoes that fit properly and have enough cushioning. To break in new shoes, wear them for just a few hours a day. This prevents you from injuring your feet. Always look in your shoes before you put them on to be sure there are no objects inside.  Do not cross your legs. This may  decrease the blood flow to your feet.  If you find a minor scrape, cut, or break in the skin on your feet, keep it and the skin around it clean and dry. These areas may be cleansed with mild soap and water. Do not cleanse the area with peroxide, alcohol, or iodine.  When you remove an adhesive bandage, be sure not to damage the skin around it.  If you have a wound, look at it several times a day to make sure it is healing.  Do not use heating pads or hot water bottles. They may burn your skin. If you have lost feeling in your feet or legs, you may not know it is happening until it is too late.  Make sure your health care provider performs a complete foot exam at least annually or more often if you have foot problems. Report any cuts, sores, or bruises to your health care provider immediately. SEEK MEDICAL CARE IF:   You have an injury that is not healing.  You have cuts or breaks in the skin.  You have an ingrown nail.  You notice redness on your legs or feet.  You feel burning or tingling in your legs or feet.  You have pain or cramps in your legs and feet.  Your legs or feet are numb.  Your feet always feel cold.  SEEK IMMEDIATE MEDICAL CARE IF:   There is increasing redness, swelling, or pain in or around a wound.  There is a red line that goes up your leg.  Pus is coming from a wound.  You develop a fever or as directed by your health care provider.  You notice a bad smell coming from an ulcer or wound. Document Released: 04/28/2000 Document Revised: 01/01/2013 Document Reviewed: 10/08/2012 [facility] Patient Information 2015 [place], [place]. This information is not intended to replace advice given to you by your health care provider. Make sure you discuss any questions you have with your health care provider.

## 2014-12-09 ENCOUNTER — Other Ambulatory Visit: Payer: Self-pay | Admitting: *Deleted

## 2014-12-09 MED ORDER — FUROSEMIDE 40 MG PO TABS: 40.0000 mg | ORAL_TABLET | Freq: Every day | ORAL | Status: DC

## 2014-12-09 NOTE — Telephone Encounter (Signed)
Lasix refilled.

## 2014-12-25 ENCOUNTER — Other Ambulatory Visit: Payer: Self-pay | Admitting: Oncology

## 2014-12-25 DIAGNOSIS — Z9889 Other specified postprocedural states: Secondary | ICD-10-CM

## 2014-12-25 DIAGNOSIS — Z853 Personal history of malignant neoplasm of breast: Secondary | ICD-10-CM

## 2015-01-01 ENCOUNTER — Other Ambulatory Visit: Payer: Self-pay | Admitting: *Deleted

## 2015-01-01 MED ORDER — POTASSIUM CHLORIDE CRYS ER 20 MEQ PO TBCR: 20.0000 meq | EXTENDED_RELEASE_TABLET | Freq: Two times a day (BID) | ORAL | Status: DC

## 2015-01-01 NOTE — Telephone Encounter (Signed)
Potassium refilled

## 2015-02-03 ENCOUNTER — Ambulatory Visit
Admission: RE | Admit: 2015-02-03 | Discharge: 2015-02-03 | Disposition: A | Discharge status: Home / Self Care | Payer: Medicare Other | Source: Ambulatory Visit | Attending: Oncology | Admitting: Oncology

## 2015-02-03 DIAGNOSIS — R928 Other abnormal and inconclusive findings on diagnostic imaging of breast: Secondary | ICD-10-CM | POA: Diagnosis not present

## 2015-02-03 DIAGNOSIS — Z9889 Other specified postprocedural states: Secondary | ICD-10-CM

## 2015-02-03 DIAGNOSIS — Z853 Personal history of malignant neoplasm of breast: Secondary | ICD-10-CM

## 2015-02-16 ENCOUNTER — Other Ambulatory Visit: Payer: Managed Care, Other (non HMO) | Admitting: Internal Medicine

## 2015-02-16 DIAGNOSIS — E119 Type 2 diabetes mellitus without complications: Secondary | ICD-10-CM

## 2015-02-16 DIAGNOSIS — E039 Hypothyroidism, unspecified: Secondary | ICD-10-CM

## 2015-02-16 DIAGNOSIS — Z79899 Other long term (current) drug therapy: Secondary | ICD-10-CM

## 2015-02-16 DIAGNOSIS — E785 Hyperlipidemia, unspecified: Secondary | ICD-10-CM

## 2015-02-16 LAB — LIPID PANEL
Cholesterol: 179 mg/dL (ref 125–200)
HDL: 98 mg/dL (ref >=46)
LDL Cholesterol: 68 mg/dL (ref <130)
Total CHOL/HDL Ratio: 1.8 Ratio (ref <=5.0)
Triglycerides: 63 mg/dL (ref <150)
VLDL: 13 mg/dL (ref <30)

## 2015-02-16 LAB — BASIC METABOLIC PANEL
BUN: 19 mg/dL (ref 7–25)
CO2: 28 mmol/L (ref 20–31)
Calcium: 10.2 mg/dL (ref 8.6–10.4)
Chloride: 100 mmol/L (ref 98–110)
Creat: 0.85 mg/dL (ref 0.50–0.99)
Glucose, Bld: 89 mg/dL (ref 65–99)
Potassium: 4.2 mmol/L (ref 3.5–5.3)
SODIUM: 138 mmol/L (ref 135–146)

## 2015-02-16 LAB — HEPATIC FUNCTION PANEL
ALK PHOS: 71 U/L (ref 33–130)
ALT: 11 U/L (ref 6–29)
AST: 16 U/L (ref 10–35)
Albumin: 4.5 g/dL (ref 3.6–5.1)
BILIRUBIN INDIRECT: 0.3 mg/dL (ref 0.2–1.2)
BILIRUBIN TOTAL: 0.4 mg/dL (ref 0.2–1.2)
Bilirubin, Direct: 0.1 mg/dL (ref <=0.2)
Total Protein: 7.3 g/dL (ref 6.1–8.1)

## 2015-02-16 LAB — HEMOGLOBIN A1C
Hgb A1c MFr Bld: 6.1 % — ABNORMAL HIGH (ref <5.7)
Mean Plasma Glucose: 128 mg/dL — ABNORMAL HIGH (ref <117)

## 2015-02-16 LAB — TSH: TSH: 1.46 u[IU]/mL (ref 0.350–4.500)

## 2015-02-19 ENCOUNTER — Ambulatory Visit (INDEPENDENT_AMBULATORY_CARE_PROVIDER_SITE_OTHER): Payer: Managed Care, Other (non HMO) | Admitting: Internal Medicine

## 2015-02-19 ENCOUNTER — Encounter: Payer: Self-pay | Admitting: Internal Medicine

## 2015-02-19 VITALS — BP 126/72 | HR 68 | Temp 98.0°F | Ht 64.0 in | Wt 169.0 lb

## 2015-02-19 DIAGNOSIS — I10 Essential (primary) hypertension: Secondary | ICD-10-CM | POA: Diagnosis not present

## 2015-02-19 DIAGNOSIS — E119 Type 2 diabetes mellitus without complications: Secondary | ICD-10-CM | POA: Diagnosis not present

## 2015-02-19 DIAGNOSIS — E039 Hypothyroidism, unspecified: Secondary | ICD-10-CM | POA: Diagnosis not present

## 2015-02-19 DIAGNOSIS — Z23 Encounter for immunization: Secondary | ICD-10-CM | POA: Diagnosis not present

## 2015-02-19 DIAGNOSIS — Z853 Personal history of malignant neoplasm of breast: Secondary | ICD-10-CM

## 2015-02-19 DIAGNOSIS — E785 Hyperlipidemia, unspecified: Secondary | ICD-10-CM | POA: Diagnosis not present

## 2015-02-19 NOTE — Progress Notes (Signed)
   Subjective:    Patient ID: Victoria Hess, female    DOB: 22-Jan-1946, 69 y.o.   MRN: [id number]  HPI 69 year old Female for 6 month recheck. Hx HTN, hypothyroidism, hyperlipidemia, controlled type 2 diabetes without complication. Feels well with no new complaints. History of breast cancer followed by Dr. Jana Hakim for bilateral ductal carcinoma in situ  2011. Was started on metformin for diabetes in 2015. Weight in 2014 was 181 pounds. Now weighs 169 pounds.  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 She lives in [place].  No new complaints or problems    Review of Systems     Objective:   Physical Exam Skin warm and dry. Nodes none. Neck is supple without JVD thyromegaly or carotid bruits. Chest clear to auscultation. Cardiac exam regular rate and rhythm normal S1 and S2. Extremities without edema.       Assessment & Plan:  Essential hypertension-stable  Hypothyroidism-TSH within normal limits  Hyperlipidemia  Controlled type 2 diabetes without complication and without insulin-hemoglobin A1c 6.1%  Obesity  History of breast cancer  Plan: I am extremely pleased with lab work today. Lipid panel is normal and hemoglobin A1c 6.1%. TSH is normal. Return in 6 months for physical exam. Reminded about diabetic eye exam.

## 2015-02-19 NOTE — Patient Instructions (Signed)
It was a pleasure today. Keep up the good work. RTC 6 months for CPE. Continue same medications.

## 2015-02-20 LAB — MICROALBUMIN / CREATININE URINE RATIO
CREATININE, URINE: 105.3 mg/dL
MICROALB/CREAT RATIO: 2.8 mg/g (ref 0.0–30.0)
Microalb, Ur: 0.3 mg/dL (ref <2.0)

## 2015-03-13 ENCOUNTER — Encounter: Payer: Self-pay | Admitting: Internal Medicine

## 2015-03-16 ENCOUNTER — Ambulatory Visit (INDEPENDENT_AMBULATORY_CARE_PROVIDER_SITE_OTHER): Payer: Managed Care, Other (non HMO) | Admitting: Sports Medicine

## 2015-03-16 ENCOUNTER — Ambulatory Visit: Payer: Managed Care, Other (non HMO)

## 2015-03-16 ENCOUNTER — Encounter: Payer: Self-pay | Admitting: Sports Medicine

## 2015-03-16 DIAGNOSIS — E1169 Type 2 diabetes mellitus with other specified complication: Secondary | ICD-10-CM

## 2015-03-16 DIAGNOSIS — M79674 Pain in right toe(s): Secondary | ICD-10-CM | POA: Diagnosis not present

## 2015-03-16 DIAGNOSIS — B351 Tinea unguium: Secondary | ICD-10-CM

## 2015-03-16 DIAGNOSIS — M79675 Pain in left toe(s): Secondary | ICD-10-CM

## 2015-03-16 DIAGNOSIS — E119 Type 2 diabetes mellitus without complications: Secondary | ICD-10-CM

## 2015-03-16 DIAGNOSIS — L84 Corns and callosities: Secondary | ICD-10-CM

## 2015-03-16 DIAGNOSIS — E669 Obesity, unspecified: Secondary | ICD-10-CM

## 2015-03-16 NOTE — Progress Notes (Signed)
Patient ID: Victoria Hess, female   DOB: [birth year], 69 y.o.   MRN: [id number] Subjective: Victoria Hess is a 69 y.o. female patient with history of type 2 diabetes who presents to office today complaining of a painful callus and long, painful nails  while ambulating in shoes; unable to trim. Patient states that the glucose reading this morning was 89 mg/dl. Patient denies any new changes in medication or new problems. Patient denies any new cramping, numbness, burning or tingling in the legs.  Patient Active Problem List   Diagnosis Date Noted  . Metabolic syndrome 96/75/9163  . Breast cancer, left breast ([place]) 04/06/2014  . Hyperlipidemia 08/12/2011  . Anemia 08/12/2011  . Hypothyroidism 08/12/2011  . Obesity 08/12/2011  . Diabetes mellitus type 2 in obese ([place]) 08/12/2011  . Hypertension 05/15/2011   Current Outpatient Prescriptions on File Prior to Visit  Medication Sig Dispense Refill  . amLODipine (NORVASC) 10 MG tablet Take 1 tablet (10 mg total) by mouth daily. 90 tablet 3  . aspirin 81 MG tablet Take 81 mg by mouth daily.      . Blood Glucose Monitoring Suppl (ONE TOUCH ULTRA SYSTEM KIT) W/DEVICE KIT 1 kit by Does not apply route once. Test bid.  DX E11.9 1 each 0  . BYSTOLIC 10 MG tablet     . Calcium Carbonate (CALCIUM 600 PO) Take by mouth daily.      . Cholecalciferol (VITAMIN D PO) Take 2,000 mg by mouth daily.      . furosemide (LASIX) 40 MG tablet Take 1 tablet (40 mg total) by mouth daily. 90 tablet 3  . glucose blood test strip Test BID.  DX E11.9 100 each 12  . Lancets (ONETOUCH ULTRASOFT) lancets Test BID.  DX E11.9 100 each 12  . letrozole (FEMARA) 2.5 MG tablet Take 1 tablet (2.5 mg total) by mouth daily. 90 tablet 3  . levothyroxine (SYNTHROID, LEVOTHROID) 50 MCG tablet Take 1 tablet (50 mcg total) by mouth daily. 90 tablet 0  . metFORMIN (GLUCOPHAGE) 500 MG tablet Take 1 tablet (500 mg total) by mouth 2 (two) times daily with a meal. 60 tablet 11  . Multiple  Vitamin (MULTIVITAMIN PO) Take by mouth daily.      . Nebivolol HCl (BYSTOLIC) 20 MG TABS Take 1 tablet (20 mg total) by mouth daily. 60 tablet 5  . ofloxacin (OCUFLOX) 0.3 % ophthalmic solution 2 drops each eye 4 times daily x 5 days 5 mL 0  . potassium chloride SA (K-DUR,KLOR-CON) 20 MEQ tablet Take 1 tablet (20 mEq total) by mouth 2 (two) times daily. 60 tablet 2  . simvastatin (ZOCOR) 10 MG tablet Take 1 tablet (10 mg total) by mouth at bedtime. 90 tablet 1   No current facility-administered medications on file prior to visit.   No Known Allergies  Labs:HEMOGLOBIN A1C- 6.1   Objective: General: Patient is awake, alert, and oriented x 3 and in no acute distress.  Integument: Skin is warm, dry and supple bilateral. Nails are tender, long, thickened and  dystrophic with subungual debris, consistent with onychomycosis, 1-5 bilateral. Callus present sub 2 on right foot with no signs of infection. No open lesions. Remaining integument unremarkable.  Vasculature:  Dorsalis Pedis pulse 2/4 bilateral. Posterior Tibial pulse 1/4 bilateral.  Capillary fill time <3 sec 1-5 bilateral. Scant hair growth to the level of the digits. Temperature gradient within normal limits. No varicosities present bilateral. No edema present bilateral.   Neurology: The patient has intact  sensation measured with a 5.07/10g Semmes Weinstein Monofilament at all pedal sites bilateral . Vibratory sensation intact bilateral with tuning fork. No Babinski sign present bilateral.   Musculoskeletal: Significant HAV pedal deformities noted bilateral with no pain or tenderness. Muscular strength 5/5 in all lower extremity muscular groups bilateral without pain or limitation on range of motion. No tenderness with calf compression bilateral.  Assessment and Plan: Problem List Items Addressed This Visit      Endocrine   Diabetes mellitus type 2 in obese ([place])    Other Visit Diagnoses    Dermatophytosis of nail    -  Primary     Callus of foot        Right sub 2    Pain in toes of both feet        Diabetes mellitus without complication ([place])          -Examined patient. -Discussed and educated patient on diabetic foot care, especially with  regards to the vascular, neurological and musculoskeletal systems.  -Stressed the importance of good glycemic control and the detriment of not  controlling glucose levels in relation to the foot. -Mechanically debrided all nails 1-5 bilateral using sterile nail nipper and filed with dremel without incident -Debrided callus x1 using sterile chisel blade without incident.  -Answered all patient questions -Patient to return in 3 months for at risk foot care -Patient advised to call the office if any problems or questions arise in the  Meantime.  Landis Martins, DPM

## 2015-04-05 ENCOUNTER — Other Ambulatory Visit: Payer: Self-pay

## 2015-04-05 MED ORDER — LEVOTHYROXINE SODIUM 50 MCG PO TABS
50.0000 ug | ORAL_TABLET | Freq: Every day | ORAL | Status: DC
Start: 2015-04-05 — End: 2015-07-05

## 2015-04-07 ENCOUNTER — Other Ambulatory Visit: Payer: Self-pay | Admitting: *Deleted

## 2015-04-07 DIAGNOSIS — C50912 Malignant neoplasm of unspecified site of left female breast: Secondary | ICD-10-CM

## 2015-04-12 ENCOUNTER — Other Ambulatory Visit (HOSPITAL_BASED_OUTPATIENT_CLINIC_OR_DEPARTMENT_OTHER): Payer: Managed Care, Other (non HMO)

## 2015-04-12 ENCOUNTER — Ambulatory Visit (HOSPITAL_BASED_OUTPATIENT_CLINIC_OR_DEPARTMENT_OTHER): Payer: Medicare Other | Admitting: Oncology

## 2015-04-12 VITALS — BP 166/73 | HR 58 | Temp 97.8°F | Resp 18 | Ht 64.0 in | Wt 171.4 lb

## 2015-04-12 DIAGNOSIS — Z853 Personal history of malignant neoplasm of breast: Secondary | ICD-10-CM | POA: Diagnosis not present

## 2015-04-12 DIAGNOSIS — C50912 Malignant neoplasm of unspecified site of left female breast: Secondary | ICD-10-CM

## 2015-04-12 LAB — COMPREHENSIVE METABOLIC PANEL (CC13)
ALBUMIN: 4 g/dL (ref 3.5–5.0)
ALK PHOS: 89 U/L (ref 40–150)
ALT: 11 U/L (ref 0–55)
ANION GAP: 10 meq/L (ref 3–11)
AST: 18 U/L (ref 5–34)
BUN: 25.4 mg/dL (ref 7.0–26.0)
CALCIUM: 10 mg/dL (ref 8.4–10.4)
CO2: 27 mEq/L (ref 22–29)
Chloride: 104 mEq/L (ref 98–109)
Creatinine: 0.9 mg/dL (ref 0.6–1.1)
EGFR: 79 mL/min/{1.73_m2} — AB (ref >90)
GLUCOSE: 88 mg/dL (ref 70–140)
POTASSIUM: 3.9 meq/L (ref 3.5–5.1)
Sodium: 141 mEq/L (ref 136–145)
TOTAL PROTEIN: 7.8 g/dL (ref 6.4–8.3)

## 2015-04-12 LAB — CBC WITH DIFFERENTIAL/PLATELET
BASO%: 0.7 % (ref 0.0–2.0)
BASOS ABS: 0 10*3/uL (ref 0.0–0.1)
EOS ABS: 0.2 10*3/uL (ref 0.0–0.5)
EOS%: 2.5 % (ref 0.0–7.0)
HEMATOCRIT: 35.7 % (ref 34.8–46.6)
HEMOGLOBIN: 11.3 g/dL — AB (ref 11.6–15.9)
LYMPH#: 1.6 10*3/uL (ref 0.9–3.3)
LYMPH%: 23.5 % (ref 14.0–49.7)
MCH: 27.7 pg (ref 25.1–34.0)
MCHC: 31.6 g/dL (ref 31.5–36.0)
MCV: 87.5 fL (ref 79.5–101.0)
MONO#: 0.7 10*3/uL (ref 0.1–0.9)
MONO%: 9.4 % (ref 0.0–14.0)
NEUT%: 63.9 % (ref 38.4–76.8)
NEUTROS ABS: 4.4 10*3/uL (ref 1.5–6.5)
PLATELETS: 235 10*3/uL (ref 145–400)
RBC: 4.08 10*6/uL (ref 3.70–5.45)
RDW: 14.9 % — AB (ref 11.2–14.5)
WBC: 6.9 10*3/uL (ref 3.9–10.3)

## 2015-04-12 NOTE — Progress Notes (Signed)
ID: Victoria Hess   DOB: 06/05/1945  MR#: [id number]  CSN#:[id number]   PCP:  Elby Showers, MD GYN: SURRolm Bookbinder, MD OTHER:   CHIEF COMPLAINT:  Bilateral Breast Cancer  CURRENT TREATMENT:  Completing 5 years of letrozole   HISTORY OF PRESENT ILLNESS: From the original intake note:\  The patient has a history of prior right lumpectomy and radiation therapy (2002) for what by her account was a Stage 0 ductal carcinoma in situ.  More recently, she had diagnostic mammography at [facility] on December 31, 2009 and this showed group calcifications in the left breast felt to be moderately suspicious for malignancy.  The patient was set up for stereotactic biopsy 01/04/2010 and the pathology report [id number] at the [facility]) showed a small focus of invasive adenocarcinoma, grade 1, with some evidence of in situ carcinoma.  The tumor was estrogen receptor positive at 100%, progesterone receptor positive at 100% and there was no Her-2 amplification by the Hercept test with a score of 1+.    Bilateral breast MRIs were performed February 04, 2010.  There was some postoperative change in the upper outer quadrant of the right breast with no suspicious findings.  In the left breast, lower outer quadrant, there was an irregular spiculated enhancing mass measuring 2.3 cm maximally.  There were no other masses associated with this and there were no suspicious internal mammary or axillary lymph nodes noted.    With this information, the patient was referred to Dr. Donne Hazel and he set her up for genetic testing which came back favorable (no BRCA-1 or BRCA-2 mutations detected).  He then set her up for lumpectomy and sentinel lymph node dissection which was performed March 07, 2010.  The final pathology from that procedure ([id number]) showed a Grade 1 invasive ductal carcinoma measuring 1.5 cm with margins negative and ample.  There was no lymphovascular  invasion.  One of four sentinel lymph nodes sampled had a micrometastatic deposit.  Repeat prognostic panel showed the tumor to be ER positive at 100%, PR positive at 66% and showed no Her-2 amplification by CISH with a ratio of 1.24.   Her subsequent history is as detailed below  INTERVAL HISTORY: Victoria Hess returns today for follow-up of her estrogen receptor positivebreast cancers. She has no symptoms to report regarding letrozole and particularly hot flashes and vaginal dryness are not concerns. She obtains a drug at a very good price.  REVIEW OF SYSTEMS: She still has some pain where she fell a couple years ago and injured her left foot,and also some joints hurt here andthere, but this is not more intense or persistent than prior. Her blood sugars are "okay". A detailed review of systems today was otherwise stable.  PAST MEDICAL HISTORY: Past Medical History  Diagnosis Date  . Hypertension   . Wears glasses   . Wears dentures     partial  . Cancer ([place])     Stage 2 breast cancer  . Thyroid disease     hypothyroidism  . Hyperlipidemia     PAST SURGICAL HISTORY: Past Surgical History  Procedure Laterality Date  . Tubal ligation    . Appendectomy    . Breast surgery      left lumpectomy sentinel  node biopsy  . Breast surgery      right lumpectomy    FAMILY HISTORY Family History  Problem Relation Age of Onset  . Diabetes Mother   . Heart disease Mother   .  Diabetes Father   . Cancer Father   . Diabetes Sister   . Hyperlipidemia Sister   . Cancer Sister     breast     GYNECOLOGIC HISTORY: The patient is GX P1, first pregnancy to term at age 60. She went through the change of life around the year 2000.  She did not receive hormone replacement.  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  Her husband of 44+ years, Wilber Oliphant, works in Production assistant, radio.  Daughter Angela Nevin works as a temp.  The patient has two  granddaughters, aged 84 and 87.  She is a Psychologist, forensic.    ADVANCED DIRECTIVES: not in place  HEALTH MAINTENANCE: (Updated 03/10/2013) Social History  Substance Use Topics  . Smoking status: Never Smoker   . Smokeless tobacco: Never Used  . Alcohol Use: No     Colonoscopy: UTD (Not on file)  PAP: Not on file  Bone density: 2013/ normal  Lipid panel: April 2014, Dr. Renold Genta  No Known Allergies  Current Outpatient Prescriptions  Medication Sig Dispense Refill  . amLODipine (NORVASC) 10 MG tablet Take 1 tablet (10 mg total) by mouth daily. 90 tablet 3  . aspirin 81 MG tablet Take 81 mg by mouth daily.      . Blood Glucose Monitoring Suppl (ONE TOUCH ULTRA SYSTEM KIT) W/DEVICE KIT 1 kit by Does not apply route once. Test bid.  DX E11.9 1 each 0  . BYSTOLIC 10 MG tablet     . Calcium Carbonate (CALCIUM 600 PO) Take by mouth daily.      . Cholecalciferol (VITAMIN D PO) Take 2,000 mg by mouth daily.      . furosemide (LASIX) 40 MG tablet Take 1 tablet (40 mg total) by mouth daily. 90 tablet 3  . glucose blood test strip Test BID.  DX E11.9 100 each 12  . Lancets (ONETOUCH ULTRASOFT) lancets Test BID.  DX E11.9 100 each 12  . letrozole (FEMARA) 2.5 MG tablet Take 1 tablet (2.5 mg total) by mouth daily. 90 tablet 3  . levothyroxine (SYNTHROID, LEVOTHROID) 50 MCG tablet Take 1 tablet (50 mcg total) by mouth daily. 90 tablet 1  . metFORMIN (GLUCOPHAGE) 500 MG tablet Take 1 tablet (500 mg total) by mouth 2 (two) times daily with a meal. 60 tablet 11  . Multiple Vitamin (MULTIVITAMIN PO) Take by mouth daily.      . Nebivolol HCl (BYSTOLIC) 20 MG TABS Take 1 tablet (20 mg total) by mouth daily. 60 tablet 5  . ofloxacin (OCUFLOX) 0.3 % ophthalmic solution 2 drops each eye 4 times daily x 5 days 5 mL 0  . potassium chloride SA (K-DUR,KLOR-CON) 20 MEQ tablet Take 1 tablet (20 mEq total) by mouth 2 (two) times daily. 60 tablet 2  . simvastatin (ZOCOR) 10 MG tablet Take 1 tablet (10 mg total) by mouth at  bedtime. 90 tablet 1   No current facility-administered medications for this visit.    OBJECTIVE: Middle-aged African American womanin no acute distress  Filed Vitals:   04/12/15 1541  BP: 166/73  Pulse: 58  Temp: 97.8 F (36.6 C)  Resp: 18     Body mass index is 29.41 kg/(m^2).    ECOG FS: 0 Filed Weights   04/12/15 1541  Weight: 171 lb 6.4 oz (77.747 kg)   Sclerae unicteric, EOMs intact Oropharynx clear, dentition in good repair No cervical or supraclavicular adenopathy Lungs no rales or  rhonchi Heart regular rate and rhythm Abd soft, nontender, positive bowel sounds MSK no focal spinal tenderness, no upper extremity lymphedema Neuro: nonfocal, well oriented, appropriate affect Breasts: both breasts are status post lumpectomy and radiation. There is no evidence of local recurrence on either side. Both axillae are benign.  LAB RESULTS: Lab Results  Component Value Date   WBC 6.9 04/12/2015   NEUTROABS 4.4 04/12/2015   HGB 11.3* 04/12/2015   HCT 35.7 04/12/2015   MCV 87.5 04/12/2015   PLT 235 04/12/2015      Chemistry      Component Value Date/Time   NA 138 02/16/2015 0908   NA 140 03/30/2014 1520   K 4.2 02/16/2015 0908   K 3.6 03/30/2014 1520   CL 100 02/16/2015 0908   CL 102 02/29/2012 1517   CO2 28 02/16/2015 0908   CO2 26 03/30/2014 1520   BUN 19 02/16/2015 0908   BUN 22.7 03/30/2014 1520   CREATININE 0.85 02/16/2015 0908   CREATININE 0.9 03/30/2014 1520   CREATININE 0.86 08/16/2011 1358      Component Value Date/Time   CALCIUM 10.2 02/16/2015 0908   CALCIUM 10.0 03/30/2014 1520   ALKPHOS 71 02/16/2015 0908   ALKPHOS 88 03/30/2014 1520   AST 16 02/16/2015 0908   AST 16 03/30/2014 1520   ALT 11 02/16/2015 0908   ALT 13 03/30/2014 1520   BILITOT 0.4 02/16/2015 0908   BILITOT 0.29 03/30/2014 1520       Lab Results  Component Value Date   LABCA2 34 02/29/2012      STUDIES: CLINICAL DATA: 69 year old female presenting for  routine postlumpectomy follow-up. The patient has history of a left breast lumpectomy in 2011, and a more remote right breast lumpectomy in 2002.  EXAM: DIGITAL DIAGNOSTIC BILATERAL MAMMOGRAM WITH 3D TOMOSYNTHESIS AND CAD  COMPARISON: Previous exam(s).  ACR Breast Density Category b: There are scattered areas of fibroglandular density.  FINDINGS: Stable lumpectomy sites in the bilateral breasts. No new suspicious masses, calcifications or areas of distortion are identified bilaterally.  Mammographic images were processed with CAD.  IMPRESSION: Stable bilateral lumpectomy sites. No evidence of malignancy in the bilateral breasts.  RECOMMENDATION: Diagnostic mammogram is suggested in 1 year. (Code:DM-B-01Y)  I have discussed the findings and recommendations with the patient. Results were also provided in writing at the conclusion of the visit. If applicable, a reminder letter will be sent to the patient regarding the next appointment.  BI-RADS CATEGORY 2: Benign.   Electronically Signed  By: Ammie Ferrier M.D.  On: 02/03/2015 17:21  ASSESSMENT: 69 y.o.  [place] woman: 1. Status post right lumpectomy 2002 for what likely was a ductal carcinoma in situ, treated with radiation under [facility]. 2. Status post left lumpectomy October 2011 for a T1c N1 (mic) stage IB invasive ductal carcinoma, grade 1, strongly estrogen and progesterone receptor positive, HER2/neu negative.  Margins were ample. 3.  radiation therapy completed in January 2012 4. started letrozole January of 2012, completing 5 years December 2017 5. [facility].   PLAN: Tiffanny is now a little over 5 years out from definitive surgery for her second breast cancer. There is no evidence of disease recurrence. This is very favorable.   She is just about at 5 years on the letrozole. We discussed the data that shows that if we go another 5 years  on letrozole the risk of distant disease developing drops by approximately 1%. The sestamibi balance by cost,  side effects, and concerns regarding osteoporosis.  After this discussion she and I both are comfortable with her stopping letrozole at this time.  Accordingly I am releasing her to her primary care physician. All she will need as far as breast cancer follow-up is concerned is a yearly physician breast exam and yearly mammography.  Of course we will be glad to see her at any point in the future if the need arises,but as of now we are making no further routine appointments for her here.  Chauncey Cruel, MD      04/12/2015

## 2015-04-14 ENCOUNTER — Other Ambulatory Visit: Payer: Self-pay

## 2015-04-14 MED ORDER — POTASSIUM CHLORIDE CRYS ER 20 MEQ PO TBCR: 20.0000 meq | EXTENDED_RELEASE_TABLET | Freq: Two times a day (BID) | ORAL | Status: DC

## 2015-04-22 ENCOUNTER — Other Ambulatory Visit: Payer: Self-pay

## 2015-04-22 MED ORDER — AMLODIPINE BESYLATE 10 MG PO TABS: 10.0000 mg | ORAL_TABLET | Freq: Every day | ORAL | Status: DC

## 2015-04-22 NOTE — Telephone Encounter (Signed)
Last seen in 02/2015

## 2015-05-12 ENCOUNTER — Other Ambulatory Visit: Payer: Self-pay

## 2015-05-12 MED ORDER — METFORMIN HCL 500 MG PO TABS: 500.0000 mg | ORAL_TABLET | Freq: Two times a day (BID) | ORAL | Status: DC

## 2015-06-04 ENCOUNTER — Other Ambulatory Visit: Payer: Self-pay

## 2015-06-04 MED ORDER — NEBIVOLOL HCL 20 MG PO TABS: 20.0000 mg | ORAL_TABLET | Freq: Every day | ORAL | Status: DC

## 2015-06-18 ENCOUNTER — Ambulatory Visit: Payer: Managed Care, Other (non HMO) | Admitting: Sports Medicine

## 2015-06-18 ENCOUNTER — Ambulatory Visit (INDEPENDENT_AMBULATORY_CARE_PROVIDER_SITE_OTHER): Payer: Managed Care, Other (non HMO) | Admitting: Sports Medicine

## 2015-06-18 ENCOUNTER — Encounter: Payer: Self-pay | Admitting: Sports Medicine

## 2015-06-18 DIAGNOSIS — E119 Type 2 diabetes mellitus without complications: Secondary | ICD-10-CM | POA: Diagnosis not present

## 2015-06-18 DIAGNOSIS — M79675 Pain in left toe(s): Secondary | ICD-10-CM

## 2015-06-18 DIAGNOSIS — B351 Tinea unguium: Secondary | ICD-10-CM | POA: Diagnosis not present

## 2015-06-18 DIAGNOSIS — M79674 Pain in right toe(s): Secondary | ICD-10-CM | POA: Diagnosis not present

## 2015-06-18 NOTE — Progress Notes (Signed)
Patient ID: Victoria Hess, female   DOB: Mar 01, 1946, 70 y.o.   MRN: [id number]  Subjective: Victoria Hess is a 70 y.o. female patient with history of type 2 diabetes who presents to office today complaining of a painful callus and long, painful nails  while ambulating in shoes; patient states that she routinely files them and is wondering if she needs to still come for nail trims she she does it herself with no issues. Patient states that the glucose reading this morning was 85 mg/dl. Patient denies any new changes in medication or new problems. Patient denies any new cramping, numbness, burning or tingling in the legs.  Patient Active Problem List   Diagnosis Date Noted  . Metabolic syndrome 40/98/1191  . Breast cancer, left breast ([place]) 04/06/2014  . Hyperlipidemia 08/12/2011  . Anemia 08/12/2011  . Hypothyroidism 08/12/2011  . Obesity 08/12/2011  . Diabetes mellitus type 2 in obese ([place]) 08/12/2011  . Hypertension 05/15/2011   Current Outpatient Prescriptions on File Prior to Visit  Medication Sig Dispense Refill  . amLODipine (NORVASC) 10 MG tablet Take 1 tablet (10 mg total) by mouth daily. 90 tablet 3  . aspirin 81 MG tablet Take 81 mg by mouth daily.      . Blood Glucose Monitoring Suppl (ONE TOUCH ULTRA SYSTEM KIT) W/DEVICE KIT 1 kit by Does not apply route once. Test bid.  DX E11.9 1 each 0  . Calcium Carbonate (CALCIUM 600 PO) Take by mouth daily.      . Cholecalciferol (VITAMIN D PO) Take 2,000 mg by mouth daily.      . furosemide (LASIX) 40 MG tablet Take 1 tablet (40 mg total) by mouth daily. 90 tablet 3  . glucose blood test strip Test BID.  DX E11.9 100 each 12  . Lancets (ONETOUCH ULTRASOFT) lancets Test BID.  DX E11.9 100 each 12  . levothyroxine (SYNTHROID, LEVOTHROID) 50 MCG tablet Take 1 tablet (50 mcg total) by mouth daily. 90 tablet 1  . metFORMIN (GLUCOPHAGE) 500 MG tablet Take 1 tablet (500 mg total) by mouth 2 (two) times daily with a meal. 60 tablet 11  .  Multiple Vitamin (MULTIVITAMIN PO) Take by mouth daily.      . Nebivolol HCl (BYSTOLIC) 20 MG TABS Take 1 tablet (20 mg total) by mouth daily. 60 tablet 11  . ofloxacin (OCUFLOX) 0.3 % ophthalmic solution 2 drops each eye 4 times daily x 5 days 5 mL 0  . potassium chloride SA (K-DUR,KLOR-CON) 20 MEQ tablet Take 1 tablet (20 mEq total) by mouth 2 (two) times daily. 60 tablet 11  . simvastatin (ZOCOR) 10 MG tablet Take 1 tablet (10 mg total) by mouth at bedtime. 90 tablet 1   No current facility-administered medications on file prior to visit.   No Known Allergies    Objective: General: Patient is awake, alert, and oriented x 3 and in no acute distress.  Integument: Skin is warm, dry and supple bilateral. Nails are tender, mildly elongated, thickened and  dystrophic with subungual debris, consistent with onychomycosis, 1-5 bilateral. Mild asymptomatic callus present sub 2 on right foot with no signs of infection. No open lesions. Remaining integument unremarkable.  Vasculature:  Dorsalis Pedis pulse 2/4 bilateral. Posterior Tibial pulse 1/4 bilateral.  Capillary fill time <3 sec 1-5 bilateral. Scant hair growth to the level of the digits. Temperature gradient within normal limits. No varicosities present bilateral. No edema present bilateral.   Neurology: The patient has intact sensation measured with  a 5.07/10g Semmes Weinstein Monofilament at all pedal sites bilateral . Vibratory sensation intact bilateral with tuning fork. No Babinski sign present bilateral.   Musculoskeletal: Significant HAV pedal deformities noted bilateral with no pain or tenderness. Muscular strength 5/5 in all lower extremity muscular groups bilateral without pain or limitation on range of motion. No tenderness with calf compression bilateral.  Assessment and Plan: Problem List Items Addressed This Visit    None    Visit Diagnoses    Dermatophytosis of nail    -  Primary    Pain in toes of both feet         Diabetes mellitus without complication ([place])          -Examined patient. -Discussed and educated patient on diabetic foot care, especially with  regards to the vascular, neurological and musculoskeletal systems.  -Stressed the importance of good glycemic control and the detriment of not  controlling glucose levels in relation to the foot. -Mechanically debrided all nails 1-5 bilateral using sterile nail nipper and filed with dremel without incident -Answered all patient questions -Patient to return as needed/1 year for diabetic foot check or sooner if issues arise. -Patient advised to call the office if any problems or questions arise in the  Meantime.  Landis Martins, DPM

## 2015-07-05 ENCOUNTER — Other Ambulatory Visit: Payer: Self-pay

## 2015-07-05 MED ORDER — LEVOTHYROXINE SODIUM 50 MCG PO TABS: 50.0000 ug | ORAL_TABLET | Freq: Every day | ORAL | Status: DC

## 2015-08-24 ENCOUNTER — Other Ambulatory Visit: Payer: Managed Care, Other (non HMO) | Admitting: Internal Medicine

## 2015-08-24 DIAGNOSIS — E039 Hypothyroidism, unspecified: Secondary | ICD-10-CM

## 2015-08-24 DIAGNOSIS — E559 Vitamin D deficiency, unspecified: Secondary | ICD-10-CM

## 2015-08-24 DIAGNOSIS — E785 Hyperlipidemia, unspecified: Secondary | ICD-10-CM

## 2015-08-24 DIAGNOSIS — I10 Essential (primary) hypertension: Secondary | ICD-10-CM

## 2015-08-24 DIAGNOSIS — E119 Type 2 diabetes mellitus without complications: Secondary | ICD-10-CM

## 2015-08-24 DIAGNOSIS — Z0000 Encounter for general adult medical examination without abnormal findings: Secondary | ICD-10-CM

## 2015-08-24 LAB — HEMOGLOBIN A1C
Hgb A1c MFr Bld: 6 % — ABNORMAL HIGH (ref <5.7)
Mean Plasma Glucose: 126 mg/dL

## 2015-08-24 LAB — CBC WITH DIFFERENTIAL/PLATELET
BASOS ABS: 64 {cells}/uL (ref 0–200)
BASOS PCT: 1 %
EOS PCT: 3 %
Eosinophils Absolute: 192 cells/uL (ref 15–500)
HCT: 35.1 % (ref 35.0–45.0)
HEMOGLOBIN: 11.5 g/dL — AB (ref 11.7–15.5)
LYMPHS ABS: 1536 {cells}/uL (ref 850–3900)
Lymphocytes Relative: 24 %
MCH: 27.8 pg (ref 27.0–33.0)
MCHC: 32.8 g/dL (ref 32.0–36.0)
MCV: 85 fL (ref 80.0–100.0)
MPV: 11.5 fL (ref 7.5–12.5)
Monocytes Absolute: 512 cells/uL (ref 200–950)
Monocytes Relative: 8 %
NEUTROS ABS: 4096 {cells}/uL (ref 1500–7800)
Neutrophils Relative %: 64 %
Platelets: 236 10*3/uL (ref 140–400)
RBC: 4.13 MIL/uL (ref 3.80–5.10)
RDW: 15 % (ref 11.0–15.0)
WBC: 6.4 10*3/uL (ref 3.8–10.8)

## 2015-08-24 LAB — COMPLETE METABOLIC PANEL WITH GFR
ALK PHOS: 67 U/L (ref 33–130)
ALT: 10 U/L (ref 6–29)
AST: 16 U/L (ref 10–35)
Albumin: 4.2 g/dL (ref 3.6–5.1)
BUN: 14 mg/dL (ref 7–25)
CHLORIDE: 102 mmol/L (ref 98–110)
CO2: 26 mmol/L (ref 20–31)
Calcium: 9.6 mg/dL (ref 8.6–10.4)
Creat: 0.69 mg/dL (ref 0.50–0.99)
GFR, EST NON AFRICAN AMERICAN: 89 mL/min (ref >=60)
GFR, Est African American: >89 mL/min (ref >=60)
GLUCOSE: 92 mg/dL (ref 65–99)
POTASSIUM: 4.6 mmol/L (ref 3.5–5.3)
SODIUM: 138 mmol/L (ref 135–146)
Total Bilirubin: 0.4 mg/dL (ref 0.2–1.2)
Total Protein: 6.9 g/dL (ref 6.1–8.1)

## 2015-08-24 LAB — LIPID PANEL
CHOL/HDL RATIO: 1.7 ratio (ref <=5.0)
Cholesterol: 177 mg/dL (ref 125–200)
HDL: 106 mg/dL (ref >=46)
LDL CALC: 61 mg/dL (ref <130)
Triglycerides: 49 mg/dL (ref <150)
VLDL: 10 mg/dL (ref <30)

## 2015-08-24 LAB — TSH: TSH: 2.07 m[IU]/L

## 2015-08-25 LAB — VITAMIN D 25 HYDROXY (VIT D DEFICIENCY, FRACTURES): Vit D, 25-Hydroxy: 51 ng/mL (ref 30–100)

## 2015-08-26 ENCOUNTER — Encounter: Payer: Self-pay | Admitting: Internal Medicine

## 2015-08-26 ENCOUNTER — Ambulatory Visit (INDEPENDENT_AMBULATORY_CARE_PROVIDER_SITE_OTHER): Payer: Managed Care, Other (non HMO) | Admitting: Internal Medicine

## 2015-08-26 VITALS — BP 156/72 | HR 58 | Temp 97.3°F | Resp 20 | Ht 64.0 in | Wt 167.5 lb

## 2015-08-26 DIAGNOSIS — E8881 Metabolic syndrome: Secondary | ICD-10-CM

## 2015-08-26 DIAGNOSIS — E785 Hyperlipidemia, unspecified: Secondary | ICD-10-CM

## 2015-08-26 DIAGNOSIS — E039 Hypothyroidism, unspecified: Secondary | ICD-10-CM

## 2015-08-26 DIAGNOSIS — E669 Obesity, unspecified: Secondary | ICD-10-CM | POA: Diagnosis not present

## 2015-08-26 DIAGNOSIS — D0511 Intraductal carcinoma in situ of right breast: Secondary | ICD-10-CM | POA: Diagnosis not present

## 2015-08-26 DIAGNOSIS — I10 Essential (primary) hypertension: Secondary | ICD-10-CM

## 2015-08-26 DIAGNOSIS — Z0000 Encounter for general adult medical examination without abnormal findings: Secondary | ICD-10-CM

## 2015-08-26 DIAGNOSIS — D0512 Intraductal carcinoma in situ of left breast: Secondary | ICD-10-CM | POA: Diagnosis not present

## 2015-08-26 DIAGNOSIS — E119 Type 2 diabetes mellitus without complications: Secondary | ICD-10-CM

## 2015-08-30 ENCOUNTER — Other Ambulatory Visit: Payer: Self-pay

## 2015-08-30 MED ORDER — SIMVASTATIN 10 MG PO TABS: 10.0000 mg | ORAL_TABLET | Freq: Every day | ORAL | Status: DC

## 2015-09-12 NOTE — Progress Notes (Signed)
   Subjective:    Patient ID: Victoria Hess, female    DOB: March 21, 1946, 70 y.o.   MRN: [id number]  HPI 70 year old Black Female in today for health maintenance exam and evaluation of medical issues. Her blood pressure is elevated today but it has been a stressful day for her. She continues to work full-time.  She has a history of hypertension, hypothyroidism, controlled type 2 diabetes, obesity, hyperlipidemia, metabolic syndrome and history of breast cancer followed by Dr. Jana Hakim.  Weight in 2014 was 185 pounds. Weighed 171 pounds in April 2016. Now weighs 167.5 pounds. I am pleased with her diet and exercise efforts.  History of tonsillectomy and adenoidectomy 1955. Appendectomy and tubal ligation 1977. History of uterine fibroids, cystocele, uterine prolapse.  History of bilateral ductal carcinoma in situ. She had a wide excision procedure of the left breast in 2011 and the lesser procedure done on the right breast in 2002.  Social history: Does not smoke or consume alcohol. 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 Lives in [place]. She is married. Husband in good health.   Family history: Father died at 77 of a stroke. Mother died from coronary disease. One brother  with history of hypertension and diabetes. One sister with hypertension and diabetes. Another sister with hypertension. One sister in good health without medical problems.  Reminded regarding eye exam annually. She goes regularly.    Review of Systems  Constitutional: Negative.   All other systems reviewed and are negative.      Objective:   Physical Exam  Constitutional: She is oriented to person, place, and time. She appears well-developed and well-nourished. No distress.  HENT:  Head: Normocephalic and atraumatic.  Right Ear: External ear normal.  Left Ear: External ear normal.    Mouth/Throat: Oropharynx is clear and moist. No oropharyngeal exudate.  Eyes: Conjunctivae and EOM are normal. Pupils are equal, round, and reactive to light. Right eye exhibits no discharge. Left eye exhibits no discharge.  Neck: Neck supple.  Cardiovascular: Normal rate, regular rhythm and normal heart sounds.   No murmur heard. Pulmonary/Chest: Effort normal and breath sounds normal. No respiratory distress. She has no wheezes. She has no rales.  Breasts normal female  Abdominal: She exhibits no distension. There is no tenderness. There is no rebound and no guarding.  Genitourinary:  Pap done 2013 and does not need to be repeated in the future due to age recommendations. Bimanual normal.  Musculoskeletal: She exhibits no edema.  Neurological: She is alert and oriented to person, place, and time. She has normal reflexes. No cranial nerve deficit. Coordination normal.  Skin: Skin is warm and dry. No rash noted. She is not diaphoretic.  Psychiatric: She has a normal mood and affect. Her behavior is normal. Judgment and thought content normal.  Vitals reviewed.         Assessment & Plan:  Normal health maintenance exam  Controlled type 2 diabetes mellitus  Obesity-continue diet and exercise efforts  Hyperlipidemia-stable on statin medication  Essential hypertension-blood pressure elevated today on arrival

## 2015-09-12 NOTE — Patient Instructions (Addendum)
Continue same medications. Keep eye on  blood pressure and call if persistently elevated. Continue same medications. Return in 6 months.

## 2015-09-17 ENCOUNTER — Ambulatory Visit: Payer: Managed Care, Other (non HMO) | Admitting: Sports Medicine

## 2015-12-17 ENCOUNTER — Other Ambulatory Visit: Payer: Self-pay

## 2015-12-17 MED ORDER — FUROSEMIDE 40 MG PO TABS: 40.0000 mg | ORAL_TABLET | Freq: Every day | ORAL | 1 refills | Status: DC

## 2015-12-22 ENCOUNTER — Other Ambulatory Visit: Payer: Self-pay | Admitting: Internal Medicine

## 2015-12-22 DIAGNOSIS — Z853 Personal history of malignant neoplasm of breast: Secondary | ICD-10-CM

## 2016-01-07 ENCOUNTER — Other Ambulatory Visit: Payer: Self-pay

## 2016-02-12 DIAGNOSIS — Z23 Encounter for immunization: Secondary | ICD-10-CM | POA: Diagnosis not present

## 2016-02-16 ENCOUNTER — Ambulatory Visit
Admission: RE | Admit: 2016-02-16 | Discharge: 2016-02-16 | Disposition: A | Discharge status: Home / Self Care | Payer: Medicare Other | Source: Ambulatory Visit | Attending: Internal Medicine | Admitting: Internal Medicine

## 2016-02-16 DIAGNOSIS — Z853 Personal history of malignant neoplasm of breast: Secondary | ICD-10-CM

## 2016-02-16 DIAGNOSIS — R928 Other abnormal and inconclusive findings on diagnostic imaging of breast: Secondary | ICD-10-CM | POA: Diagnosis not present

## 2016-04-19 ENCOUNTER — Other Ambulatory Visit: Payer: Self-pay | Admitting: Internal Medicine

## 2016-04-19 MED ORDER — AMLODIPINE BESYLATE 10 MG PO TABS: 10.0000 mg | ORAL_TABLET | Freq: Every day | ORAL | 1 refills | Status: DC

## 2016-05-10 DIAGNOSIS — H25813 Combined forms of age-related cataract, bilateral: Secondary | ICD-10-CM | POA: Diagnosis not present

## 2016-05-10 DIAGNOSIS — E119 Type 2 diabetes mellitus without complications: Secondary | ICD-10-CM | POA: Diagnosis not present

## 2016-05-10 DIAGNOSIS — H524 Presbyopia: Secondary | ICD-10-CM | POA: Diagnosis not present

## 2016-05-10 DIAGNOSIS — E089 Diabetes mellitus due to underlying condition without complications: Secondary | ICD-10-CM | POA: Diagnosis not present

## 2016-05-22 ENCOUNTER — Other Ambulatory Visit: Payer: Self-pay | Admitting: Internal Medicine

## 2016-05-22 MED ORDER — SIMVASTATIN 10 MG PO TABS: 10.0000 mg | ORAL_TABLET | Freq: Every day | ORAL | 3 refills | Status: DC

## 2016-05-22 MED ORDER — POTASSIUM CHLORIDE CRYS ER 20 MEQ PO TBCR: 20.0000 meq | EXTENDED_RELEASE_TABLET | Freq: Two times a day (BID) | ORAL | 2 refills | Status: DC

## 2016-05-22 MED ORDER — FUROSEMIDE 40 MG PO TABS: 40.0000 mg | ORAL_TABLET | Freq: Every day | ORAL | 2 refills | Status: DC

## 2016-05-22 NOTE — Telephone Encounter (Signed)
Refill request from [facility].

## 2016-06-05 ENCOUNTER — Other Ambulatory Visit: Payer: Self-pay

## 2016-06-05 MED ORDER — NEBIVOLOL HCL 20 MG PO TABS: 20.0000 mg | ORAL_TABLET | Freq: Every day | ORAL | 11 refills | Status: DC

## 2016-06-13 ENCOUNTER — Ambulatory Visit (INDEPENDENT_AMBULATORY_CARE_PROVIDER_SITE_OTHER): Payer: Medicare Other | Admitting: Internal Medicine

## 2016-06-13 ENCOUNTER — Encounter: Payer: Self-pay | Admitting: Internal Medicine

## 2016-06-13 VITALS — BP 150/80 | HR 98 | Resp 12

## 2016-06-13 DIAGNOSIS — E118 Type 2 diabetes mellitus with unspecified complications: Secondary | ICD-10-CM | POA: Diagnosis not present

## 2016-06-13 DIAGNOSIS — I10 Essential (primary) hypertension: Secondary | ICD-10-CM

## 2016-06-13 DIAGNOSIS — E039 Hypothyroidism, unspecified: Secondary | ICD-10-CM

## 2016-06-13 DIAGNOSIS — E785 Hyperlipidemia, unspecified: Secondary | ICD-10-CM | POA: Diagnosis not present

## 2016-06-13 LAB — BASIC METABOLIC PANEL
BUN: 15 mg/dL (ref 7–25)
CALCIUM: 9.9 mg/dL (ref 8.6–10.4)
CO2: 29 mmol/L (ref 20–31)
Chloride: 102 mmol/L (ref 98–110)
Creat: 0.82 mg/dL (ref 0.60–0.93)
Glucose, Bld: 96 mg/dL (ref 65–99)
Potassium: 4.6 mmol/L (ref 3.5–5.3)
Sodium: 140 mmol/L (ref 135–146)

## 2016-06-13 LAB — TSH: TSH: 2.76 m[IU]/L

## 2016-06-13 MED ORDER — LEVOTHYROXINE SODIUM 50 MCG PO TABS: 50.0000 ug | ORAL_TABLET | Freq: Every day | ORAL | 1 refills | Status: DC

## 2016-06-13 MED ORDER — NEBIVOLOL HCL 20 MG PO TABS: 20.0000 mg | ORAL_TABLET | Freq: Every day | ORAL | Status: DC

## 2016-06-13 MED ORDER — FUROSEMIDE 40 MG PO TABS: 40.0000 mg | ORAL_TABLET | Freq: Every day | ORAL | 2 refills | Status: DC

## 2016-06-13 MED ORDER — SIMVASTATIN 10 MG PO TABS: 10.0000 mg | ORAL_TABLET | Freq: Every day | ORAL | 3 refills | Status: DC

## 2016-06-13 MED ORDER — GLUCOSE BLOOD VI STRP: ORAL_STRIP | 12 refills | Status: AC

## 2016-06-13 MED ORDER — NEBIVOLOL HCL 20 MG PO TABS: 20.0000 mg | ORAL_TABLET | Freq: Every day | ORAL | 1 refills | Status: DC

## 2016-06-13 MED ORDER — AMLODIPINE BESYLATE 10 MG PO TABS: 10.0000 mg | ORAL_TABLET | Freq: Every day | ORAL | 1 refills | Status: DC

## 2016-06-13 NOTE — Progress Notes (Signed)
   Subjective:    Patient ID: Victoria Hess, female    DOB: [birth year], 71 y.o.   MRN: [id number]  HPI  Victoria Hess is in today for follow-up on hypertension and hypothyroidism and hyperlipidemia. She is not fasting today. She also has controlled type 2 diabetes mellitus. TSH and hemoglobin A1c drawn today. Her blood pressure was elevated on arrival at 160/88.  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 She's thinking about retiring in the near future. Her husband has retired and she would like to travel with him.  Has hypertension that requires multiple medications to control. Currently, she is on furosemide 40 mg daily, amlodipine 10 mg daily, Bystolic 20 mg daily. It's possible if she retires her blood pressure will improve. We could consider adding Cardura for better control if necessary. She will keep an eye on her blood pressure.  She agrees to return in June for physical examination. She does take potassium supplement. She is on Zocor 10 mg daily. At last check her lipid panel was normal on this medication. She does check her glucose twice daily. Diabetes is control with metformin.    Review of Systems     Objective:   Physical Exam Skin warm and dry. Nodes none. Neck is supple without JVD thyromegaly or carotid bruits. Chest clear. Cardiac exam regular rate and rhythm. Extremities without edema.       Assessment & Plan:  Controlled type 2 diabetes mellitus treated with metformin-hemoglobin A1c pending  Hypothyroidism-TSH checked on thyroid replacement  Hypertension-would like to see control better. She will monitor it and we will consider perhaps adding additional medication in June when she returns for physical exam. School will be added at that time and we can reassess her blood pressure when she is not working.  Hyperlipidemia-stable on lipid-lowering medication  Plan: Continue same medications and return in June for physical exam.  Watch blood pressure at home. Lab work pending.

## 2016-06-13 NOTE — Patient Instructions (Signed)
It was a pleasure to see you today. Lab work drawn and pending. Return in June for physical exam. Continue same medications.

## 2016-06-14 LAB — HEMOGLOBIN A1C
HEMOGLOBIN A1C: 5.7 % — AB (ref <5.7)
MEAN PLASMA GLUCOSE: 117 mg/dL

## 2016-06-20 ENCOUNTER — Ambulatory Visit: Payer: Managed Care, Other (non HMO) | Admitting: Podiatry

## 2016-06-20 ENCOUNTER — Ambulatory Visit: Payer: Managed Care, Other (non HMO) | Admitting: Sports Medicine

## 2016-06-27 ENCOUNTER — Ambulatory Visit: Payer: Medicare Other | Admitting: Internal Medicine

## 2016-08-29 ENCOUNTER — Other Ambulatory Visit: Payer: Self-pay

## 2016-08-29 MED ORDER — SIMVASTATIN 10 MG PO TABS: 10.0000 mg | ORAL_TABLET | Freq: Every day | ORAL | 3 refills | Status: DC

## 2016-08-29 MED ORDER — LEVOTHYROXINE SODIUM 50 MCG PO TABS: 50.0000 ug | ORAL_TABLET | Freq: Every day | ORAL | 3 refills | Status: DC

## 2016-08-29 MED ORDER — POTASSIUM CHLORIDE CRYS ER 20 MEQ PO TBCR: 20.0000 meq | EXTENDED_RELEASE_TABLET | Freq: Two times a day (BID) | ORAL | 3 refills | Status: DC

## 2016-08-29 MED ORDER — NEBIVOLOL HCL 20 MG PO TABS: 20.0000 mg | ORAL_TABLET | Freq: Every day | ORAL | 3 refills | Status: DC

## 2016-08-29 MED ORDER — AMLODIPINE BESYLATE 10 MG PO TABS
10.0000 mg | ORAL_TABLET | Freq: Every day | ORAL | 3 refills | Status: DC
Start: 2016-08-29 — End: 2017-07-02

## 2016-08-31 ENCOUNTER — Other Ambulatory Visit: Payer: Self-pay

## 2016-08-31 MED ORDER — METFORMIN HCL 500 MG PO TABS: 500.0000 mg | ORAL_TABLET | Freq: Two times a day (BID) | ORAL | 11 refills | Status: DC

## 2016-08-31 MED ORDER — FUROSEMIDE 40 MG PO TABS: 40.0000 mg | ORAL_TABLET | Freq: Every day | ORAL | 3 refills | Status: DC

## 2016-10-20 ENCOUNTER — Other Ambulatory Visit: Payer: Medicare Other | Admitting: Internal Medicine

## 2016-10-23 ENCOUNTER — Encounter: Payer: Medicare Other | Admitting: Internal Medicine

## 2016-10-31 ENCOUNTER — Other Ambulatory Visit: Payer: Medicare Other | Admitting: Internal Medicine

## 2016-11-02 ENCOUNTER — Encounter: Payer: Medicare Other | Admitting: Internal Medicine

## 2016-11-10 ENCOUNTER — Other Ambulatory Visit: Payer: Medicare Other | Admitting: Internal Medicine

## 2016-11-10 DIAGNOSIS — I10 Essential (primary) hypertension: Secondary | ICD-10-CM

## 2016-11-10 DIAGNOSIS — E785 Hyperlipidemia, unspecified: Secondary | ICD-10-CM

## 2016-11-10 DIAGNOSIS — E039 Hypothyroidism, unspecified: Secondary | ICD-10-CM | POA: Diagnosis not present

## 2016-11-10 DIAGNOSIS — E1169 Type 2 diabetes mellitus with other specified complication: Secondary | ICD-10-CM

## 2016-11-10 DIAGNOSIS — E669 Obesity, unspecified: Secondary | ICD-10-CM

## 2016-11-10 DIAGNOSIS — Z0000 Encounter for general adult medical examination without abnormal findings: Secondary | ICD-10-CM | POA: Diagnosis not present

## 2016-11-10 LAB — COMPLETE METABOLIC PANEL WITH GFR
ALT: 8 U/L (ref 6–29)
AST: 15 U/L (ref 10–35)
Albumin: 4.2 g/dL (ref 3.6–5.1)
Alkaline Phosphatase: 90 U/L (ref 33–130)
BUN: 19 mg/dL (ref 7–25)
CALCIUM: 9.6 mg/dL (ref 8.6–10.4)
CHLORIDE: 103 mmol/L (ref 98–110)
CO2: 24 mmol/L (ref 20–31)
CREATININE: 0.87 mg/dL (ref 0.60–0.93)
GFR, Est African American: 78 mL/min (ref >=60)
GFR, Est Non African American: 68 mL/min (ref >=60)
Glucose, Bld: 84 mg/dL (ref 65–99)
Potassium: 4.7 mmol/L (ref 3.5–5.3)
Sodium: 140 mmol/L (ref 135–146)
Total Bilirubin: 0.3 mg/dL (ref 0.2–1.2)
Total Protein: 6.8 g/dL (ref 6.1–8.1)

## 2016-11-10 LAB — CBC WITH DIFFERENTIAL/PLATELET
BASOS PCT: 1 %
Basophils Absolute: 65 cells/uL (ref 0–200)
Eosinophils Absolute: 130 cells/uL (ref 15–500)
Eosinophils Relative: 2 %
HEMATOCRIT: 36.3 % (ref 35.0–45.0)
Hemoglobin: 11.6 g/dL — ABNORMAL LOW (ref 11.7–15.5)
LYMPHS ABS: 1300 {cells}/uL (ref 850–3900)
LYMPHS PCT: 20 %
MCH: 27.4 pg (ref 27.0–33.0)
MCHC: 32 g/dL (ref 32.0–36.0)
MCV: 85.6 fL (ref 80.0–100.0)
MONO ABS: 585 {cells}/uL (ref 200–950)
MPV: 11.4 fL (ref 7.5–12.5)
Monocytes Relative: 9 %
NEUTROS ABS: 4420 {cells}/uL (ref 1500–7800)
Neutrophils Relative %: 68 %
PLATELETS: 266 10*3/uL (ref 140–400)
RBC: 4.24 MIL/uL (ref 3.80–5.10)
RDW: 15.8 % — AB (ref 11.0–15.0)
WBC: 6.5 10*3/uL (ref 3.8–10.8)

## 2016-11-10 LAB — TSH: TSH: 2.68 mIU/L

## 2016-11-10 LAB — LIPID PANEL
CHOL/HDL RATIO: 1.7 ratio (ref <5.0)
Cholesterol: 185 mg/dL (ref <200)
HDL: 109 mg/dL (ref >50)
LDL CALC: 67 mg/dL (ref <100)
Triglycerides: 44 mg/dL (ref <150)
VLDL: 9 mg/dL (ref <30)

## 2016-11-11 LAB — MICROALBUMIN / CREATININE URINE RATIO
CREATININE, URINE: 141 mg/dL (ref 20–320)
Microalb Creat Ratio: 4 mcg/mg creat (ref <30)
Microalb, Ur: 0.6 mg/dL

## 2016-11-11 LAB — HEMOGLOBIN A1C
Hgb A1c MFr Bld: 5.7 % — ABNORMAL HIGH (ref <5.7)
Mean Plasma Glucose: 117 mg/dL

## 2016-11-13 ENCOUNTER — Encounter: Payer: Self-pay | Admitting: Internal Medicine

## 2016-11-13 ENCOUNTER — Ambulatory Visit (INDEPENDENT_AMBULATORY_CARE_PROVIDER_SITE_OTHER): Payer: Medicare Other | Admitting: Internal Medicine

## 2016-11-13 VITALS — BP 130/80 | HR 55 | Temp 98.2°F | Ht 64.0 in | Wt 172.0 lb

## 2016-11-13 DIAGNOSIS — Z1211 Encounter for screening for malignant neoplasm of colon: Secondary | ICD-10-CM | POA: Diagnosis not present

## 2016-11-13 DIAGNOSIS — E8881 Metabolic syndrome: Secondary | ICD-10-CM | POA: Diagnosis not present

## 2016-11-13 DIAGNOSIS — E785 Hyperlipidemia, unspecified: Secondary | ICD-10-CM | POA: Diagnosis not present

## 2016-11-13 DIAGNOSIS — E119 Type 2 diabetes mellitus without complications: Secondary | ICD-10-CM

## 2016-11-13 DIAGNOSIS — Z0000 Encounter for general adult medical examination without abnormal findings: Secondary | ICD-10-CM

## 2016-11-13 DIAGNOSIS — E039 Hypothyroidism, unspecified: Secondary | ICD-10-CM | POA: Diagnosis not present

## 2016-11-13 DIAGNOSIS — Z853 Personal history of malignant neoplasm of breast: Secondary | ICD-10-CM | POA: Diagnosis not present

## 2016-11-13 DIAGNOSIS — I10 Essential (primary) hypertension: Secondary | ICD-10-CM

## 2016-11-13 DIAGNOSIS — Z6829 Body mass index (BMI) 29.0-29.9, adult: Secondary | ICD-10-CM

## 2016-11-13 LAB — POCT URINALYSIS DIPSTICK
Bilirubin, UA: NEGATIVE
Blood, UA: NEGATIVE
Glucose, UA: NEGATIVE
KETONES UA: NEGATIVE
Leukocytes, UA: NEGATIVE
Nitrite, UA: NEGATIVE
PH UA: 7.5 (ref 5.0–8.0)
PROTEIN UA: NEGATIVE
SPEC GRAV UA: 1.015 (ref 1.010–1.025)
UROBILINOGEN UA: 0.2 U/dL

## 2016-11-13 NOTE — Progress Notes (Signed)
Subjective:    Patient ID: Victoria Hess, female    DOB: [birth year], 71 y.o.   MRN: [id number]  HPI 71 year old Female for health maintenance exam,Medicare wellness, and evaluation of medical issues.  Pt had Colonoscopy 08/06/2006 at  [facility] by Dr. Beckey Downing  which was normal with 10 year follow up recommended. Wants to have endoscopy here in [place]. Referral will be made.  She has a history of hypertension, hypothyroidism, controlled type 2 diabetes mellitus, obesity, hyperlipidemia, metabolic syndrome and history of breast cancer followed by Dr. Jana Hakim.  History of bilateral ductal carcinoma in situ. She had a wide excision procedure of the left breast in 2011 and the lesser procedure done on the right breast in 2002.  History of tonsillectomy and adenoidectomy 1955. Appendectomy and tubal ligation 1977. History of uterine fibroids, cystocele and uterine prolapse.  Social history: She may be retiring this year. 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 Lives in [place]. She is married. Husband in good health. She does not smoke or consume alcohol.  Weight in 2017 was 167.5 pounds and is now 172 pounds. Needs to get more exercise. BMI is 29.52.  Family history: Father died at age 17 of a stroke. Mother died from coronary disease. One brother with history of hypertension and diabetes. One sister with hypertension and diabetes. Another sister with hypertension. One sister in good health without medical issues.   Review of Systems  Constitutional: Negative.   All other systems reviewed and are negative.      Objective:   Physical Exam  Constitutional: She is oriented to person, place, and time. She appears well-developed and well-nourished. No distress.  HENT:  Head: Normocephalic and atraumatic.  Right Ear: External ear normal.  Left Ear: External ear normal.  Mouth/Throat:  Oropharynx is clear and moist.  Eyes: Pupils are equal, round, and reactive to light. Conjunctivae are normal. Right eye exhibits no discharge. Left eye exhibits no discharge.  Neck: Neck supple. No JVD present. No thyromegaly present.  Cardiovascular: Normal rate, regular rhythm, normal heart sounds and intact distal pulses.   No murmur heard. Pulmonary/Chest: Effort normal and breath sounds normal. No respiratory distress. She has no wheezes. She has no rales.  Breasts normal female  Abdominal: Soft. Bowel sounds are normal. She exhibits no distension and no mass. There is no tenderness. There is no rebound and no guarding.  Genitourinary:  Genitourinary Comments: Pap done in 2013 and does not need to be repeated due to age recommendations. Bimanual normal.  Musculoskeletal: She exhibits no edema.  Lymphadenopathy:    She has no cervical adenopathy.  Neurological: She is alert and oriented to person, place, and time. She has normal reflexes. No cranial nerve deficit.  Skin: Skin is warm and dry. No rash noted. She is not diaphoretic.  Psychiatric: She has a normal mood and affect. Her behavior is normal. Judgment and thought content normal.  Vitals reviewed.         Assessment & Plan:  History of bilateral ductal carcinoma in situ  Controlled type 2 diabetes mellitus  Hyperlipidemia  Essential hypertension  Obesity-continue diet and exercise efforts  Metabolic syndrome  Hypothyroidism  Plan: Continue same medications and return in 6 months. Encouraged diet exercise and weight loss.  Subjective:   Patient presents for Medicare Annual/Subsequent preventive examination.  Review Past Medical/Family/Social:See above   Risk Factors  Current exercise habits: Some exercise  but could probably get more Dietary issues discussed: Low fat low carbohydrate  Cardiac risk factors:Family history, diabetes mellitus, hyperlipidemia, hypertension  Depression Screen  (Note: if  answer to either of the following is "Yes", a more complete depression screening is indicated)   Over the past two weeks, have you felt down, depressed or hopeless? No  Over the past two weeks, have you felt little interest or pleasure in doing things? No Have you lost interest or pleasure in daily life? No Do you often feel hopeless? No Do you cry easily over simple problems? No   Activities of Daily Living  In your present state of health, do you have any difficulty performing the following activities?:   Driving? No  Managing money? No  Feeding yourself? No  Getting from bed to chair? No  Climbing a flight of stairs? No  Preparing food and eating?: No  Bathing or showering? No  Getting dressed: No  Getting to the toilet? No  Using the toilet:No  Moving around from place to place: No  In the past year have you fallen or had a near fall?:No  Are you sexually active? No  Do you have more than one partner? No   Hearing Difficulties: No  Do you often ask people to speak up or repeat themselves? No  Do you experience ringing or noises in your ears? No  Do you have difficulty understanding soft or whispered voices? No  Do you feel that you have a problem with memory? No Do you often misplace items? No    Home Safety:  Do you have a smoke alarm at your residence? Yes Do you have grab bars in the bathroom?No Do you have throw rugs in your house? Yes   Cognitive Testing  Alert? Yes Normal Appearance?Yes  Oriented to person? Yes Place? Yes  Time? Yes  Recall of three objects? Yes  Can perform simple calculations? Yes  Displays appropriate judgment?Yes  Can read the correct time from a watch face?Yes   List the Names of Other Physician/Practitioners you currently use:  See referral list for the physicians patient is currently seeing.     Review of Systems: See above   Objective:     General appearance: Appears stated age and mildly obese  Head: Normocephalic,  without obvious abnormality, atraumatic  Eyes: conj clear, EOMi PEERLA  Ears: normal TM's and external ear canals both ears  Nose: Nares normal. Septum midline. Mucosa normal. No drainage or sinus tenderness.  Throat: lips, mucosa, and tongue normal; teeth and gums normal  Neck: no adenopathy, no carotid bruit, no JVD, supple, symmetrical, trachea midline and thyroid not enlarged, symmetric, no tenderness/mass/nodules  No CVA tenderness.  Lungs: clear to auscultation bilaterally  Breasts: normal appearance, no masses or tenderness Heart: regular rate and rhythm, S1, S2 normal, no murmur, click, rub or gallop  Abdomen: soft, non-tender; bowel sounds normal; no masses, no organomegaly  Musculoskeletal: ROM normal in all joints, no crepitus, no deformity, Normal muscle strengthen. Back  is symmetric, no curvature. Skin: Skin color, texture, turgor normal. No rashes or lesions  Lymph nodes: Cervical, supraclavicular, and axillary nodes normal.  Neurologic: CN 2 -12 Normal, Normal symmetric reflexes. Normal coordination and gait  Psych: Alert & Oriented x 3, Mood appear stable.    Assessment:    Annual wellness medicare exam   Plan:    During the course of the visit the patient was educated and counseled about appropriate screening and preventive services including:  Annual flu vaccine  Annual diabetic eye exam  To have colonoscopy in the near future     Patient Instructions (the written plan) was given to the patient.  Medicare Attestation  I have personally reviewed:  The patient's medical and social history  Their use of alcohol, tobacco or illicit drugs  Their current medications and supplements  The patient's functional ability including ADLs,fall risks, home safety risks, cognitive, and hearing and visual impairment  Diet and physical activities  Evidence for depression or mood disorders  The patient's weight, height, BMI, and visual acuity have been recorded in the  chart. I have made referrals, counseling, and provided education to the patient based on review of the above and I have provided the patient with a written personalized care plan for preventive services.

## 2016-11-13 NOTE — Patient Instructions (Addendum)
It was a pleaseure to see you today. Continue same meds and RTC in 6 months. Work on diet exercise and weight loss.

## 2016-11-24 ENCOUNTER — Encounter: Payer: Self-pay | Admitting: Gastroenterology

## 2017-01-04 ENCOUNTER — Other Ambulatory Visit: Payer: Self-pay | Admitting: Internal Medicine

## 2017-01-04 DIAGNOSIS — Z853 Personal history of malignant neoplasm of breast: Secondary | ICD-10-CM

## 2017-01-17 ENCOUNTER — Ambulatory Visit (AMBULATORY_SURGERY_CENTER): Payer: Self-pay | Admitting: *Deleted

## 2017-01-17 VITALS — Ht 64.0 in | Wt 169.0 lb

## 2017-01-17 DIAGNOSIS — D0510 Intraductal carcinoma in situ of unspecified breast: Secondary | ICD-10-CM | POA: Insufficient documentation

## 2017-01-17 DIAGNOSIS — Z1211 Encounter for screening for malignant neoplasm of colon: Secondary | ICD-10-CM

## 2017-01-17 MED ORDER — NA SULFATE-K SULFATE-MG SULF 17.5-3.13-1.6 GM/177ML PO SOLN: ORAL | 0 refills | Status: DC

## 2017-01-17 NOTE — Progress Notes (Signed)
Patient denies any allergies to eggs or soy. Patient denies any problems with anesthesia/sedation. Patient denies any oxygen use at home and does not take any diet/weight loss medications. Unable to use computer per pt.

## 2017-01-30 ENCOUNTER — Encounter: Payer: Self-pay | Admitting: Gastroenterology

## 2017-01-30 ENCOUNTER — Ambulatory Visit (AMBULATORY_SURGERY_CENTER): Payer: Medicare Other | Admitting: Gastroenterology

## 2017-01-30 VITALS — BP 142/52 | HR 57 | Temp 97.5°F | Resp 11 | Ht 64.0 in | Wt 169.0 lb

## 2017-01-30 DIAGNOSIS — Z1212 Encounter for screening for malignant neoplasm of rectum: Secondary | ICD-10-CM

## 2017-01-30 DIAGNOSIS — Z1211 Encounter for screening for malignant neoplasm of colon: Secondary | ICD-10-CM | POA: Diagnosis not present

## 2017-01-30 MED ORDER — SODIUM CHLORIDE 0.9 % IV SOLN: 500.0000 mL | INTRAVENOUS | Status: DC

## 2017-01-30 NOTE — Patient Instructions (Signed)
YOU HAD AN ENDOSCOPIC PROCEDURE TODAY AT THE [facility]:   Refer to the procedure report that was given to you for any specific questions about what was found during the examination.  If the procedure report does not answer your questions, please call your gastroenterologist to clarify.  If you requested that your care partner not be given the details of your procedure findings, then the procedure report has been included in a sealed envelope for you to review at your convenience later.  YOU SHOULD EXPECT: Some feelings of bloating in the abdomen. Passage of more gas than usual.  Walking can help get rid of the air that was put into your GI tract during the procedure and reduce the bloating. If you had a lower endoscopy (such as a colonoscopy or flexible sigmoidoscopy) you may notice spotting of blood in your stool or on the toilet paper. If you underwent a bowel prep for your procedure, you may not have a normal bowel movement for a few days.  Please Note:  You might notice some irritation and congestion in your nose or some drainage.  This is from the oxygen used during your procedure.  There is no need for concern and it should clear up in a day or so.  SYMPTOMS TO REPORT IMMEDIATELY:   Following lower endoscopy (colonoscopy or flexible sigmoidoscopy):  Excessive amounts of blood in the stool  Significant tenderness or worsening of abdominal pains  Swelling of the abdomen that is new, acute  Fever of 100F or higher   For urgent or emergent issues, a gastroenterologist can be reached at any hour by calling [phone].   DIET:  We do recommend a small meal at first, but then you may proceed to your regular diet.  Drink plenty of fluids but you should avoid alcoholic beverages for 24 hours.  ACTIVITY:  You should plan to take it easy for the rest of today and you should NOT DRIVE or use heavy machinery until tomorrow (because of the sedation medicines used during the test).     FOLLOW UP: Our staff will call the number listed on your records the next business day following your procedure to check on you and address any questions or concerns that you may have regarding the information given to you following your procedure. If we do not reach you, we will leave a message.  However, if you are feeling well and you are not experiencing any problems, there is no need to return our call.  We will assume that you have returned to your regular daily activities without incident.  If any biopsies were taken you will be contacted by phone or by letter within the next 1-3 weeks.  Please call us at [phone] if you have not heard about the biopsies in 3 weeks.    SIGNATURES/CONFIDENTIALITY: You and/or your care partner have signed paperwork which will be entered into your electronic medical record.  These signatures attest to the fact that that the information above on your After Visit Summary has been reviewed and is understood.  Full responsibility of the confidentiality of this discharge information lies with you and/or your care-partner.  Thank you for letting us take care of your healthcare needs today. 

## 2017-01-30 NOTE — Progress Notes (Signed)
Spontaneous respirations throughout. VSS. Resting comfortably. To PACU on room air. Report to  RN. 

## 2017-01-30 NOTE — Progress Notes (Addendum)
Patient's blood sugar 71. Patient stating she did not take any diabetic medication today. This am, blood sugar-88, at home. Patient denies any symptoms.rechecked bllod sugar at 1057, 73. Patient still asymptomatic. Informed Dr. Gilford Raid, CRNA and Judson Roch Monday, RN(recovery)500 ml d5w placed under the stretcher if needed.

## 2017-01-30 NOTE — Op Note (Signed)
[place] Patient Name: Victoria Hess Procedure Date: 01/30/2017 11:10 AM MRN: [id number] Endoscopist: Mallie Mussel L. Loletha Carrow , MD Age: 71 Referring MD:  Date of Birth: May 30, 1945 Gender: Female Account #: [id number] Procedure:                Colonoscopy Indications:              Screening for colorectal malignant neoplasm (no                            polyps on 2008 colonoscopy at outside institution) Medicines:                Monitored Anesthesia Care Procedure:                Pre-Anesthesia Assessment:                           - Prior to the procedure, a History and Physical                            was performed, and patient medications and                            allergies were reviewed. The patient's tolerance of                            previous anesthesia was also reviewed. The risks                            and benefits of the procedure and the sedation                            options and risks were discussed with the patient.                            All questions were answered, and informed consent                            was obtained. Anticoagulants: The patient has taken                            aspirin. It was decided not to withhold this                            medication prior to the procedure. ASA Grade                            Assessment: II - A patient with mild systemic                            disease. After reviewing the risks and benefits,                            the patient was deemed in satisfactory condition to  undergo the procedure.                           After obtaining informed consent, the colonoscope                            was passed under direct vision. Throughout the                            procedure, the patient's blood pressure, pulse, and                            oxygen saturations were monitored continuously. The                            Model CF-HQ190L [id number]) scope  was introduced                            through the anus and advanced to the the cecum,                            identified by appendiceal orifice and ileocecal                            valve. The colonoscopy was performed without                            difficulty. The patient tolerated the procedure                            well. The quality of the bowel preparation was                            excellent. The ileocecal valve, appendiceal                            orifice, and rectum were photographed. The quality                            of the bowel preparation was evaluated using the                            BBPS [facility] Bowel Preparation Scale) with scores                            of: Right Colon = 3, Transverse Colon = 3 and Left                            Colon = 3 (entire mucosa seen well with no residual                            staining, small fragments of stool or opaque  liquid). The total BBPS score equals 9. The bowel                            preparation used was SUPREP. Scope In: 11:19:48 AM Scope Out: 11:33:50 AM Scope Withdrawal Time: 0 hours 9 minutes 8 seconds  Total Procedure Duration: 0 hours 14 minutes 2 seconds  Findings:                 The digital rectal exam findings include decreased                            sphincter tone.                           A few medium-mouthed diverticula were found in the                            left colon and right colon.                           The exam was otherwise without abnormality on                            direct and retroflexion views. Complications:            No immediate complications. Estimated Blood Loss:     Estimated blood loss: none. Impression:               - Decreased sphincter tone found on digital rectal                            exam.                           - Diverticulosis in the left colon and in the right                            colon.                            - The examination was otherwise normal on direct                            and retroflexion views.                           - No specimens collected. Recommendation:           - Patient has a contact number available for                            emergencies. The signs and symptoms of potential                            delayed complications were discussed with the  patient. Return to normal activities tomorrow.                            Written discharge instructions were provided to the                            patient.                           - Resume previous diet.                           - Continue present medications.                           - Repeat colonoscopy in 10 years for screening                            purposes. Elliot Simoneaux L. Loletha Carrow, MD 01/30/2017 11:38:04 AM This report has been signed electronically.

## 2017-01-31 ENCOUNTER — Telehealth: Payer: Self-pay | Admitting: *Deleted

## 2017-01-31 NOTE — Telephone Encounter (Signed)
  Follow up Call-  Call back number 01/30/2017  Post procedure Call Back phone  # [phone]  Permission to leave phone message Yes  Some recent data might be hidden     Patient questions:  Do you have a fever, pain , or abdominal swelling? No. Pain Score  0 *  Have you tolerated food without any problems? Yes.    Have you been able to return to your normal activities? Yes.    Do you have any questions about your discharge instructions: Diet   No. Medications  No. Follow up visit  No.  Do you have questions or concerns about your Care? No.  Actions: * If pain score is 4 or above: No action needed, pain <4.

## 2017-02-13 DIAGNOSIS — Z23 Encounter for immunization: Secondary | ICD-10-CM | POA: Diagnosis not present

## 2017-02-19 ENCOUNTER — Ambulatory Visit
Admission: RE | Admit: 2017-02-19 | Discharge: 2017-02-19 | Disposition: A | Discharge status: Home / Self Care | Payer: Medicare Other | Source: Ambulatory Visit | Attending: Internal Medicine | Admitting: Internal Medicine

## 2017-02-19 DIAGNOSIS — R928 Other abnormal and inconclusive findings on diagnostic imaging of breast: Secondary | ICD-10-CM | POA: Diagnosis not present

## 2017-02-19 DIAGNOSIS — Z853 Personal history of malignant neoplasm of breast: Secondary | ICD-10-CM

## 2017-02-19 HISTORY — DX: Personal history of irradiation: Z92.3

## 2017-05-09 ENCOUNTER — Other Ambulatory Visit: Payer: Self-pay | Admitting: Internal Medicine

## 2017-05-09 DIAGNOSIS — E669 Obesity, unspecified: Secondary | ICD-10-CM

## 2017-05-09 DIAGNOSIS — Z79899 Other long term (current) drug therapy: Secondary | ICD-10-CM

## 2017-05-09 DIAGNOSIS — E785 Hyperlipidemia, unspecified: Secondary | ICD-10-CM

## 2017-05-09 DIAGNOSIS — E1169 Type 2 diabetes mellitus with other specified complication: Secondary | ICD-10-CM

## 2017-05-09 DIAGNOSIS — I10 Essential (primary) hypertension: Secondary | ICD-10-CM

## 2017-05-09 DIAGNOSIS — E039 Hypothyroidism, unspecified: Secondary | ICD-10-CM

## 2017-05-22 ENCOUNTER — Other Ambulatory Visit: Payer: Medicare Other | Admitting: Internal Medicine

## 2017-05-22 DIAGNOSIS — I10 Essential (primary) hypertension: Secondary | ICD-10-CM

## 2017-05-22 DIAGNOSIS — E039 Hypothyroidism, unspecified: Secondary | ICD-10-CM

## 2017-05-22 DIAGNOSIS — Z79899 Other long term (current) drug therapy: Secondary | ICD-10-CM | POA: Diagnosis not present

## 2017-05-22 DIAGNOSIS — E1169 Type 2 diabetes mellitus with other specified complication: Secondary | ICD-10-CM

## 2017-05-22 DIAGNOSIS — E669 Obesity, unspecified: Secondary | ICD-10-CM | POA: Diagnosis not present

## 2017-05-22 DIAGNOSIS — E785 Hyperlipidemia, unspecified: Secondary | ICD-10-CM

## 2017-05-23 LAB — HEPATIC FUNCTION PANEL
AG RATIO: 1.6 (calc) (ref 1.0–2.5)
ALKALINE PHOSPHATASE (APISO): 85 U/L (ref 33–130)
ALT: 9 U/L (ref 6–29)
AST: 13 U/L (ref 10–35)
Albumin: 4.3 g/dL (ref 3.6–5.1)
BILIRUBIN DIRECT: 0.1 mg/dL (ref 0.0–0.2)
BILIRUBIN INDIRECT: 0.2 mg/dL (ref 0.2–1.2)
Globulin: 2.7 g/dL (calc) (ref 1.9–3.7)
TOTAL PROTEIN: 7 g/dL (ref 6.1–8.1)
Total Bilirubin: 0.3 mg/dL (ref 0.2–1.2)

## 2017-05-23 LAB — MICROALBUMIN / CREATININE URINE RATIO
Creatinine, Urine: 50 mg/dL (ref 20–275)
MICROALB UR: 0.2 mg/dL
MICROALB/CREAT RATIO: 4 ug/mg{creat} (ref <30)

## 2017-05-23 LAB — TSH: TSH: 1.68 mIU/L (ref 0.40–4.50)

## 2017-05-23 LAB — LIPID PANEL
Cholesterol: 179 mg/dL (ref <200)
HDL: 100 mg/dL (ref >50)
LDL CHOLESTEROL (CALC): 64 mg/dL
NON-HDL CHOLESTEROL (CALC): 79 mg/dL (ref <130)
TRIGLYCERIDES: 73 mg/dL (ref <150)
Total CHOL/HDL Ratio: 1.8 (calc) (ref <5.0)

## 2017-05-23 LAB — HEMOGLOBIN A1C
EAG (MMOL/L): 6.8 (calc)
HEMOGLOBIN A1C: 5.9 %{Hb} — AB (ref <5.7)
Mean Plasma Glucose: 123 (calc)

## 2017-05-24 ENCOUNTER — Ambulatory Visit: Payer: Medicare Other | Admitting: Internal Medicine

## 2017-05-25 ENCOUNTER — Encounter: Payer: Self-pay | Admitting: Internal Medicine

## 2017-05-25 ENCOUNTER — Ambulatory Visit (INDEPENDENT_AMBULATORY_CARE_PROVIDER_SITE_OTHER): Payer: Medicare Other | Admitting: Internal Medicine

## 2017-05-25 VITALS — BP 150/88 | HR 62 | Ht 64.0 in | Wt 170.0 lb

## 2017-05-25 DIAGNOSIS — Z6829 Body mass index (BMI) 29.0-29.9, adult: Secondary | ICD-10-CM | POA: Diagnosis not present

## 2017-05-25 DIAGNOSIS — E785 Hyperlipidemia, unspecified: Secondary | ICD-10-CM

## 2017-05-25 DIAGNOSIS — I10 Essential (primary) hypertension: Secondary | ICD-10-CM | POA: Diagnosis not present

## 2017-05-25 DIAGNOSIS — E119 Type 2 diabetes mellitus without complications: Secondary | ICD-10-CM

## 2017-05-25 DIAGNOSIS — Z853 Personal history of malignant neoplasm of breast: Secondary | ICD-10-CM

## 2017-05-25 DIAGNOSIS — E039 Hypothyroidism, unspecified: Secondary | ICD-10-CM

## 2017-05-25 DIAGNOSIS — E8881 Metabolic syndrome: Secondary | ICD-10-CM

## 2017-05-30 DIAGNOSIS — H25813 Combined forms of age-related cataract, bilateral: Secondary | ICD-10-CM | POA: Diagnosis not present

## 2017-05-30 DIAGNOSIS — E119 Type 2 diabetes mellitus without complications: Secondary | ICD-10-CM | POA: Diagnosis not present

## 2017-05-30 DIAGNOSIS — H524 Presbyopia: Secondary | ICD-10-CM | POA: Diagnosis not present

## 2017-05-30 DIAGNOSIS — E089 Diabetes mellitus due to underlying condition without complications: Secondary | ICD-10-CM | POA: Diagnosis not present

## 2017-06-04 ENCOUNTER — Encounter: Payer: Self-pay | Admitting: Internal Medicine

## 2017-06-04 NOTE — Patient Instructions (Signed)
It was a pleasure to see you today.  Continue to work on diet exercise and weight loss.  Continue same medications and return in 6 months for physical exam.  Please have diabetic eye exam.

## 2017-06-04 NOTE — Progress Notes (Signed)
   Subjective:    Patient ID: Victoria Hess, female    DOB: Nov 25, 1945, 72 y.o.   MRN: [id number]  HPI 72 year old white female in today for 76-month follow-up.  History of diabetes mellitus, essential hypertension, hypothyroidism, hyperlipidemia, history of bilateral intraductal carcinoma of the breast, obesity and metabolic syndrome.    Blood pressure tends to be a bit labile.  It was elevated upon arrival but sitting in the office for some 30 minutes it was 130/80.  Tends to monitor it at home and thinks is running mostly around 096 systolically.  Her weight in July 2018 was 172 pounds and is now 170 pounds.  Lipid panel and liver functions are normal.  TSH is normal on thyroid replacement.  Hemoglobin A1c 5.9% and previously 6 months ago 5.7%.  Considering the holidays just ended that is actually pretty good.  Urine for microalbumin is within normal limits.  Review of Systems     Objective:   Physical Exam Skin warm and dry.  Nodes none.  Neck is supple without JVD thyromegaly or carotid bruits.  Chest is clear to auscultation.  Cardiac exam regular rate and rhythm normal S1 and 2.  Extremities without pitting edema       Assessment & Plan:  Controlled type 2 diabetes mellitus-A1c stable at 5.9%  Hyperlipidemia-lipid panel normal  Hypothyroidism-TSH normal  Essential hypertension which can be labile at times but is basically well controlled on several drug regimen  Plan: Continue same medication regimen.  Encourage diet exercise and weight loss and return in 6 months.

## 2017-07-02 ENCOUNTER — Other Ambulatory Visit: Payer: Self-pay | Admitting: Internal Medicine

## 2017-07-16 ENCOUNTER — Other Ambulatory Visit: Payer: Self-pay | Admitting: Internal Medicine

## 2017-07-16 NOTE — Telephone Encounter (Signed)
Refill all x 6 months 

## 2017-10-10 LAB — HM DIABETES EYE EXAM

## 2017-10-11 ENCOUNTER — Encounter: Payer: Self-pay | Admitting: Internal Medicine

## 2017-11-23 ENCOUNTER — Telehealth: Payer: Self-pay

## 2017-11-23 NOTE — Telephone Encounter (Signed)
Left message to call to schedule year exam.

## 2018-01-15 ENCOUNTER — Other Ambulatory Visit: Payer: Self-pay | Admitting: Internal Medicine

## 2018-01-15 DIAGNOSIS — Z1231 Encounter for screening mammogram for malignant neoplasm of breast: Secondary | ICD-10-CM

## 2018-02-06 DIAGNOSIS — Z23 Encounter for immunization: Secondary | ICD-10-CM | POA: Diagnosis not present

## 2018-02-15 ENCOUNTER — Other Ambulatory Visit: Payer: Self-pay | Admitting: Internal Medicine

## 2018-02-15 DIAGNOSIS — Z853 Personal history of malignant neoplasm of breast: Secondary | ICD-10-CM

## 2018-02-15 DIAGNOSIS — E119 Type 2 diabetes mellitus without complications: Secondary | ICD-10-CM

## 2018-02-15 DIAGNOSIS — E785 Hyperlipidemia, unspecified: Secondary | ICD-10-CM

## 2018-02-15 DIAGNOSIS — E039 Hypothyroidism, unspecified: Secondary | ICD-10-CM

## 2018-02-15 DIAGNOSIS — E8881 Metabolic syndrome: Secondary | ICD-10-CM

## 2018-02-15 DIAGNOSIS — I10 Essential (primary) hypertension: Secondary | ICD-10-CM

## 2018-02-15 DIAGNOSIS — Z6829 Body mass index (BMI) 29.0-29.9, adult: Secondary | ICD-10-CM

## 2018-02-18 ENCOUNTER — Other Ambulatory Visit: Payer: Medicare Other | Admitting: Internal Medicine

## 2018-02-18 DIAGNOSIS — E039 Hypothyroidism, unspecified: Secondary | ICD-10-CM

## 2018-02-18 DIAGNOSIS — E8881 Metabolic syndrome: Secondary | ICD-10-CM | POA: Diagnosis not present

## 2018-02-18 DIAGNOSIS — E119 Type 2 diabetes mellitus without complications: Secondary | ICD-10-CM | POA: Diagnosis not present

## 2018-02-18 DIAGNOSIS — Z6829 Body mass index (BMI) 29.0-29.9, adult: Secondary | ICD-10-CM | POA: Diagnosis not present

## 2018-02-18 DIAGNOSIS — E785 Hyperlipidemia, unspecified: Secondary | ICD-10-CM | POA: Diagnosis not present

## 2018-02-18 DIAGNOSIS — I10 Essential (primary) hypertension: Secondary | ICD-10-CM | POA: Diagnosis not present

## 2018-02-18 DIAGNOSIS — Z853 Personal history of malignant neoplasm of breast: Secondary | ICD-10-CM | POA: Diagnosis not present

## 2018-02-19 LAB — CBC WITH DIFFERENTIAL/PLATELET
BASOS PCT: 0.6 %
Basophils Absolute: 47 cells/uL (ref 0–200)
EOS ABS: 133 {cells}/uL (ref 15–500)
Eosinophils Relative: 1.7 %
HEMATOCRIT: 31.2 % — AB (ref 35.0–45.0)
HEMOGLOBIN: 10 g/dL — AB (ref 11.7–15.5)
LYMPHS ABS: 1053 {cells}/uL (ref 850–3900)
MCH: 25.8 pg — AB (ref 27.0–33.0)
MCHC: 32.1 g/dL (ref 32.0–36.0)
MCV: 80.4 fL (ref 80.0–100.0)
MPV: 11.8 fL (ref 7.5–12.5)
Monocytes Relative: 9 %
Neutro Abs: 5866 cells/uL (ref 1500–7800)
Neutrophils Relative %: 75.2 %
Platelets: 349 10*3/uL (ref 140–400)
RBC: 3.88 10*6/uL (ref 3.80–5.10)
RDW: 14.9 % (ref 11.0–15.0)
Total Lymphocyte: 13.5 %
WBC: 7.8 10*3/uL (ref 3.8–10.8)
WBCMIX: 702 {cells}/uL (ref 200–950)

## 2018-02-19 LAB — COMPLETE METABOLIC PANEL WITH GFR
AG Ratio: 1.4 (calc) (ref 1.0–2.5)
ALBUMIN MSPROF: 4.1 g/dL (ref 3.6–5.1)
ALKALINE PHOSPHATASE (APISO): 89 U/L (ref 33–130)
ALT: 9 U/L (ref 6–29)
AST: 16 U/L (ref 10–35)
BUN: 17 mg/dL (ref 7–25)
CALCIUM: 9.7 mg/dL (ref 8.6–10.4)
CO2: 28 mmol/L (ref 20–32)
CREATININE: 0.83 mg/dL (ref 0.60–0.93)
Chloride: 102 mmol/L (ref 98–110)
GFR, EST NON AFRICAN AMERICAN: 71 mL/min/{1.73_m2} (ref >=60)
GFR, Est African American: 82 mL/min/{1.73_m2} (ref >=60)
GLOBULIN: 3 g/dL (ref 1.9–3.7)
GLUCOSE: 91 mg/dL (ref 65–99)
Potassium: 5 mmol/L (ref 3.5–5.3)
SODIUM: 139 mmol/L (ref 135–146)
Total Bilirubin: 0.3 mg/dL (ref 0.2–1.2)
Total Protein: 7.1 g/dL (ref 6.1–8.1)

## 2018-02-19 LAB — LIPID PANEL
CHOL/HDL RATIO: 2.4 (calc) (ref <5.0)
CHOLESTEROL: 155 mg/dL (ref <200)
HDL: 65 mg/dL (ref >50)
LDL CHOLESTEROL (CALC): 77 mg/dL
Non-HDL Cholesterol (Calc): 90 mg/dL (calc) (ref <130)
Triglycerides: 57 mg/dL (ref <150)

## 2018-02-19 LAB — MICROALBUMIN / CREATININE URINE RATIO
Creatinine, Urine: 96 mg/dL (ref 20–275)
MICROALB/CREAT RATIO: 4 ug/mg{creat} (ref <30)
Microalb, Ur: 0.4 mg/dL

## 2018-02-19 LAB — HEMOGLOBIN A1C
EAG (MMOL/L): 7.1 (calc)
Hgb A1c MFr Bld: 6.1 % of total Hgb — ABNORMAL HIGH (ref <5.7)
MEAN PLASMA GLUCOSE: 128 (calc)

## 2018-02-19 LAB — TSH: TSH: 1.93 m[IU]/L (ref 0.40–4.50)

## 2018-02-21 ENCOUNTER — Ambulatory Visit (INDEPENDENT_AMBULATORY_CARE_PROVIDER_SITE_OTHER): Payer: Medicare Other | Admitting: Internal Medicine

## 2018-02-21 ENCOUNTER — Encounter: Payer: Self-pay | Admitting: Internal Medicine

## 2018-02-21 VITALS — BP 140/90 | HR 64 | Ht 64.0 in | Wt 172.0 lb

## 2018-02-21 DIAGNOSIS — E785 Hyperlipidemia, unspecified: Secondary | ICD-10-CM | POA: Diagnosis not present

## 2018-02-21 DIAGNOSIS — E8881 Metabolic syndrome: Secondary | ICD-10-CM

## 2018-02-21 DIAGNOSIS — R410 Disorientation, unspecified: Secondary | ICD-10-CM | POA: Diagnosis not present

## 2018-02-21 DIAGNOSIS — I10 Essential (primary) hypertension: Secondary | ICD-10-CM | POA: Diagnosis not present

## 2018-02-21 DIAGNOSIS — Z0000 Encounter for general adult medical examination without abnormal findings: Secondary | ICD-10-CM

## 2018-02-21 DIAGNOSIS — Z853 Personal history of malignant neoplasm of breast: Secondary | ICD-10-CM | POA: Diagnosis not present

## 2018-02-21 DIAGNOSIS — E119 Type 2 diabetes mellitus without complications: Secondary | ICD-10-CM

## 2018-02-21 DIAGNOSIS — D649 Anemia, unspecified: Secondary | ICD-10-CM

## 2018-02-21 DIAGNOSIS — E039 Hypothyroidism, unspecified: Secondary | ICD-10-CM | POA: Diagnosis not present

## 2018-02-21 LAB — POCT URINALYSIS DIPSTICK
Appearance: NORMAL
BILIRUBIN UA: NEGATIVE
Blood, UA: NEGATIVE
GLUCOSE UA: NEGATIVE
KETONES UA: NEGATIVE
Leukocytes, UA: NEGATIVE
Nitrite, UA: NEGATIVE
Odor: NORMAL
PH UA: 6 (ref 5.0–8.0)
Protein, UA: NEGATIVE
Spec Grav, UA: 1.015 (ref 1.010–1.025)
UROBILINOGEN UA: 0.2 U/dL

## 2018-02-21 NOTE — Progress Notes (Signed)
Subjective:    Patient ID: Victoria Hess, female    DOB: 04-Mar-1946, 72 y.o.   MRN: [id number]  HPI 72 year old Female in for Medicare Wellness, routine health maintenance, and evaluation of multiple medical issues.  History of hypertension, hypothyroidism, controlled type 2 diabetes mellitus, obesity, hyperlipidemia, metabolic syndrome and history of breast cancer.  History of bilateral ductal carcinoma in situ.  Had wide excision procedure of the left breast in 2011 and the lesser procedure done on the right breast in 2002.  Tonsillectomy and adenoidectomy 1955.  Appendectomy and tubal ligation 1977.  History of uterine fibroids cystocele and uterine prolapse.  Social history: Works with special needs students.  Lives in [place] she is married.  Has been in good health.  Does not smoke or consume alcohol.  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  Family history: Father died at age 39 of a stroke.  Mother died from coronary disease.  One brother with history of hypertension and diabetes.  One sister with hypertension and diabetes.  Another sister with hypertension.  One sister in good health without medical issues.     Review of Systems  Constitutional: Negative.   All other systems reviewed and are negative.      Objective:   Physical Exam  Constitutional: She is oriented to person, place, and time. She appears well-developed and well-nourished. No distress.  HENT:  Head: Normocephalic and atraumatic.  Right Ear: External ear normal.  Left Ear: External ear normal.  Nose: Nose normal.  Mouth/Throat: Oropharynx is clear and moist. No oropharyngeal exudate.  Eyes: Pupils are equal, round, and reactive to light. Conjunctivae and EOM are normal. Right eye exhibits no discharge. Left eye exhibits no discharge.  Neck: Neck supple. No JVD present. No thyromegaly present.  Cardiovascular: Normal rate, regular rhythm, normal heart sounds and intact distal pulses.    No murmur heard. Pulmonary/Chest: Effort normal. No stridor. No respiratory distress. She has no wheezes. She has no rales.  Abdominal: Soft. Bowel sounds are normal. She exhibits no distension and no mass. There is no tenderness. There is no guarding.  Musculoskeletal: She exhibits no edema.  Lymphadenopathy:    She has no cervical adenopathy.  Neurological: She is alert and oriented to person, place, and time. No cranial nerve deficit or sensory deficit. She exhibits normal muscle tone. Coordination normal.  Skin: Skin is warm and dry. She is not diaphoretic.  Psychiatric: She has a normal mood and affect. Her behavior is normal. Judgment and thought content normal.          Assessment & Plan:  Essential hypertension-stable at 140/90  Hyperlipidemia-stable on medication  History of breast cancer  Hypothyroidism-stable on thyroid replacement medication  Controlled type 2 diabetes mellitus  Anemia -hemoglobin 10 g and previously was 11.6 g 1 year ago  Plan: She will take iron sulfate 325 mg twice daily and follow-up here in 8 weeks.  B12 and folate levels are normal.  May need to consider colonoscopy  Subjective:   Patient presents for Medicare Annual/Subsequent preventive examination.  Review Past Medical/Family/Social: See above   Risk Factors  Current exercise habits: Some light exercise Dietary issues discussed: Low-fat low carbohydrate  Cardiac risk factors: Family history, diabetes, hyperlipidemia, hypertension  Depression Screen  (Note: if answer to either of the following is "Yes", a more complete depression screening is indicated)   Over the past two weeks, have you felt down, depressed or hopeless? No  Over  the past two weeks, have you felt little interest or pleasure in doing things? No Have you lost interest or pleasure in daily life? No Do you often feel hopeless? No Do you cry easily over simple problems? No   Activities of Daily Living  In your  present state of health, do you have any difficulty performing the following activities?:   Driving? No  Managing money? No  Feeding yourself? No  Getting from bed to chair? No  Climbing a flight of stairs? No  Preparing food and eating?: No  Bathing or showering? No  Getting dressed: No  Getting to the toilet? No  Using the toilet:No  Moving around from place to place: No  In the past year have you fallen or had a near fall?:No  Are you sexually active? No  Do you have more than one partner? No   Hearing Difficulties: No  Do you often ask people to speak up or repeat themselves? No  Do you experience ringing or noises in your ears? No  Do you have difficulty understanding soft or whispered voices? No  Do you feel that you have a problem with memory? No Do you often misplace items? No    Home Safety:  Do you have a smoke alarm at your residence? Yes Do you have grab bars in the bathroom?  No Do you have throw rugs in your house?  Yes   Cognitive Testing  Alert? Yes Normal Appearance?Yes  Oriented to person? Yes Place? Yes  Time? Yes  Recall of three objects? Yes  Can perform simple calculations? Yes  Displays appropriate judgment?Yes  Can read the correct time from a watch face?Yes   List the Names of Other Physician/Practitioners you currently use:  See referral list for the physicians patient is currently seeing.     Review of Systems: See above   Objective:     General appearance: Appears stated age and mildly obese  Head: Normocephalic, without obvious abnormality, atraumatic  Eyes: conj clear, EOMi PEERLA  Ears: normal TM's and external ear canals both ears  Nose: Nares normal. Septum midline. Mucosa normal. No drainage or sinus tenderness.  Throat: lips, mucosa, and tongue normal; teeth and gums normal  Neck: no adenopathy, no carotid bruit, no JVD, supple, symmetrical, trachea midline and thyroid not enlarged, symmetric, no tenderness/mass/nodules  No  CVA tenderness.  Lungs: clear to auscultation bilaterally  Breasts: normal appearance, no masses  Heart: regular rate and rhythm, S1, S2 normal, no murmur, click, rub or gallop  Abdomen: soft, non-tender; bowel sounds normal; no masses, no organomegaly  Musculoskeletal: ROM normal in all joints, no crepitus, no deformity, Normal muscle strengthen. Back  is symmetric, no curvature. Skin: Skin color, texture, turgor normal. No rashes or lesions  Lymph nodes: Cervical, supraclavicular, and axillary nodes normal.  Neurologic: CN 2 -12 Normal, Normal symmetric reflexes. Normal coordination and gait  Psych: Alert & Oriented x 3, Mood appear stable.    Assessment:    Annual wellness medicare exam   Plan:    During the course of the visit the patient was educated and counseled about appropriate screening and preventive services including:   Flu vaccine  Annual mammogram     Patient Instructions (the written plan) was given to the patient.  Medicare Attestation  I have personally reviewed:  The patient's medical and social history  Their use of alcohol, tobacco or illicit drugs  Their current medications and supplements  The patient's functional ability including ADLs,fall  risks, home safety risks, cognitive, and hearing and visual impairment  Diet and physical activities  Evidence for depression or mood disorders  The patient's weight, height, BMI, and visual acuity have been recorded in the chart. I have made referrals, counseling, and provided education to the patient based on review of the above and I have provided the patient with a written personalized care plan for preventive services.

## 2018-02-22 ENCOUNTER — Ambulatory Visit
Admission: RE | Admit: 2018-02-22 | Discharge: 2018-02-22 | Disposition: A | Discharge status: Home / Self Care | Payer: Medicare Other | Source: Ambulatory Visit | Attending: Internal Medicine | Admitting: Internal Medicine

## 2018-02-22 DIAGNOSIS — Z1231 Encounter for screening mammogram for malignant neoplasm of breast: Secondary | ICD-10-CM | POA: Diagnosis not present

## 2018-02-22 LAB — RETICULOCYTES
ABS Retic: 43230 cells/uL (ref 20000–8000)
RETIC CT PCT: 1.1 %

## 2018-02-22 LAB — IRON,TIBC AND FERRITIN PANEL
%SAT: 9 % — AB (ref 16–45)
FERRITIN: 107 ng/mL (ref 16–288)
IRON: 27 ug/dL — AB (ref 45–160)
TIBC: 305 mcg/dL (calc) (ref 250–450)

## 2018-02-22 LAB — FOLATE: Folate: >24 ng/mL

## 2018-02-22 LAB — VITAMIN B12: Vitamin B-12: >2000 pg/mL — ABNORMAL HIGH (ref 200–1100)

## 2018-03-09 ENCOUNTER — Other Ambulatory Visit: Payer: Self-pay | Admitting: Internal Medicine

## 2018-03-14 NOTE — Patient Instructions (Signed)
Anemia studies show patient be iron deficient.  Follow-up in 8 weeks after trial of iron 325 mg twice daily.  Continue other medications as previously ordered.

## 2018-04-01 ENCOUNTER — Other Ambulatory Visit: Payer: Self-pay

## 2018-04-19 ENCOUNTER — Other Ambulatory Visit: Payer: Medicare Other | Admitting: Internal Medicine

## 2018-04-19 DIAGNOSIS — D649 Anemia, unspecified: Secondary | ICD-10-CM

## 2018-04-20 LAB — CBC WITH DIFFERENTIAL/PLATELET
BASOS ABS: 31 {cells}/uL (ref 0–200)
Basophils Relative: 0.4 %
EOS PCT: 1.7 %
Eosinophils Absolute: 133 cells/uL (ref 15–500)
HEMATOCRIT: 30.2 % — AB (ref 35.0–45.0)
Hemoglobin: 9.8 g/dL — ABNORMAL LOW (ref 11.7–15.5)
LYMPHS ABS: 1069 {cells}/uL (ref 850–3900)
MCH: 25.9 pg — ABNORMAL LOW (ref 27.0–33.0)
MCHC: 32.5 g/dL (ref 32.0–36.0)
MCV: 79.9 fL — ABNORMAL LOW (ref 80.0–100.0)
MONOS PCT: 8 %
MPV: 12.1 fL (ref 7.5–12.5)
Neutro Abs: 5944 cells/uL (ref 1500–7800)
Neutrophils Relative %: 76.2 %
Platelets: 300 10*3/uL (ref 140–400)
RBC: 3.78 10*6/uL — AB (ref 3.80–5.10)
RDW: 15.4 % — ABNORMAL HIGH (ref 11.0–15.0)
Total Lymphocyte: 13.7 %
WBC mixed population: 624 cells/uL (ref 200–950)
WBC: 7.8 10*3/uL (ref 3.8–10.8)

## 2018-04-20 LAB — IRON, TOTAL/TOTAL IRON BINDING CAP
%SAT: 12 % — AB (ref 16–45)
IRON: 37 ug/dL — AB (ref 45–160)
TIBC: 299 ug/dL (ref 250–450)

## 2018-04-20 LAB — FERRITIN: Ferritin: 71 ng/mL (ref 16–288)

## 2018-04-22 ENCOUNTER — Encounter: Payer: Self-pay | Admitting: Internal Medicine

## 2018-04-22 ENCOUNTER — Ambulatory Visit (INDEPENDENT_AMBULATORY_CARE_PROVIDER_SITE_OTHER): Payer: Medicare Other | Admitting: Internal Medicine

## 2018-04-22 VITALS — BP 120/80 | HR 72 | Temp 98.3°F | Ht 64.0 in | Wt 169.0 lb

## 2018-04-22 DIAGNOSIS — D509 Iron deficiency anemia, unspecified: Secondary | ICD-10-CM | POA: Diagnosis not present

## 2018-04-22 NOTE — Progress Notes (Signed)
   Subjective:    Patient ID: Victoria Hess, female    DOB: 04-18-1946, 72 y.o.   MRN: [id number]  HPI 72 year old Female for follow up on iron deficiency. Minimal improvement on oral FeSO4 325 mg bid. Had clean colonoscopy with 10 year follow up by Dr. Loletha Carrow in Fall 2018.  Both parents had anemia and took iron for years not sure if this was? Thalassemia trait or not.  She says sister had to have IV iron treatments.      Review of Systems denies melena or BRBPR     Objective:   Physical Exam Labs reviewed.  Patient not examined today. Discussion about causes of iron deficiency. Unlikely to be GI source. Her hemoglobin is 9.8 g and 2 months ago was 10 g.  1 year ago was 11.6 g. Serum iron is 37 with percent saturation 12% and ferritin is 71     Assessment & Plan:  Iron deficiency anemia not responding to oral iron  Plan: Referral to Hematology for evaluation of anemia and consideration of IV iron.

## 2018-05-07 ENCOUNTER — Telehealth: Payer: Self-pay | Admitting: Oncology

## 2018-05-07 NOTE — Telephone Encounter (Signed)
Scheduled appt per 12/24 sch message - unable to reach pt - left message with appt date and time

## 2018-05-14 NOTE — Patient Instructions (Signed)
Labs reviewed with her.  Remains anemic iron deficient.  Hematology consultation requested.  Unlikely to be GI source with colonoscopy 2018 being normal.  Has not responded to oral iron therapy.

## 2018-05-16 ENCOUNTER — Other Ambulatory Visit: Payer: Self-pay | Admitting: Adult Health

## 2018-05-16 DIAGNOSIS — D0512 Intraductal carcinoma in situ of left breast: Secondary | ICD-10-CM

## 2018-05-16 DIAGNOSIS — C50912 Malignant neoplasm of unspecified site of left female breast: Secondary | ICD-10-CM

## 2018-05-17 ENCOUNTER — Encounter: Payer: Self-pay | Admitting: Adult Health

## 2018-05-17 ENCOUNTER — Telehealth: Payer: Self-pay | Admitting: Oncology

## 2018-05-17 ENCOUNTER — Inpatient Hospital Stay: Payer: Medicare Other | Attending: Adult Health | Admitting: Adult Health

## 2018-05-17 ENCOUNTER — Inpatient Hospital Stay: Payer: Medicare Other

## 2018-05-17 VITALS — BP 144/75 | HR 63 | Temp 98.5°F | Resp 18 | Ht 64.0 in | Wt 172.7 lb

## 2018-05-17 DIAGNOSIS — E119 Type 2 diabetes mellitus without complications: Secondary | ICD-10-CM | POA: Diagnosis not present

## 2018-05-17 DIAGNOSIS — D509 Iron deficiency anemia, unspecified: Secondary | ICD-10-CM | POA: Diagnosis not present

## 2018-05-17 DIAGNOSIS — Z170 Estrogen receptor positive status [ER+]: Secondary | ICD-10-CM | POA: Diagnosis not present

## 2018-05-17 DIAGNOSIS — I10 Essential (primary) hypertension: Secondary | ICD-10-CM | POA: Diagnosis not present

## 2018-05-17 DIAGNOSIS — Z7982 Long term (current) use of aspirin: Secondary | ICD-10-CM | POA: Diagnosis not present

## 2018-05-17 DIAGNOSIS — Z9181 History of falling: Secondary | ICD-10-CM | POA: Diagnosis not present

## 2018-05-17 DIAGNOSIS — Z9223 Personal history of estrogen therapy: Secondary | ICD-10-CM | POA: Diagnosis not present

## 2018-05-17 DIAGNOSIS — R5383 Other fatigue: Secondary | ICD-10-CM | POA: Insufficient documentation

## 2018-05-17 DIAGNOSIS — Z7984 Long term (current) use of oral hypoglycemic drugs: Secondary | ICD-10-CM

## 2018-05-17 DIAGNOSIS — D508 Other iron deficiency anemias: Secondary | ICD-10-CM

## 2018-05-17 DIAGNOSIS — N814 Uterovaginal prolapse, unspecified: Secondary | ICD-10-CM | POA: Diagnosis not present

## 2018-05-17 DIAGNOSIS — C50912 Malignant neoplasm of unspecified site of left female breast: Secondary | ICD-10-CM

## 2018-05-17 DIAGNOSIS — Z79899 Other long term (current) drug therapy: Secondary | ICD-10-CM | POA: Insufficient documentation

## 2018-05-17 DIAGNOSIS — Z923 Personal history of irradiation: Secondary | ICD-10-CM | POA: Diagnosis not present

## 2018-05-17 DIAGNOSIS — E785 Hyperlipidemia, unspecified: Secondary | ICD-10-CM

## 2018-05-17 DIAGNOSIS — D0512 Intraductal carcinoma in situ of left breast: Secondary | ICD-10-CM

## 2018-05-17 DIAGNOSIS — Z853 Personal history of malignant neoplasm of breast: Secondary | ICD-10-CM | POA: Diagnosis not present

## 2018-05-17 LAB — CMP (CANCER CENTER ONLY)
ALK PHOS: 111 U/L (ref 38–126)
ALT: 11 U/L (ref 0–44)
ANION GAP: 9 (ref 5–15)
AST: 14 U/L — ABNORMAL LOW (ref 15–41)
Albumin: 3.6 g/dL (ref 3.5–5.0)
BUN: 19 mg/dL (ref 8–23)
CALCIUM: 9.8 mg/dL (ref 8.9–10.3)
CHLORIDE: 104 mmol/L (ref 98–111)
CO2: 28 mmol/L (ref 22–32)
Creatinine: 0.83 mg/dL (ref 0.44–1.00)
GFR, Est AFR Am: >60 mL/min (ref >60)
GFR, Estimated: >60 mL/min (ref >60)
Glucose, Bld: 83 mg/dL (ref 70–99)
Potassium: 4.3 mmol/L (ref 3.5–5.1)
SODIUM: 141 mmol/L (ref 135–145)
Total Bilirubin: <0.2 mg/dL — ABNORMAL LOW (ref 0.3–1.2)
Total Protein: 7.5 g/dL (ref 6.5–8.1)

## 2018-05-17 LAB — CBC WITH DIFFERENTIAL (CANCER CENTER ONLY)
ABS IMMATURE GRANULOCYTES: 0.02 10*3/uL (ref 0.00–0.07)
Basophils Absolute: 0 10*3/uL (ref 0.0–0.1)
Basophils Relative: 0 %
EOS ABS: 0.1 10*3/uL (ref 0.0–0.5)
Eosinophils Relative: 2 %
HEMATOCRIT: 32.6 % — AB (ref 36.0–46.0)
HEMOGLOBIN: 10.1 g/dL — AB (ref 12.0–15.0)
IMMATURE GRANULOCYTES: 0 %
LYMPHS ABS: 1.1 10*3/uL (ref 0.7–4.0)
LYMPHS PCT: 15 %
MCH: 26 pg (ref 26.0–34.0)
MCHC: 31 g/dL (ref 30.0–36.0)
MCV: 83.8 fL (ref 80.0–100.0)
MONOS PCT: 9 %
Monocytes Absolute: 0.7 10*3/uL (ref 0.1–1.0)
NEUTROS PCT: 74 %
Neutro Abs: 5.5 10*3/uL (ref 1.7–7.7)
Platelet Count: 301 10*3/uL (ref 150–400)
RBC: 3.89 MIL/uL (ref 3.87–5.11)
RDW: 16.7 % — AB (ref 11.5–15.5)
WBC Count: 7.4 10*3/uL (ref 4.0–10.5)
nRBC: 0 % (ref 0.0–0.2)

## 2018-05-17 LAB — RETICULOCYTES
Immature Retic Fract: 10.9 % (ref 2.3–15.9)
RBC.: 3.86 MIL/uL — AB (ref 3.87–5.11)
Retic Count, Absolute: 44 10*3/uL (ref 19.0–186.0)
Retic Ct Pct: 1.1 % (ref 0.4–3.1)

## 2018-05-17 NOTE — Telephone Encounter (Signed)
Gave avs and calendar ° °

## 2018-05-17 NOTE — Progress Notes (Addendum)
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  More recently, she had diagnostic mammography at [facility] on December 31, 2009 and this showed group calcifications in the left breast felt to be moderately suspicious for malignancy.  The patient was set up for stereotactic biopsy 01/04/2010 and the pathology report [id number] at the [facility]) showed a small focus of invasive adenocarcinoma, grade 1, with some evidence of in situ carcinoma.  The tumor was estrogen receptor positive at 100%, progesterone receptor positive at 100% and there was no Her-2 amplification by the Hercept test with a score of 1+.    Bilateral breast MRIs were performed February 04, 2010.  There was some postoperative change in the upper outer quadrant of the right breast with no suspicious findings.  In the left breast, lower outer quadrant, there was an irregular spiculated enhancing mass measuring 2.3 cm maximally.  There were no other masses associated with this and there were no suspicious internal mammary or axillary lymph nodes noted.    With this information, the patient was referred to Dr. Donne Hazel and he set her up for genetic testing which came back favorable (no BRCA-1 or BRCA-2 mutations detected).  He then set her up for lumpectomy and sentinel lymph node dissection which was performed March 07, 2010.  The final pathology from that procedure ([id number]) showed a Grade 1 invasive ductal carcinoma measuring 1.5 cm with margins negative and ample.  There  was no lymphovascular invasion.  One of four sentinel lymph nodes sampled had a micrometastatic deposit.  Repeat prognostic panel showed the tumor to be ER positive at 100%, PR positive at 66% and showed no Her-2 amplification by CISH with a ratio of 1.24.   Her subsequent history is as detailed below  INTERVAL HISTORY: Victoria Hess returns today for evaluation of her newly diagnosed iron deficiency.  She also has a history of estrogen psoitive breast cancer and graduated from our office in 2016.  She has kept up with her screening mammograms, most recently in 02/2018 that showed no evidence of malignancy, with breast density category B. We updated her family history below and in Epic.  She underwent genetic testing initially with diagnosis  She has been newly diagnosed with iron deficiency beginning in 02/2018.  She has longstanding h/o slight anemia, with hemoglobin in the upper 11 range, however when it decreased to around 10 her PCP ordered some iron studies, b12, folate, and reticulocyte count that demonstrated iron deficiency.  She started iron sulfate 325 mg BID, and tolerated it well.  She says she started getting hungrier when she started this.  She repeated iron studies 2 months later, and her level had only increased from 27 to 37.  She was referred to [place].    She denies any previous blood in her stool, or black tarry stool.  She did notice a stool color change once starting the iron.  Her last colonoscopy was in 2018.  She has never had an upper endoscopy.  The gastroenterologist who did her colonscopy was Dr.  Wilfrid Lund at [facility].    Victoria Hess has a prolapsed uterus and sometimes when it drops down outside of her vagina it will get irritated and she will have some bleeding. After shopping all day over Christmas time she noted some dark red spotting.  This is the only time this has happened in the past year.    Victoria Hess says that anemia runs in her family.  She says her parents are passed  away and took iron supplements.  Her older sister has iron deficiency and has received IV iron.  Her younger sister has iron deficiency, and took oral supplements previously.    REVIEW OF SYSTEMS: Victoria Hess is feeling well today.  She says she was initially tired about 2 months ago, but once she started the iron supplements she has had a ravenous appetite, and the fatigue has slowly dissipated.  She occasionally gets lower abdominal cramping since starting the iron tablets, but denies any constipation or nausea/vomiting.  She denies any dysphagia, post prandial epigastric pain, indigestion, reflux, odynophagia, bloating, abdominal pain.  Her last colonoscopy was in 2018, she has never had an upper endoscopy.  She is unable to exercise since she fell several years ago due to chronic pain in her left foot, and the prolapsed uterus that will protrude from her vagina with extended physical activity.  She denies any other issues today.  She is without chest pain, palpitations, cough, or shortness of breath.  She is without any new pain.  She denies bowel/bladder changes, fever, chills, lymphadenopathy, or breast changes.  A detailed ROS was otherwise non contributory.    PAST MEDICAL HISTORY: Past Medical History:  Diagnosis Date  . Cancer (Center) [id number]   Stage 2 breast cancer bil  . Diabetes mellitus without complication ([place])   . Hyperlipidemia   . Hypertension   . Personal history of radiation therapy 2002,2011  . Thyroid disease    hypothyroidism  . Wears dentures    partial  . Wears glasses     PAST SURGICAL HISTORY: Past Surgical History:  Procedure Laterality Date  . APPENDECTOMY    . BREAST LUMPECTOMY Right 2002  . BREAST LUMPECTOMY Left 03/07/2010  . BREAST SURGERY     left lumpectomy sentinel  node biopsy  . BREAST SURGERY     right lumpectomy  . COLONOSCOPY  2008   at [facility], "normal" exam  . TUBAL LIGATION      FAMILY HISTORY Family History  Problem Relation Age of Onset    . Diabetes Mother   . Heart disease Mother   . Iron deficiency Mother   . Diabetes Father   . Cancer Father   . Iron deficiency Father   . Diabetes Sister   . Hyperlipidemia Sister   . Iron deficiency Sister   . Breast cancer Sister   . Iron deficiency Sister   . Breast cancer Cousin   . Breast cancer Maternal Aunt   . Hepatitis C Brother   . Cancer Brother        hepatocellular  . Colon cancer Neg Hx     GYNECOLOGIC HISTORY: The patient is GX P1, first pregnancy to term at age 12. She went through the change of life around the year 2000.  She did not receive hormone replacement.  SOCIAL HISTORY:  (updated 05/17/2018) Victoria Hess is retired and lives with her husband, Wilber Oliphant, who is also retired.  They live in [place], [place].  Daughter Angela Nevin who works at a [employer] in [place], [place].  The patient has two granddaughters, aged 55 and 12.  She is a Psychologist, forensic.   ADVANCED DIRECTIVES: not in place  HEALTH MAINTENANCE: (Updated 05/17/2018) Social History   Tobacco Use  . Smoking status: Never Smoker  . Smokeless tobacco: Never Used  Substance Use Topics  . Alcohol use: No  . Drug use: No     Colonoscopy: 2018  PAP: 08/2014  Bone density: 2015/ normal  Lipid panel: 02/2018, Dr. Renold Genta  Allergies  Allergen Reactions  . Irbesartan Cough and Other (See Comments)    Other Reaction: CONGESTION    Current Outpatient Medications  Medication Sig Dispense Refill  . amLODipine (NORVASC) 10 MG tablet TAKE 1 TABLET BY MOUTH  DAILY 90 tablet 3  . aspirin 81 MG tablet Take 81 mg by mouth daily.      . Blood Glucose Monitoring Suppl (ONE TOUCH ULTRA SYSTEM KIT) W/DEVICE KIT 1 kit by Does not apply route once. Test bid.  DX E11.9 1 each 0  . BYSTOLIC 20 MG TABS TAKE 1 TABLET BY MOUTH  DAILY 90 tablet 1  . Calcium Carbonate (CALCIUM 600 PO) Take by mouth daily.      . Cholecalciferol (VITAMIN D PO) Take 2,000 mg by mouth daily.      . furosemide (LASIX) 40 MG tablet TAKE 1 TABLET BY  MOUTH  DAILY 90 tablet 3  . glucose blood (BAYER CONTOUR NEXT TEST) test strip Use as instructed 100 each 12  . IRON PO Take by mouth 2 (two) times daily.    . Lancets (ONETOUCH ULTRASOFT) lancets Test BID.  DX E11.9 100 each 12  . levothyroxine (SYNTHROID, LEVOTHROID) 50 MCG tablet TAKE 1 TABLET BY MOUTH  DAILY 90 tablet 3  . metFORMIN (GLUCOPHAGE) 500 MG tablet TAKE 1 TABLET BY MOUTH TWO  TIMES DAILY WITH MEALS 180 tablet 1  . Multiple Vitamin (MULTIVITAMIN PO) Take by mouth daily.      . potassium chloride SA (K-DUR,KLOR-CON) 20 MEQ tablet TAKE 1 TABLET BY MOUTH TWO  TIMES DAILY 180 tablet 1  . simvastatin (ZOCOR) 10 MG tablet TAKE 1 TABLET BY MOUTH AT  BEDTIME 90 tablet 1   No current facility-administered medications for this visit.     OBJECTIVE: Middle-aged African American womanin no acute distress  Vitals:   05/17/18 1025  BP: (!) 144/75  Pulse: 63  Resp: 18  Temp: 98.5 F (36.9 C)  SpO2: 99%     Body mass index is 29.64 kg/m.    ECOG FS: 0 Filed Weights   05/17/18 1025  Weight: 172 lb 11.2 oz (78.3 kg)   GENERAL: Patient is a well appearing female in no acute distress HEENT:  Sclerae anicteric.  Oropharynx clear and moist. No ulcerations or evidence of oropharyngeal candidiasis. Neck is supple.  NODES:  No cervical, supraclavicular, or axillary lymphadenopathy palpated.  BREAST EXAM:  Right breast s/p lumpectomy, no sign of local recurrence, left breast s/p lumpectomy, no sign of local recurrence, radiation changes to breast noted LUNGS:  Clear to auscultation bilaterally.  No wheezes or rhonchi. HEART:  Regular rate and rhythm. No murmur appreciated. ABDOMEN:  Soft, nontender.  Positive, normoactive bowel sounds. No organomegaly palpated. MSK:  No focal spinal tenderness to palpation. Full range of motion bilaterally in the upper extremities. EXTREMITIES:  No peripheral edema.   SKIN:  Clear with no obvious rashes or skin changes. No nail dyscrasia. NEURO:  Nonfocal.  Well oriented.  Appropriate affect.   LAB RESULTS: Lab Results  Component Value Date   WBC 7.4 05/17/2018   NEUTROABS 5.5 05/17/2018   HGB 10.1 (L) 05/17/2018   HCT 32.6 (L) 05/17/2018   MCV 83.8 05/17/2018   PLT 301 05/17/2018      Chemistry      Component Value Date/Time   NA 141 05/17/2018 1017   NA 141 04/12/2015 1519   K 4.3 05/17/2018 1017   K 3.9 04/12/2015 1519   CL 104 05/17/2018 1017   CL 102 02/29/2012 1517   CO2 28 05/17/2018 1017   CO2 27 04/12/2015 1519   BUN 19 05/17/2018 1017   BUN 25.4 04/12/2015 1519   CREATININE 0.83 05/17/2018 1017   CREATININE 0.83 02/18/2018 0954   CREATININE 0.9 04/12/2015 1519      Component Value Date/Time   CALCIUM 9.8 05/17/2018 1017   CALCIUM 10.0 04/12/2015 1519   ALKPHOS 111 05/17/2018 1017   ALKPHOS 89 04/12/2015 1519   AST 14 (L) 05/17/2018 1017   AST 18 04/12/2015 1519   ALT 11 05/17/2018 1017   ALT 11 04/12/2015 1519   BILITOT <0.2 (L) 05/17/2018 1017   BILITOT <0.30 04/12/2015 1519       Lab Results  Component Value Date   LABCA2 34 02/29/2012      STUDIES:   ASSESSMENT: 73 y.o.  [place] woman:     1.  Status post right lumpectomy 2002 for what likely was a ductal carcinoma in situ, treated with radiation under [facility].     2.  Status post left lumpectomy October 2011 for a T1c N1 (mic) stage IB invasive ductal carcinoma, grade 1, strongly estrogen and progesterone receptor positive, HER2/neu negative.  Margins were ample.     3.  Radiation therapy completed in January 2012     4.  Letrozole January of 2012, completing 5 years December 2017   5.  Bone density 01/30/2014 at the [facility] was normal.   6.  Seen in 05/2018 for new onset anemia, ? Iron deficiency   PLAN: Victoria Hess is doing well today.  She met with myself and Dr. Jana Hakim and reviewed her previous lab work in detail.  The diagnosis of iron deficiency is unclear, because she has had a normal ferritin of 107 then 71.  She  also has no site of active bleeding.  Her kidney function is normal.  She will continue with her iron supplementation.  We will repeat lab work in a few weeks which includes: ferritin, iron studies, CBC, reticulocyte count, ESR, CRP, and ANA.  We will call her with those results.  Victoria Hess has no sign of breast cancer recurrence.  She will continue to have annual mammograms as Dr. Renold Genta is arranging her breast cancer surveillance.  She was initially tested in genetics in 2011.  We did recommend retesting her genetics and I placed a referral for this to be done.    Victoria Hess will return for genetics evaluation, and lab testing to be done on the same day.  We will call her with her lab results and there is no need for her to schedule a future appointment with [place].  We of course are happy to see her at any point in the future if needed.  Scot Dock, NP   05/17/2018 Hematology and [place] [address], [place] [address] [phone]   ADDENDUM: Victoria Hess is now a little over 8 years out from definitive surgery for her invasive breast cancer with no evidence of disease  recurrence.  This is very favorable.  She underwent genetics testing in 2011.  At that time we were only doing BRCA testing.  We are now doing a broader panel and I discussed that with her.  We will be setting her up for meeting with 1 of our genetics counselors to accomplish that  More recently she had a drop in her hemoglobin, from baseline 11.5, to 10.0 on 02/18/2018.  Her MCV also dropped from baseline 85-80.4 at that time.  Dr. Renold Genta obtained additional labs on 02/21/2018 and this showed a serum iron of 27 with a saturation of 9.  TIBC was 305.  B12 and folate were normal.  The reticulocyte count was 43, which is inappropriately low.  All of the above was consistent with iron deficiency, except that the ferritin on 02/21/2018 was 107.  She gives no history of clinically significant bleeding.   She had a colonoscopy in 2018 which showed mild diverticular disease.  The patient was started on iron supplementation and ferritin obtained 04/19/2018 was 71.  Victoria Hess has been on oral iron supplementation since that time.  However her hemoglobin today is only up to 10.1. The MCV is back to normal at 83.8.  Reticulocyte count is pending.  Estimated GFR today is greater than 60.  The anomalous finding of course is the ferritin greater than 100 in 10 October.  It is possible that the patient had been started on iron supplementation on 10/07.  A lab error is also always possible of course--her prior ferritin obtained in April 2013 was also 107, which could just be a coincidence or it could be the wrong result was entered. A third possibility would address the fact that ferritin is an acute phase reactant and in the face of inflammation or infection can be abnormally high.  Accordingly we will obtain some markers of inflammation when the patient returns 06/06/2018 for her genetics appointment, which of course will also involve lab draw.  If the ESR or CRP are elevated that may explain the ferritin and it also may explain why the hemoglobin has not risen to the prior 11.5 average  At this point we are not making the patient a follow-up appointment with me, but will follow-up with Dr. Renold Genta once we complete the work-up         I personally saw this patient and performed a substantive portion of this encounter with the listed APP documented above.   Chauncey Cruel, MD Medical Oncology and Hematology [facility] [address] [place], [place] [address] Tel. [phone]    Fax. [phone]

## 2018-06-05 ENCOUNTER — Other Ambulatory Visit: Payer: Self-pay | Admitting: Adult Health

## 2018-06-05 DIAGNOSIS — H25813 Combined forms of age-related cataract, bilateral: Secondary | ICD-10-CM | POA: Diagnosis not present

## 2018-06-05 DIAGNOSIS — E089 Diabetes mellitus due to underlying condition without complications: Secondary | ICD-10-CM | POA: Diagnosis not present

## 2018-06-05 DIAGNOSIS — H43813 Vitreous degeneration, bilateral: Secondary | ICD-10-CM | POA: Diagnosis not present

## 2018-06-05 DIAGNOSIS — H524 Presbyopia: Secondary | ICD-10-CM | POA: Diagnosis not present

## 2018-06-05 DIAGNOSIS — E119 Type 2 diabetes mellitus without complications: Secondary | ICD-10-CM | POA: Diagnosis not present

## 2018-06-06 ENCOUNTER — Inpatient Hospital Stay: Payer: Medicare Other

## 2018-06-06 ENCOUNTER — Inpatient Hospital Stay (HOSPITAL_BASED_OUTPATIENT_CLINIC_OR_DEPARTMENT_OTHER): Payer: Medicare Other | Admitting: Genetics

## 2018-06-06 DIAGNOSIS — D0512 Intraductal carcinoma in situ of left breast: Secondary | ICD-10-CM

## 2018-06-06 DIAGNOSIS — Z800 Family history of malignant neoplasm of digestive organs: Secondary | ICD-10-CM

## 2018-06-06 DIAGNOSIS — I10 Essential (primary) hypertension: Secondary | ICD-10-CM | POA: Diagnosis not present

## 2018-06-06 DIAGNOSIS — D508 Other iron deficiency anemias: Secondary | ICD-10-CM

## 2018-06-06 DIAGNOSIS — Z79899 Other long term (current) drug therapy: Secondary | ICD-10-CM | POA: Diagnosis not present

## 2018-06-06 DIAGNOSIS — R5383 Other fatigue: Secondary | ICD-10-CM | POA: Diagnosis not present

## 2018-06-06 DIAGNOSIS — C50912 Malignant neoplasm of unspecified site of left female breast: Secondary | ICD-10-CM

## 2018-06-06 DIAGNOSIS — E119 Type 2 diabetes mellitus without complications: Secondary | ICD-10-CM | POA: Diagnosis not present

## 2018-06-06 DIAGNOSIS — Z803 Family history of malignant neoplasm of breast: Secondary | ICD-10-CM

## 2018-06-06 DIAGNOSIS — Z9181 History of falling: Secondary | ICD-10-CM | POA: Diagnosis not present

## 2018-06-06 DIAGNOSIS — Z853 Personal history of malignant neoplasm of breast: Secondary | ICD-10-CM | POA: Diagnosis not present

## 2018-06-06 DIAGNOSIS — Z170 Estrogen receptor positive status [ER+]: Secondary | ICD-10-CM | POA: Diagnosis not present

## 2018-06-06 DIAGNOSIS — Z7984 Long term (current) use of oral hypoglycemic drugs: Secondary | ICD-10-CM | POA: Diagnosis not present

## 2018-06-06 DIAGNOSIS — Z7982 Long term (current) use of aspirin: Secondary | ICD-10-CM | POA: Diagnosis not present

## 2018-06-06 DIAGNOSIS — D509 Iron deficiency anemia, unspecified: Secondary | ICD-10-CM | POA: Diagnosis not present

## 2018-06-06 DIAGNOSIS — Z923 Personal history of irradiation: Secondary | ICD-10-CM | POA: Diagnosis not present

## 2018-06-06 DIAGNOSIS — E785 Hyperlipidemia, unspecified: Secondary | ICD-10-CM | POA: Diagnosis not present

## 2018-06-06 LAB — CBC WITH DIFFERENTIAL (CANCER CENTER ONLY)
ABS IMMATURE GRANULOCYTES: 0.02 10*3/uL (ref 0.00–0.07)
Basophils Absolute: 0 10*3/uL (ref 0.0–0.1)
Basophils Relative: 1 %
Eosinophils Absolute: 0.1 10*3/uL (ref 0.0–0.5)
Eosinophils Relative: 1 %
HCT: 35.4 % — ABNORMAL LOW (ref 36.0–46.0)
Hemoglobin: 10.8 g/dL — ABNORMAL LOW (ref 12.0–15.0)
Immature Granulocytes: 0 %
Lymphocytes Relative: 19 %
Lymphs Abs: 1.6 10*3/uL (ref 0.7–4.0)
MCH: 25.7 pg — ABNORMAL LOW (ref 26.0–34.0)
MCHC: 30.5 g/dL (ref 30.0–36.0)
MCV: 84.3 fL (ref 80.0–100.0)
Monocytes Absolute: 0.6 10*3/uL (ref 0.1–1.0)
Monocytes Relative: 7 %
NEUTROS ABS: 6.1 10*3/uL (ref 1.7–7.7)
Neutrophils Relative %: 72 %
Platelet Count: 321 10*3/uL (ref 150–400)
RBC: 4.2 MIL/uL (ref 3.87–5.11)
RDW: 16.6 % — ABNORMAL HIGH (ref 11.5–15.5)
WBC Count: 8.4 10*3/uL (ref 4.0–10.5)
nRBC: 0 % (ref 0.0–0.2)

## 2018-06-06 LAB — RETIC PANEL
Immature Retic Fract: 11.9 % (ref 2.3–15.9)
RBC.: 4.2 MIL/uL (ref 3.87–5.11)
RETIC CT PCT: 1 % (ref 0.4–3.1)
Retic Count, Absolute: 41.2 10*3/uL (ref 19.0–186.0)
Reticulocyte Hemoglobin: 29.9 pg (ref >27.9)

## 2018-06-06 LAB — C-REACTIVE PROTEIN: CRP: 1.3 mg/dL — ABNORMAL HIGH (ref <1.0)

## 2018-06-06 LAB — FERRITIN: FERRITIN: 87 ng/mL (ref 11–307)

## 2018-06-06 LAB — IRON AND TIBC
Iron: 35 ug/dL — ABNORMAL LOW (ref 41–142)
Saturation Ratios: 10 % — ABNORMAL LOW (ref 21–57)
TIBC: 343 ug/dL (ref 236–444)
UIBC: 308 ug/dL (ref 120–384)

## 2018-06-06 LAB — SEDIMENTATION RATE: Sed Rate: 66 mm/hr — ABNORMAL HIGH (ref 0–22)

## 2018-06-07 ENCOUNTER — Encounter: Payer: Self-pay | Admitting: Genetics

## 2018-06-07 DIAGNOSIS — Z853 Personal history of malignant neoplasm of breast: Secondary | ICD-10-CM | POA: Insufficient documentation

## 2018-06-07 DIAGNOSIS — Z803 Family history of malignant neoplasm of breast: Secondary | ICD-10-CM | POA: Insufficient documentation

## 2018-06-07 DIAGNOSIS — Z800 Family history of malignant neoplasm of digestive organs: Secondary | ICD-10-CM | POA: Insufficient documentation

## 2018-06-07 LAB — ANTINUCLEAR ANTIBODIES, IFA: ANTINUCLEAR ANTIBODIES, IFA: NEGATIVE

## 2018-06-07 NOTE — Progress Notes (Signed)
REFERRING PROVIDER: Gardenia Phlegm, NP [place], [place] [address]  PRIMARY PROVIDER:  Elby Showers, MD  PRIMARY REASON FOR VISIT:  1. 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 Family history of breast cancer   3. Family history of stomach cancer   4. Ductal carcinoma in situ (DCIS) of left breast   5. Personal history of breast cancer    HISTORY OF PRESENT ILLNESS:   Victoria Hess, a 73 y.o. female, was seen for a [place] cancer genetics consultation at the request of Wilber Bihari, NP due to a personal and family history of breast cancer.  Victoria Hess presents to clinic today to discuss the possibility of a hereditary predisposition to cancer, genetic testing, and to further clarify her future cancer risks, as well as potential cancer risks for family members.   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  In 2011, she was dx with Invasive adenocarcinoma of the left breast.  She had lumpectomy radiation and antiestrogen therapy.  In 2011 she had genetic testing for the Comprehensive BRCAnalysis that was negative.  (No panel).   HORMONAL RISK FACTORS:  First live birth at age 54.  Ovaries intact: yes.  Hysterectomy: no.  Menopausal status: postmenopausal.  HRT use: 0 years. Colonoscopy: yes; 2018, no polyps. Mammogram within the last year: yes.  Past Medical History:  Diagnosis Date  . Cancer ([place]) [id number]   Stage 2 breast cancer bil  . Diabetes mellitus without complication ([place])   . Family history of breast cancer   . Family history of stomach cancer   . Hyperlipidemia   . Hypertension   . Personal history of radiation therapy 2002,2011  . Thyroid disease    hypothyroidism  . Wears dentures    partial  . Wears glasses     Past Surgical History:  Procedure Laterality Date  . APPENDECTOMY    . BREAST LUMPECTOMY Right 2002  .  BREAST LUMPECTOMY Left 03/07/2010  . BREAST SURGERY     left lumpectomy sentinel  node biopsy  . BREAST SURGERY     right lumpectomy  . COLONOSCOPY  2008   at [facility], "normal" exam  . TUBAL LIGATION      Social History   Socioeconomic History  . Marital status: Married    Spouse name: Not on file  . Number of children: Not on file  . Years of education: Not on file  . Highest education level: Not on file  Occupational History  . Not on file  Social Needs  . Financial resource strain: Not very hard  . Food insecurity:    Worry: Never true    Inability: Never true  . Transportation needs:    Medical: No    Non-medical: No  Tobacco Use  . Smoking status: Never Smoker  . Smokeless tobacco: Never Used  Substance and Sexual Activity  . Alcohol use: No  . Drug use: No  . Sexual activity: Not Currently    Birth control/protection: Post-menopausal  Lifestyle  . Physical activity:    Days per week: 0 days    Minutes per session: Not on file  . Stress: Not on file  Relationships  . Social connections:    Talks on phone: Not on file    Gets together: Not on file    Attends religious service: Not on file  Active member of club or organization: Not on file    Attends meetings of clubs or organizations: Not on file    Relationship status: Not on file  Other Topics Concern  . Not on file  Social History Narrative  . Not on file     FAMILY HISTORY:  We obtained a detailed, 4-generation family history.  Significant diagnoses are listed below: Family History  Problem Relation Age of Onset  . Diabetes Mother   . Heart disease Mother   . Iron deficiency Mother   . Diabetes Father   . Iron deficiency Father   . Stomach cancer Father 50  . Diabetes Sister   . Hyperlipidemia Sister   . Iron deficiency Sister   . Breast cancer Sister 67       DCIS  . Iron deficiency Sister   . Breast cancer Cousin   . Breast cancer Maternal Aunt 80  . Hepatitis C Brother   . Cancer  Brother        hepatocellular  . Colon cancer Neg Hx     Victoria Hess has 1 daughter, Victoria Hess, who is 81 with no hx of cancer.  Victoria Hess has 2 granddaughters ages 22 and 29 with no hx of cancer. Victoria Hess has 3 sisters and 1 brother. 1 sister was dx with breast cancer (DCIS) at 44, and is now 68.  Her brother had liver cancer.   Victoria Hess father: died at 51, had stomach cancer at age 75.   Paternal Aunts/Uncles: 2 paternal aunts and 2 paternal uncles with no known hx of cancer.  Paternal cousins: no known hx of cancer, limited info Paternal grandfather: no hx of cancer Paternal grandmother:died young in childbirth.   Victoria Hess mother: died at 60 with no hx of cancer.  Maternal Aunts/Uncles: 4 maternal uncles and 7  Maternal aunts. 1 aunt was dx with breast cancer 80+.  Maternal cousins: no known hx of cancer.  Maternal grandfather: no hx of cancer.  Maternal grandmother:died 'middle-aged" with no hx of cancer.   Victoria Hess is unaware of previous family history of genetic testing for hereditary cancer risks. Patient's maternal ancestors are of African American/Native American descent, and paternal ancestors are of African American descent. There is no reported Ashkenazi Jewish ancestry. There is no known consanguinity.  GENETIC COUNSELING ASSESSMENT: Victoria Hess is a 73 y.o. female with a personal and family history which is somewhat suggestive of a Hereditary Cancer Predisposition Syndrome. We, therefore, discussed and recommended the following at today's visit.   DISCUSSION: We reviewed the characteristics, features and inheritance patterns of hereditary cancer syndromes. We also discussed genetic testing, including the appropriate family members to test, the process of testing, insurance coverage and turn-around-time for results. We discussed the implications of a negative, positive and/or variant of uncertain significant result. We recommended Victoria Hess pursue genetic testing for the  Common Hereditary Cancers gene panel.   The Common Hereditary Cancer Panel offered by [facility] includes sequencing and/or deletion duplication testing of the following 53 genes: APC, ATM, AXIN2, BARD1, BMPR1A, BRCA1, BRCA2, BRIP1, BUB1B, CDH1, CDK4, CDKN2A, CHEK2, CTNNA1, DICER1, ENG, EPCAM, GALNT12, GREM1, HOXB13, KIT, MEN1, MLH1, MLH3, MSH2, MSH3, MSH6, MUTYH, NBN, NF1, NTHL1, PALB2, PDGFRA, PMS2, POLD1, POLE, PTEN, RAD50, RAD51C, RAD51D, RNF43, RPS20, SDHA, SDHB, SDHC, SDHD, SMAD4, SMARCA4, STK11, TP53, TSC1, TSC2, VHL  We discussed that only 5-10% of cancers are associated with a Hereditary cancer predisposition syndrome.  One of the most common hereditary cancer syndromes  that increases breast cancer risk is called Hereditary Breast and Ovarian Cancer (HBOC) syndrome.  This syndrome is caused by mutations in the BRCA1 and BRCA2 genes.  She did already have genetic testing for these genes.  However, we discussed that since 2011, significant advances have been made and several other breast cancer risk genes have been identified (ex: PALB2, ATM, CHEK2, etc).   There are also many other cancer predisposition syndromes caused by mutations in several other genes.  We discussed that if she is found to have a mutation in one of these genes, it may impact future medical management recommendations such as increased cancer screenings and consideration of risk reducing surgeries.  A positive result could also have implications for the patient's family members.  A Negative result would mean we were unable to identify a hereditary component to her cancer, but does not rule out the possibility of a hereditary basis for her cancer.  There could be mutations that are undetectable by current technology, or in genes not yet tested or identified to increase cancer risk.    We discussed the potential to find a Variant of Uncertain Significance or VUS.  These are variants that have not yet been identified as pathogenic or  benign, and it is unknown if this variant is associated with increased cancer risk or if this is a normal finding.  Most VUS's are reclassified to benign or likely benign.   It should not be used to make medical management decisions. With time, we suspect the lab will determine the significance of any VUS's identified if any.   Based on Victoria Hess's personal and family history of cancer, she meets medical criteria for genetic testing. Despite that she meets criteria, she may still have an out of pocket cost. The laboratory can provide her with an estimate of her OOP cost.  she was given the contact information for the laboratory if she has further questions. Marland Kitchen   PLAN: After considering the risks, benefits, and limitations, Victoria Hess  provided informed consent to pursue genetic testing and the blood sample was sent to [facility] for analysis of the Common Hereditary Cancers Panel. Results should be available within approximately 2-3 weeks' time, at which point they will be disclosed by telephone to Victoria Hess, as will any additional recommendations warranted by these results. Victoria Hess will receive a summary of her genetic counseling visit and a copy of her results once available. This information will also be available in Epic. We encouraged Victoria Hess. Dearmond to remain in contact with cancer genetics annually so that we can continuously update the family history and inform her of any changes in cancer genetics and testing that may be of benefit for her family. Victoria Hess. Kohn questions were answered to her satisfaction today. Our contact information was provided should additional questions or concerns arise.  Based on Victoria Hess's family history, we recommended her sister, who was diagnosed with breast cancer  at age 27, have genetic counseling and testing. Victoria Hess will let us know if we can be of any assistance in coordinating genetic counseling and/or testing for this family member.   Lastly, we encouraged  Victoria Hess. Sinagra to remain in contact with cancer genetics annually so that we can continuously update the family history and inform her of any changes in cancer genetics and testing that may be of benefit for this family.   Victoria Hess.  Stokes questions were answered to her satisfaction today. Our contact information was provided should additional questions  or concerns arise. Thank you for the referral and allowing [place] to share in the care of your patient.   Tana Felts, Victoria Hess, [facility] Certified Genetic Counselor [email or web address] phone: [phone]  The patient was seen for a total of 35 minutes in face-to-face genetic counseling.  This patient was discussed with Drs. Magrinat, Lindi Adie and/or Burr Medico who agrees with the above.

## 2018-06-17 ENCOUNTER — Ambulatory Visit: Payer: Self-pay | Admitting: Genetics

## 2018-06-17 ENCOUNTER — Encounter: Payer: Self-pay | Admitting: Genetics

## 2018-06-17 ENCOUNTER — Telehealth: Payer: Self-pay | Admitting: Genetics

## 2018-06-17 DIAGNOSIS — Z1379 Encounter for other screening for genetic and chromosomal anomalies: Secondary | ICD-10-CM

## 2018-06-17 DIAGNOSIS — D0512 Intraductal carcinoma in situ of left breast: Secondary | ICD-10-CM

## 2018-06-17 DIAGNOSIS — C50912 Malignant neoplasm of unspecified site of left female breast: Secondary | ICD-10-CM

## 2018-06-17 DIAGNOSIS — Z803 Family history of malignant neoplasm of breast: Secondary | ICD-10-CM

## 2018-06-17 DIAGNOSIS — Z853 Personal history of malignant neoplasm of breast: Secondary | ICD-10-CM

## 2018-06-17 DIAGNOSIS — Z800 Family history of malignant neoplasm of digestive organs: Secondary | ICD-10-CM

## 2018-06-17 NOTE — Telephone Encounter (Signed)
Revealed negative genetic testing.   This normal result is reassuring and indicates that it is unlikely Victoria Hess's cancer is due to a hereditary cause.  It is unlikely that there is an increased risk of another cancer due to a mutation in one of these genes.  However, genetic testing is not perfect, and cannot definitively rule out a hereditary cause.  It will be important for her to keep in contact with genetics to learn if any additional testing may be needed in the future.     We recommended her sister who had breast cancer at 10 also consider testing as there could be a genetic cause for her cancer that Victoria Hess did not inherit and therefore was not found in her.   Victoria Hess should continue to follow her healthcare providers' recommendations regarding cancer management and screening.

## 2018-06-17 NOTE — Progress Notes (Signed)
HPI:  Ms. Camilo was previously seen in the [facility] clinic on 06/06/2018 due to a personal and family history of breast cancer and concerns regarding a hereditary predisposition to cancer. Please refer to our prior cancer genetics clinic note for more information regarding Ms. Bussa's medical, social and family histories, and our assessment and recommendations, at the time. Ms. Witkop recent genetic test results were disclosed to her, as well as recommendations warranted by these results. These results and recommendations are discussed in more detail below.  CANCER HISTORY:  In 2002, at the age of 75, Ms. Burtt was diagnosed with DCIS of the right breast.  In 2011, she was dx with Invasive adenocarcinoma of the left breast.  She had lumpectomy radiation and antiestrogen therapy.  In 2011 she had genetic testing for the Comprehensive BRCAnalysis that was negative.  (No panel).    FAMILY HISTORY:  We obtained a detailed, 4-generation family history.  Significant diagnoses are listed below: Family History  Problem Relation Age of Onset  . Diabetes Mother   . Heart disease Mother   . Iron deficiency Mother   . Diabetes Father   . Iron deficiency Father   . Stomach cancer Father 76  . Diabetes Sister   . Hyperlipidemia Sister   . Iron deficiency Sister   . Breast cancer Sister 32       DCIS  . Iron deficiency Sister   . Breast cancer Cousin   . Breast cancer Maternal Aunt 80  . Hepatitis C Brother   . Cancer Brother        hepatocellular  . Colon cancer Neg Hx     Ms. Bents has 1 daughter, Angela Nevin, who is 35 with no hx of cancer.  Ms. Croker has 2 granddaughters ages 11 and 27 with no hx of cancer. Ms. Ouk has 3 sisters and 1 brother. 1 sister was dx with breast cancer (DCIS) at 36, and is now 75.  Her brother had liver cancer.   Ms. Wherry father: died at 46, had stomach cancer at age 42.   Paternal Aunts/Uncles: 2 paternal aunts and 2 paternal uncles with no known hx  of cancer.  Paternal cousins: no known hx of cancer, limited info Paternal grandfather: no hx of cancer Paternal grandmother:died young in childbirth.   Ms. Goforth mother: died at 76 with no hx of cancer.  Maternal Aunts/Uncles: 4 maternal uncles and 7  Maternal aunts. 1 aunt was dx with breast cancer 80+.  Maternal cousins: no known hx of cancer.  Maternal grandfather: no hx of cancer.  Maternal grandmother:died 'middle-aged" with no hx of cancer.   Ms. Cajas is unaware of previous family history of genetic testing for hereditary cancer risks. Patient's maternal ancestors are of African American/Native American descent, and paternal ancestors are of African American descent. There is no reported Ashkenazi Jewish ancestry. There is no known consanguinity.  GENETIC TEST RESULTS: Genetic testing performed through [facility]'s Common Hereditary Cancers Panel reported out on 06/16/2018 showed no pathogenic mutations.   The Common Hereditary Cancer Panel offered by [facility] includes sequencing and/or deletion duplication testing of the following 53 genes: APC, ATM, AXIN2, BARD1, BMPR1A, BRCA1, BRCA2, BRIP1, BUB1B, CDH1, CDK4, CDKN2A, CHEK2, CTNNA1, DICER1, ENG, EPCAM, GALNT12, GREM1, HOXB13, KIT, MEN1, MLH1, MLH3, MSH2, MSH3, MSH6, MUTYH, NBN, NF1, NTHL1, PALB2, PDGFRA, PMS2, POLD1, POLE, PTEN, RAD50, RAD51C, RAD51D, RNF43, RPS20, SDHA, SDHB, SDHC, SDHD, SMAD4, SMARCA4, STK11, TP53, TSC1, TSC2, VHL.  The test report will be scanned  into EPIC and will be located under the Molecular Pathology section of the Results Review tab. A portion of the result report is included below for reference.     We discussed with Ms. Gatlin that because current genetic testing is not perfect, it is possible there may be a gene mutation in one of these genes that current testing cannot detect, but that chance is small.  We also discussed, that there could be another gene that has not yet been discovered, or that we have not  yet tested, that is responsible for the cancer diagnoses in the family. It is also possible there is a hereditary cause for the cancer in the family that Ms. Halseth did not inherit and therefore was not identified in her testing.  Therefore, it is important to remain in touch with cancer genetics in the future so that we can continue to offer Ms. Stanis the most up to date genetic testing.   ADDITIONAL GENETIC TESTING: We discussed with Ms. Wahba that her genetic testing was fairly extensive.  If there are are genes identified to increase cancer risk that can be analyzed in the future, we would be happy to discuss and coordinate this testing at that time.    CANCER SCREENING RECOMMENDATIONS: Ms. Sutherlin test result is considered negative (normal).  This means that we have not identified a hereditary cause for her personal and family history of cancer at this time.   While reassuring, this does not definitively rule out a hereditary predisposition to cancer. It is still possible that there could be genetic mutations that are undetectable by current technology, or genetic mutations in genes that have not been tested or identified to increase cancer risk.  Therefore, it is recommended she continue to follow the cancer management and screening guidelines provided by her oncology and primary healthcare provider. An individual's cancer risk is not determined by genetic test results alone.  Overall cancer risk assessment includes additional factors such as personal medical history, family history, etc.  These should be used to make a personalized plan for cancer prevention and surveillance.    RECOMMENDATIONS FOR FAMILY MEMBERS:  Relatives in this family might be at some increased risk of developing cancer, over the general population risk, simply due to the family history of cancer.  We recommended women in this family have a yearly mammogram beginning at age 35, or 52 years younger than the earliest onset of  cancer, an annual clinical breast exam, and perform monthly breast self-exams. Women in this family should also have a gynecological exam as recommended by their primary provider. All family members should have a colonoscopy by age 73 (or as directed by their doctors).  All family members should inform their physicians about the family history of cancer so their doctors can make the most appropriate screening recommendations for them.   It is also possible there is a hereditary cause for the cancer in Ms. Landgrebe's family that she did not inherit and therefore was not identified in her.  Therefore, we recommended sister who was dx with breast cancer at 83 also have genetic counseling and testing. Ms. Yebra will let us know if we can be of any assistance in coordinating genetic counseling and/or testing for these family members.   FOLLOW-UP: Lastly, we discussed with Ms. Shumard that cancer genetics is a rapidly advancing field and it is possible that new genetic tests will be appropriate for her and/or her family members in the future. We encouraged her to  remain in contact with cancer genetics on an annual basis so we can update her personal and family histories and let her know of advances in cancer genetics that may benefit this family.   Our contact number was provided. Ms. Shell questions were answered to her satisfaction, and she knows she is welcome to call us at anytime with additional questions or concerns.   Ferol Luz, MS, [facility] Certified Genetic Counselor [email or web address]

## 2018-06-22 ENCOUNTER — Other Ambulatory Visit: Payer: Self-pay | Admitting: Internal Medicine

## 2018-08-29 ENCOUNTER — Other Ambulatory Visit: Payer: Self-pay | Admitting: Internal Medicine

## 2018-11-28 ENCOUNTER — Other Ambulatory Visit: Payer: Self-pay

## 2018-11-28 MED ORDER — SIMVASTATIN 10 MG PO TABS: 10.0000 mg | ORAL_TABLET | Freq: Every day | ORAL | 3 refills | Status: DC

## 2018-12-24 ENCOUNTER — Other Ambulatory Visit: Payer: Self-pay | Admitting: Internal Medicine

## 2019-01-14 ENCOUNTER — Other Ambulatory Visit: Payer: Self-pay | Admitting: Internal Medicine

## 2019-01-14 DIAGNOSIS — Z1231 Encounter for screening mammogram for malignant neoplasm of breast: Secondary | ICD-10-CM

## 2019-01-20 ENCOUNTER — Other Ambulatory Visit: Payer: Self-pay | Admitting: Internal Medicine

## 2019-02-11 ENCOUNTER — Other Ambulatory Visit: Payer: Self-pay | Admitting: Internal Medicine

## 2019-02-20 ENCOUNTER — Other Ambulatory Visit: Payer: Medicare Other | Admitting: Internal Medicine

## 2019-02-20 ENCOUNTER — Other Ambulatory Visit: Payer: Self-pay

## 2019-02-20 DIAGNOSIS — E8881 Metabolic syndrome: Secondary | ICD-10-CM

## 2019-02-20 DIAGNOSIS — E785 Hyperlipidemia, unspecified: Secondary | ICD-10-CM

## 2019-02-20 DIAGNOSIS — I10 Essential (primary) hypertension: Secondary | ICD-10-CM

## 2019-02-20 DIAGNOSIS — D649 Anemia, unspecified: Secondary | ICD-10-CM

## 2019-02-20 DIAGNOSIS — Z0000 Encounter for general adult medical examination without abnormal findings: Secondary | ICD-10-CM

## 2019-02-20 DIAGNOSIS — E119 Type 2 diabetes mellitus without complications: Secondary | ICD-10-CM | POA: Diagnosis not present

## 2019-02-20 DIAGNOSIS — D509 Iron deficiency anemia, unspecified: Secondary | ICD-10-CM

## 2019-02-20 DIAGNOSIS — E039 Hypothyroidism, unspecified: Secondary | ICD-10-CM

## 2019-02-21 LAB — MICROALBUMIN / CREATININE URINE RATIO
Creatinine, Urine: 115 mg/dL (ref 20–275)
Microalb Creat Ratio: 4 mcg/mg creat (ref <30)
Microalb, Ur: 0.5 mg/dL

## 2019-02-21 LAB — CBC WITH DIFFERENTIAL/PLATELET
Absolute Monocytes: 756 cells/uL (ref 200–950)
Basophils Absolute: 50 cells/uL (ref 0–200)
Basophils Relative: 0.6 %
Eosinophils Absolute: 244 cells/uL (ref 15–500)
Eosinophils Relative: 2.9 %
HCT: 34.4 % — ABNORMAL LOW (ref 35.0–45.0)
Hemoglobin: 11 g/dL — ABNORMAL LOW (ref 11.7–15.5)
Lymphs Abs: 1411 cells/uL (ref 850–3900)
MCH: 27.2 pg (ref 27.0–33.0)
MCHC: 32 g/dL (ref 32.0–36.0)
MCV: 85.1 fL (ref 80.0–100.0)
MPV: 13.1 fL — ABNORMAL HIGH (ref 7.5–12.5)
Monocytes Relative: 9 %
Neutro Abs: 5939 cells/uL (ref 1500–7800)
Neutrophils Relative %: 70.7 %
Platelets: 278 10*3/uL (ref 140–400)
RBC: 4.04 10*6/uL (ref 3.80–5.10)
RDW: 15.6 % — ABNORMAL HIGH (ref 11.0–15.0)
Total Lymphocyte: 16.8 %
WBC: 8.4 10*3/uL (ref 3.8–10.8)

## 2019-02-21 LAB — COMPLETE METABOLIC PANEL WITH GFR
AG Ratio: 1.4 (calc) (ref 1.0–2.5)
ALT: 9 U/L (ref 6–29)
AST: 17 U/L (ref 10–35)
Albumin: 4.2 g/dL (ref 3.6–5.1)
Alkaline phosphatase (APISO): 87 U/L (ref 37–153)
BUN: 14 mg/dL (ref 7–25)
CO2: 29 mmol/L (ref 20–32)
Calcium: 9.7 mg/dL (ref 8.6–10.4)
Chloride: 103 mmol/L (ref 98–110)
Creat: 0.81 mg/dL (ref 0.60–0.93)
GFR, Est African American: 84 mL/min/{1.73_m2} (ref >=60)
GFR, Est Non African American: 73 mL/min/{1.73_m2} (ref >=60)
Globulin: 3 g/dL (calc) (ref 1.9–3.7)
Glucose, Bld: 87 mg/dL (ref 65–99)
Potassium: 5.5 mmol/L — ABNORMAL HIGH (ref 3.5–5.3)
Sodium: 140 mmol/L (ref 135–146)
Total Bilirubin: 0.3 mg/dL (ref 0.2–1.2)
Total Protein: 7.2 g/dL (ref 6.1–8.1)

## 2019-02-21 LAB — LIPID PANEL
Cholesterol: 165 mg/dL (ref <200)
HDL: 76 mg/dL (ref >=50)
LDL Cholesterol (Calc): 75 mg/dL (calc)
Non-HDL Cholesterol (Calc): 89 mg/dL (calc) (ref <130)
Total CHOL/HDL Ratio: 2.2 (calc) (ref <5.0)
Triglycerides: 65 mg/dL (ref <150)

## 2019-02-21 LAB — HEMOGLOBIN A1C
Hgb A1c MFr Bld: 5.9 % of total Hgb — ABNORMAL HIGH (ref <5.7)
Mean Plasma Glucose: 123 (calc)
eAG (mmol/L): 6.8 (calc)

## 2019-02-21 LAB — TSH: TSH: 3.47 mIU/L (ref 0.40–4.50)

## 2019-02-24 ENCOUNTER — Other Ambulatory Visit: Payer: Self-pay

## 2019-02-24 ENCOUNTER — Ambulatory Visit (INDEPENDENT_AMBULATORY_CARE_PROVIDER_SITE_OTHER): Payer: Medicare Other | Admitting: Internal Medicine

## 2019-02-24 ENCOUNTER — Encounter: Payer: Self-pay | Admitting: Internal Medicine

## 2019-02-24 VITALS — BP 150/90 | HR 64 | Temp 98.0°F | Ht 64.0 in | Wt 170.0 lb

## 2019-02-24 DIAGNOSIS — Z853 Personal history of malignant neoplasm of breast: Secondary | ICD-10-CM

## 2019-02-24 DIAGNOSIS — I10 Essential (primary) hypertension: Secondary | ICD-10-CM

## 2019-02-24 DIAGNOSIS — Z0000 Encounter for general adult medical examination without abnormal findings: Secondary | ICD-10-CM

## 2019-02-24 DIAGNOSIS — E039 Hypothyroidism, unspecified: Secondary | ICD-10-CM

## 2019-02-24 DIAGNOSIS — E1169 Type 2 diabetes mellitus with other specified complication: Secondary | ICD-10-CM

## 2019-02-24 DIAGNOSIS — E8881 Metabolic syndrome: Secondary | ICD-10-CM | POA: Diagnosis not present

## 2019-02-24 DIAGNOSIS — E785 Hyperlipidemia, unspecified: Secondary | ICD-10-CM

## 2019-02-24 DIAGNOSIS — Z862 Personal history of diseases of the blood and blood-forming organs and certain disorders involving the immune mechanism: Secondary | ICD-10-CM

## 2019-02-24 LAB — POCT URINALYSIS DIPSTICK
Appearance: NEGATIVE
Bilirubin, UA: NEGATIVE
Blood, UA: NEGATIVE
Glucose, UA: NEGATIVE
Ketones, UA: NEGATIVE
Leukocytes, UA: NEGATIVE
Nitrite, UA: NEGATIVE
Odor: NEGATIVE
Protein, UA: NEGATIVE
Spec Grav, UA: 1.01 (ref 1.010–1.025)
Urobilinogen, UA: 0.2 E.U./dL
pH, UA: 6.5 (ref 5.0–8.0)

## 2019-02-26 ENCOUNTER — Other Ambulatory Visit: Payer: Self-pay

## 2019-02-26 ENCOUNTER — Ambulatory Visit
Admission: RE | Admit: 2019-02-26 | Discharge: 2019-02-26 | Disposition: A | Discharge status: Home / Self Care | Payer: Medicare Other | Source: Ambulatory Visit | Attending: Internal Medicine | Admitting: Internal Medicine

## 2019-02-26 DIAGNOSIS — Z1231 Encounter for screening mammogram for malignant neoplasm of breast: Secondary | ICD-10-CM

## 2019-03-15 NOTE — Patient Instructions (Signed)
It was a pleasure to see you today.  Continue current medications and follow-up in 6 months.  No change in medical regimen.  Try to get some exercise.

## 2019-03-15 NOTE — Progress Notes (Signed)
Subjective:    Patient ID: Victoria Hess, female    DOB: 1945-11-24, 72 y.o.   MRN: [id number]  HPI 73 year old Female in today for Medicare wellness, routine health maintenance and evaluation of multiple medical issues.  History of hypertension, hypothyroidism, controlled type 2 diabetes mellitus, obesity, hyperlipidemia, metabolic syndrome and history of breast cancer.  History of bilateral ductal carcinoma in situ.  Had wide excision procedure of the left breast in 2011 and a lesser procedure done on her right breast in 2002.  Has done well since that time.  Tonsillectomy and adenoidectomy 1955.  Appendectomy and tubal ligation 1977.  History of uterine fibroids, cystocele, uterine prolapse.  Social history: Works with special needs students.  Lives in [place].  Has been in good health.  Does not smoke or consume alcohol.  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  Family history: Father died at age 83 of a stroke.  Mother died from coronary disease.  1 brother with history of hypertension and diabetes.  1 sister with hypertension and diabetes.  Another sister with hypertension.  1 sister in good health without medical issues.    Review of Systems  Constitutional: Negative.   All other systems reviewed and are negative.      Objective:   Physical Exam Vitals signs reviewed.  Constitutional:      General: She is not in acute distress.    Appearance: Normal appearance. She is not toxic-appearing or diaphoretic.  HENT:     Head: Normocephalic and atraumatic.     Right Ear: Tympanic membrane and external ear normal.     Left Ear: Tympanic membrane and external ear normal.     Nose: Nose normal.     Mouth/Throat:     Mouth: Mucous membranes are moist.     Pharynx: Oropharynx is clear.  Eyes:     General: No scleral icterus.       Right eye: No discharge.        Left eye: No discharge.     Extraocular Movements: Extraocular movements intact.   Conjunctiva/sclera: Conjunctivae normal.     Pupils: Pupils are equal, round, and reactive to light.  Neck:     Musculoskeletal: Neck supple. No neck rigidity.     Vascular: No carotid bruit.  Cardiovascular:     Rate and Rhythm: Normal rate and regular rhythm.     Pulses: Normal pulses.     Heart sounds: Normal heart sounds. No murmur.  Pulmonary:     Effort: No respiratory distress.     Breath sounds: Normal breath sounds. No wheezing or rales.  Abdominal:     General: Bowel sounds are normal.     Palpations: Abdomen is soft. There is no mass.     Tenderness: There is no guarding or rebound.  Genitourinary:    Comments: Deferred Musculoskeletal:     Right lower leg: No edema.     Left lower leg: No edema.  Lymphadenopathy:     Cervical: No cervical adenopathy.  Skin:    General: Skin is warm and dry.     Findings: No lesion or rash.  Neurological:     General: No focal deficit present.     Mental Status: She is alert and oriented to person, place, and time.     Cranial Nerves: No cranial nerve deficit.     Sensory: No sensory deficit.     Motor: No weakness.     Coordination: Coordination normal.  Psychiatric:        Mood and Affect: Mood normal.        Behavior: Behavior normal.        Thought Content: Thought content normal.        Judgment: Judgment normal.    Blood pressure 150/90 pulse 64 weight 170 pounds BMI 29.18       Assessment & Plan:  Essential hypertension-has labile element.  She will continue to monitor.  She is on multidrug regimen.  This consist of furosemide, Bystolic, and amlodipine  Hyperlipidemia-lipid panel and liver functions are normal.  She is on statin therapy consisting of simvastatin 10 mg at bedtime.  Elevated serum potassium thought to be due to lab error and will not be repeated  Hypothyroidism treated with thyroid replacement medication and TSH is stable  Remote history of bilateral breast cancer without recurrence- she was  referred here in 2012 by Dr. Jana Hakim  Impaired glucose tolerance-stable  History of iron deficiency.  In January her iron level was low at 35.  She now is on an iron supplement and her hemoglobin is stable at 11 g and has been as low as 9.8 g 11 months ago with an MCV of 79.9.  She had colonoscopy in 2018 showing only diverticulosis  Plan: Continue current medications and follow-up in 6 months.  Declines flu vaccine.  Subjective:   Patient presents for Medicare Annual/Subsequent preventive examination.  Review Past Medical/Family/Social: See above   Risk Factors  Current exercise habits: Tries to walk some Dietary issues discussed: Low-fat low carbohydrate  Cardiac risk factors: Impaired glucose tolerance, family history of stroke in father and mother with heart disease  Depression Screen  (Note: if answer to either of the following is "Yes", a more complete depression screening is indicated)   Over the past two weeks, have you felt down, depressed or hopeless? No  Over the past two weeks, have you felt little interest or pleasure in doing things? No Have you lost interest or pleasure in daily life? No Do you often feel hopeless? No Do you cry easily over simple problems? No   Activities of Daily Living  In your present state of health, do you have any difficulty performing the following activities?:   Driving? No  Managing money? No  Feeding yourself? No  Getting from bed to chair? No  Climbing a flight of stairs? No  Preparing food and eating?: No  Bathing or showering? No  Getting dressed: No  Getting to the toilet? No  Using the toilet:No  Moving around from place to place: No  In the past year have you fallen or had a near fall?:No  Are you sexually active? No  Do you have more than one partner? No   Hearing Difficulties: No  Do you often ask people to speak up or repeat themselves? No  Do you experience ringing or noises in your ears?  Sometimes Do you have  difficulty understanding soft or whispered voices? No  Do you feel that you have a problem with memory? No Do you often misplace items? No    Home Safety:  Do you have a smoke alarm at your residence? Yes Do you have grab bars in the bathroom?  None Do you have throw rugs in your house?  Yes   Cognitive Testing  Alert? Yes Normal Appearance?Yes  Oriented to person? Yes Place? Yes  Time? Yes  Recall of three objects? Yes  Can perform simple calculations? Yes  Displays  appropriate judgment?Yes  Can read the correct time from a watch face?Yes   List the Names of Other Physician/Practitioners you currently use:  See referral list for the physicians patient is currently seeing.     Review of Systems: See above   Objective:     General appearance: Appears stated age  Head: Normocephalic, without obvious abnormality, atraumatic  Eyes: conj clear, EOMi PEERLA  Ears: normal TM's and external ear canals both ears  Nose: Nares normal. Septum midline. Mucosa normal. No drainage or sinus tenderness.  Throat: lips, mucosa, and tongue normal; teeth and gums normal  Neck: no adenopathy, no carotid bruit, no JVD, supple, symmetrical, trachea midline and thyroid not enlarged, symmetric, no tenderness/mass/nodules  No CVA tenderness.  Lungs: clear to auscultation bilaterally  Breasts: normal appearance, no masses or tenderness Heart: regular rate and rhythm, S1, S2 normal, no murmur, click, rub or gallop  Abdomen: soft, non-tender; bowel sounds normal; no masses, no organomegaly  Musculoskeletal: ROM normal in all joints, no crepitus, no deformity, Normal muscle strengthen. Back  is symmetric, no curvature. Skin: Skin color, texture, turgor normal. No rashes or lesions  Lymph nodes: Cervical, supraclavicular, and axillary nodes normal.  Neurologic: CN 2 -12 Normal, Normal symmetric reflexes. Normal coordination and gait  Psych: Alert & Oriented x 3, Mood appear stable.    Assessment:     Annual wellness medicare exam   Plan:    During the course of the visit the patient was educated and counseled about appropriate screening and preventive services including:        Patient Instructions (the written plan) was given to the patient.  Medicare Attestation  I have personally reviewed:  The patient's medical and social history  Their use of alcohol, tobacco or illicit drugs  Their current medications and supplements  The patient's functional ability including ADLs,fall risks, home safety risks, cognitive, and hearing and visual impairment  Diet and physical activities  Evidence for depression or mood disorders  The patient's weight, height, BMI, and visual acuity have been recorded in the chart. I have made referrals, counseling, and provided education to the patient based on review of the above and I have provided the patient with a written personalized care plan for preventive services.      Impaired glucose tolerance-hemoglobin A1c 5.9% and previously was 6.1% in October 2019.  Patient is on Metformin

## 2019-05-26 ENCOUNTER — Telehealth: Payer: Self-pay | Admitting: Internal Medicine

## 2019-05-26 NOTE — Telephone Encounter (Signed)
Received Fax RX request from  [facility]  Medication - Meloxicam 15 mg  Last Refill - 04/25/19  Last OV - 02/24/19  Last CPE - 05/04/19  Next Appointment - 08/25/19

## 2019-06-23 ENCOUNTER — Other Ambulatory Visit: Payer: Self-pay

## 2019-06-23 ENCOUNTER — Ambulatory Visit: Payer: Medicare Other | Attending: Internal Medicine

## 2019-06-23 DIAGNOSIS — Z23 Encounter for immunization: Secondary | ICD-10-CM | POA: Insufficient documentation

## 2019-06-23 NOTE — Progress Notes (Signed)
   Covid-19 Vaccination Clinic  Name:  Victoria Hess    MRN: [id number] DOB: 08-24-1945  06/23/2019  [name] was observed post Covid-19 immunization for 15 minutes without incidence. She was provided with Vaccine Information Sheet and instruction to access the V-Safe system.   [name] was instructed to call 911 with any severe reactions post vaccine: Marland Kitchen Difficulty breathing  . Swelling of your face and throat  . A fast heartbeat  . A bad rash all over your body  . Dizziness and weakness    Immunizations Administered    Name Date Dose VIS Date Route   Moderna COVID-19 Vaccine 06/23/2019  2:05 PM 0.5 mL 04/15/2019 Intramuscular   Manufacturer: Moderna   Lot: [id number]   [id number]

## 2019-07-23 ENCOUNTER — Ambulatory Visit: Payer: Medicare Other | Attending: Internal Medicine

## 2019-07-23 DIAGNOSIS — Z23 Encounter for immunization: Secondary | ICD-10-CM | POA: Insufficient documentation

## 2019-07-23 NOTE — Progress Notes (Signed)
   Covid-19 Vaccination Clinic  Name:  Victoria Hess    MRN: [id number] DOB: [birth year]  07/23/2019  [name] was observed post Covid-19 immunization for 15 minutes without incident. She was provided with Vaccine Information Sheet and instruction to access the V-Safe system.   [name] was instructed to call 911 with any severe reactions post vaccine: Marland Kitchen Difficulty breathing  . Swelling of face and throat  . A fast heartbeat  . A bad rash all over body  . Dizziness and weakness   Immunizations Administered    Name Date Dose VIS Date Route   Moderna COVID-19 Vaccine 07/23/2019 10:00 AM 0.5 mL 04/15/2019 Intramuscular   Manufacturer: Moderna   Lot: [id number]   [id number]

## 2019-07-27 ENCOUNTER — Other Ambulatory Visit: Payer: Self-pay | Admitting: Internal Medicine

## 2019-08-22 ENCOUNTER — Other Ambulatory Visit: Payer: Medicare Other | Admitting: Internal Medicine

## 2019-08-22 ENCOUNTER — Other Ambulatory Visit: Payer: Self-pay

## 2019-08-22 DIAGNOSIS — D509 Iron deficiency anemia, unspecified: Secondary | ICD-10-CM | POA: Diagnosis not present

## 2019-08-22 DIAGNOSIS — E039 Hypothyroidism, unspecified: Secondary | ICD-10-CM | POA: Diagnosis not present

## 2019-08-22 DIAGNOSIS — E119 Type 2 diabetes mellitus without complications: Secondary | ICD-10-CM

## 2019-08-23 LAB — HEPATIC FUNCTION PANEL
AG Ratio: 1.6 (calc) (ref 1.0–2.5)
ALT: 12 U/L (ref 6–29)
AST: 17 U/L (ref 10–35)
Albumin: 4.2 g/dL (ref 3.6–5.1)
Alkaline phosphatase (APISO): 73 U/L (ref 37–153)
Bilirubin, Direct: 0.1 mg/dL (ref 0.0–0.2)
Globulin: 2.7 g/dL (calc) (ref 1.9–3.7)
Indirect Bilirubin: 0.1 mg/dL (calc) — ABNORMAL LOW (ref 0.2–1.2)
Total Bilirubin: 0.2 mg/dL (ref 0.2–1.2)
Total Protein: 6.9 g/dL (ref 6.1–8.1)

## 2019-08-23 LAB — LIPID PANEL
Cholesterol: 165 mg/dL (ref <200)
HDL: 81 mg/dL (ref >=50)
LDL Cholesterol (Calc): 69 mg/dL (calc)
Non-HDL Cholesterol (Calc): 84 mg/dL (calc) (ref <130)
Total CHOL/HDL Ratio: 2 (calc) (ref <5.0)
Triglycerides: 71 mg/dL (ref <150)

## 2019-08-23 LAB — HEMOGLOBIN A1C
Hgb A1c MFr Bld: 5.9 % of total Hgb — ABNORMAL HIGH (ref <5.7)
Mean Plasma Glucose: 123 (calc)
eAG (mmol/L): 6.8 (calc)

## 2019-08-23 LAB — TSH: TSH: 2.15 mIU/L (ref 0.40–4.50)

## 2019-08-25 ENCOUNTER — Ambulatory Visit (INDEPENDENT_AMBULATORY_CARE_PROVIDER_SITE_OTHER): Payer: Medicare Other | Admitting: Internal Medicine

## 2019-08-25 ENCOUNTER — Encounter: Payer: Self-pay | Admitting: Internal Medicine

## 2019-08-25 ENCOUNTER — Other Ambulatory Visit: Payer: Self-pay

## 2019-08-25 VITALS — BP 120/80 | HR 67 | Temp 98.5°F | Ht 64.0 in | Wt 176.0 lb

## 2019-08-25 DIAGNOSIS — E8881 Metabolic syndrome: Secondary | ICD-10-CM

## 2019-08-25 DIAGNOSIS — Z853 Personal history of malignant neoplasm of breast: Secondary | ICD-10-CM | POA: Diagnosis not present

## 2019-08-25 DIAGNOSIS — I10 Essential (primary) hypertension: Secondary | ICD-10-CM | POA: Diagnosis not present

## 2019-08-25 DIAGNOSIS — E785 Hyperlipidemia, unspecified: Secondary | ICD-10-CM

## 2019-08-25 DIAGNOSIS — Z6830 Body mass index (BMI) 30.0-30.9, adult: Secondary | ICD-10-CM

## 2019-08-25 DIAGNOSIS — E1169 Type 2 diabetes mellitus with other specified complication: Secondary | ICD-10-CM

## 2019-08-25 DIAGNOSIS — E039 Hypothyroidism, unspecified: Secondary | ICD-10-CM | POA: Diagnosis not present

## 2019-08-25 MED ORDER — AMLODIPINE BESYLATE 10 MG PO TABS: 10.0000 mg | ORAL_TABLET | Freq: Every day | ORAL | 1 refills | Status: DC

## 2019-08-25 MED ORDER — LEVOTHYROXINE SODIUM 50 MCG PO TABS: 50.0000 ug | ORAL_TABLET | Freq: Every day | ORAL | 3 refills | Status: DC

## 2019-08-25 MED ORDER — METFORMIN HCL 500 MG PO TABS: ORAL_TABLET | ORAL | 3 refills | Status: DC

## 2019-08-25 MED ORDER — SIMVASTATIN 10 MG PO TABS: 10.0000 mg | ORAL_TABLET | Freq: Every day | ORAL | 3 refills | Status: DC

## 2019-08-25 MED ORDER — FUROSEMIDE 40 MG PO TABS: 40.0000 mg | ORAL_TABLET | Freq: Every day | ORAL | 3 refills | Status: DC

## 2019-08-25 NOTE — Progress Notes (Signed)
   Subjective:    Patient ID: Victoria Hess, female    DOB: 1946-02-16, 74 y.o.   MRN: [id number]  HPI 74 year old Female for 6 month recheck. History of HTN, impaired glucose tolerance,hypothyroidism,hyperlipidemia, obesity, metabolic syndrome with history of breast cancer.  History of bilateral ductal carcinoma in situ and is doing well since surgery.  Had wide excision procedure of left breast in 2011 with a laser procedure done of right breast in 2002.  Currently not working.  School has been out due to COVID-19.  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  She does not smoke or consume alcohol.    Review of Systems Had colonoscopy 2018 with 10 year follow up     Objective:   Physical Exam Blood pressure 120/80 pulse 67 temperature 98.5 degrees orally pulse oximetry 98% weight 176 pounds BMI 30.21  Skin warm and dry. No cervical adenopathy.  No thyromegaly.  No carotid bruits.  Chest clear to auscultation.  Cardiac exam regular rate and rhythm normal S1 and S2.  Extremities without edema.       Assessment & Plan:  Impaired glucose tolerance-hemoglobin A1c excellent and stable at 5.9% on Metformin  Essential hypertension stable on amlodipine, Bystolic, furosemide.  History of hypokalemia treated with potassium supplement  Hypothyroidism-stable TSH on thyroid replacement medication  Hyperlipidemia-lipid panel and liver functions are normal on statin medication  Plan: I am very pleased with her results today.  Her blood pressure is excellent.  She feels well and has no new complaints.  She will return in 6 months for health maintenance exam and continue current medications.

## 2019-09-07 NOTE — Patient Instructions (Signed)
Continue to work on diet exercise and weight loss.  Medical issues are stable at present time with current medications.  Follow-up in 6 months for health maintenance exam and Medicare wellness visit.  It was a pleasure to see you today.

## 2019-11-07 DIAGNOSIS — H25813 Combined forms of age-related cataract, bilateral: Secondary | ICD-10-CM | POA: Diagnosis not present

## 2019-11-07 DIAGNOSIS — E089 Diabetes mellitus due to underlying condition without complications: Secondary | ICD-10-CM | POA: Diagnosis not present

## 2019-11-07 DIAGNOSIS — H524 Presbyopia: Secondary | ICD-10-CM | POA: Diagnosis not present

## 2019-11-07 DIAGNOSIS — H43813 Vitreous degeneration, bilateral: Secondary | ICD-10-CM | POA: Diagnosis not present

## 2019-11-07 DIAGNOSIS — E119 Type 2 diabetes mellitus without complications: Secondary | ICD-10-CM | POA: Diagnosis not present

## 2019-11-07 LAB — HM DIABETES EYE EXAM

## 2019-11-10 ENCOUNTER — Encounter: Payer: Self-pay | Admitting: Internal Medicine

## 2020-01-16 ENCOUNTER — Other Ambulatory Visit: Payer: Self-pay | Admitting: Internal Medicine

## 2020-01-16 DIAGNOSIS — Z1231 Encounter for screening mammogram for malignant neoplasm of breast: Secondary | ICD-10-CM

## 2020-01-17 ENCOUNTER — Other Ambulatory Visit: Payer: Self-pay | Admitting: Internal Medicine

## 2020-02-15 ENCOUNTER — Other Ambulatory Visit: Payer: Self-pay | Admitting: Internal Medicine

## 2020-02-24 ENCOUNTER — Other Ambulatory Visit: Payer: Medicare Other | Admitting: Internal Medicine

## 2020-02-24 ENCOUNTER — Other Ambulatory Visit: Payer: Self-pay

## 2020-02-24 DIAGNOSIS — E8881 Metabolic syndrome: Secondary | ICD-10-CM

## 2020-02-24 DIAGNOSIS — E1169 Type 2 diabetes mellitus with other specified complication: Secondary | ICD-10-CM | POA: Diagnosis not present

## 2020-02-24 DIAGNOSIS — I10 Essential (primary) hypertension: Secondary | ICD-10-CM

## 2020-02-24 DIAGNOSIS — Z853 Personal history of malignant neoplasm of breast: Secondary | ICD-10-CM

## 2020-02-24 DIAGNOSIS — Z0000 Encounter for general adult medical examination without abnormal findings: Secondary | ICD-10-CM

## 2020-02-24 DIAGNOSIS — E039 Hypothyroidism, unspecified: Secondary | ICD-10-CM

## 2020-02-24 DIAGNOSIS — E785 Hyperlipidemia, unspecified: Secondary | ICD-10-CM | POA: Diagnosis not present

## 2020-02-25 LAB — CBC WITH DIFFERENTIAL/PLATELET
Absolute Monocytes: 662 cells/uL (ref 200–950)
Basophils Absolute: 46 cells/uL (ref 0–200)
Basophils Relative: 0.6 %
Eosinophils Absolute: 162 cells/uL (ref 15–500)
Eosinophils Relative: 2.1 %
HCT: 35.6 % (ref 35.0–45.0)
Hemoglobin: 11.7 g/dL (ref 11.7–15.5)
Lymphs Abs: 1656 cells/uL (ref 850–3900)
MCH: 27.7 pg (ref 27.0–33.0)
MCHC: 32.9 g/dL (ref 32.0–36.0)
MCV: 84.4 fL (ref 80.0–100.0)
MPV: 12.6 fL — ABNORMAL HIGH (ref 7.5–12.5)
Monocytes Relative: 8.6 %
Neutro Abs: 5174 cells/uL (ref 1500–7800)
Neutrophils Relative %: 67.2 %
Platelets: 252 10*3/uL (ref 140–400)
RBC: 4.22 10*6/uL (ref 3.80–5.10)
RDW: 14.4 % (ref 11.0–15.0)
Total Lymphocyte: 21.5 %
WBC: 7.7 10*3/uL (ref 3.8–10.8)

## 2020-02-25 LAB — COMPLETE METABOLIC PANEL WITH GFR
AG Ratio: 1.6 (calc) (ref 1.0–2.5)
ALT: 9 U/L (ref 6–29)
AST: 15 U/L (ref 10–35)
Albumin: 4.5 g/dL (ref 3.6–5.1)
Alkaline phosphatase (APISO): 89 U/L (ref 37–153)
BUN: 19 mg/dL (ref 7–25)
CO2: 28 mmol/L (ref 20–32)
Calcium: 10.3 mg/dL (ref 8.6–10.4)
Chloride: 103 mmol/L (ref 98–110)
Creat: 0.89 mg/dL (ref 0.60–0.93)
GFR, Est African American: 75 mL/min/{1.73_m2} (ref >=60)
GFR, Est Non African American: 64 mL/min/{1.73_m2} (ref >=60)
Globulin: 2.9 g/dL (calc) (ref 1.9–3.7)
Glucose, Bld: 97 mg/dL (ref 65–99)
Potassium: 5.3 mmol/L (ref 3.5–5.3)
Sodium: 140 mmol/L (ref 135–146)
Total Bilirubin: 0.3 mg/dL (ref 0.2–1.2)
Total Protein: 7.4 g/dL (ref 6.1–8.1)

## 2020-02-25 LAB — LIPID PANEL
Cholesterol: 190 mg/dL (ref <200)
HDL: 88 mg/dL (ref >=50)
LDL Cholesterol (Calc): 85 mg/dL (calc)
Non-HDL Cholesterol (Calc): 102 mg/dL (calc) (ref <130)
Total CHOL/HDL Ratio: 2.2 (calc) (ref <5.0)
Triglycerides: 79 mg/dL (ref <150)

## 2020-02-25 LAB — HEMOGLOBIN A1C
Hgb A1c MFr Bld: 5.9 % of total Hgb — ABNORMAL HIGH (ref <5.7)
Mean Plasma Glucose: 123 (calc)
eAG (mmol/L): 6.8 (calc)

## 2020-02-25 LAB — MICROALBUMIN / CREATININE URINE RATIO
Creatinine, Urine: 71 mg/dL (ref 20–275)
Microalb Creat Ratio: 4 mcg/mg creat (ref <30)
Microalb, Ur: 0.3 mg/dL

## 2020-02-25 LAB — TSH: TSH: 2.55 mIU/L (ref 0.40–4.50)

## 2020-02-26 ENCOUNTER — Encounter: Payer: Self-pay | Admitting: Internal Medicine

## 2020-02-26 ENCOUNTER — Other Ambulatory Visit: Payer: Self-pay

## 2020-02-26 ENCOUNTER — Ambulatory Visit (INDEPENDENT_AMBULATORY_CARE_PROVIDER_SITE_OTHER): Payer: Medicare Other | Admitting: Internal Medicine

## 2020-02-26 VITALS — BP 120/80 | Ht 64.0 in | Wt 174.0 lb

## 2020-02-26 DIAGNOSIS — R002 Palpitations: Secondary | ICD-10-CM | POA: Diagnosis not present

## 2020-02-26 DIAGNOSIS — E1169 Type 2 diabetes mellitus with other specified complication: Secondary | ICD-10-CM

## 2020-02-26 DIAGNOSIS — E039 Hypothyroidism, unspecified: Secondary | ICD-10-CM | POA: Diagnosis not present

## 2020-02-26 DIAGNOSIS — Z853 Personal history of malignant neoplasm of breast: Secondary | ICD-10-CM | POA: Diagnosis not present

## 2020-02-26 DIAGNOSIS — Z0000 Encounter for general adult medical examination without abnormal findings: Secondary | ICD-10-CM | POA: Diagnosis not present

## 2020-02-26 DIAGNOSIS — E8881 Metabolic syndrome: Secondary | ICD-10-CM | POA: Diagnosis not present

## 2020-02-26 DIAGNOSIS — I10 Essential (primary) hypertension: Secondary | ICD-10-CM

## 2020-02-26 DIAGNOSIS — Z8262 Family history of osteoporosis: Secondary | ICD-10-CM

## 2020-02-26 DIAGNOSIS — E785 Hyperlipidemia, unspecified: Secondary | ICD-10-CM

## 2020-02-26 DIAGNOSIS — Z6829 Body mass index (BMI) 29.0-29.9, adult: Secondary | ICD-10-CM

## 2020-02-26 LAB — POCT URINALYSIS DIPSTICK
Appearance: NEGATIVE
Bilirubin, UA: NEGATIVE
Blood, UA: NEGATIVE
Glucose, UA: NEGATIVE
Ketones, UA: NEGATIVE
Leukocytes, UA: NEGATIVE
Nitrite, UA: NEGATIVE
Odor: NEGATIVE
Protein, UA: NEGATIVE
Spec Grav, UA: 1.01 (ref 1.010–1.025)
Urobilinogen, UA: 0.2 E.U./dL
pH, UA: 6 (ref 5.0–8.0)

## 2020-02-26 NOTE — Progress Notes (Signed)
Subjective:    Patient ID: Victoria Hess, female    DOB: May 08, 1946, 74 y.o.   MRN: [id number]  HPI 74 year old Black Female for Medicare wellness visit, health maintenance exam and evaluation of medical issues.  She has a history of hypertension, hypothyroidism, controlled type 2 diabetes mellitus, obesity, hyperlipidemia, metabolic syndrome and history of breast cancer.  History of bilateral ductal carcinoma in situ.  She had wide excisions procedure of the left breast in 2011 and a lesser procedure done on her right breast in 2002.  She has done well since that time.  Tonsillectomy and adenoidectomy 1955.  Appendectomy and tubal ligation 1977.  History of uterine fibroids, cystocele, uterine prolapse.  Social history: She lives in [place].  General health has been good.  Does not smoke or consume alcohol.  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  Family history: Father died at age 21 of a stroke.  Mother died from coronary disease.  1 brother with history of hypertension and diabetes.  1 sister with hypertension and diabetes.  Another sister with hypertension.  1 sister in good health without medical issues.    Review of Systems  Constitutional: Negative.   Respiratory: Negative.   Cardiovascular: Negative.   Gastrointestinal: Negative.   Genitourinary: Negative.   Neurological: Negative.   Psychiatric/Behavioral: Negative.        Objective:   Physical Exam Blood pressure 120/80 weight 174 pounds BMI 29.82 height 5 feet 4 inches she is afebrile.  Skin warm and dry.  Nodes none.  TMs are clear.  Neck is supple without JVD thyromegaly or carotid bruits.  Chest is clear to auscultation without rales or wheezing.  Cardiac exam: Regular rate and rhythm normal S1 and S2 without murmurs or gallops.  Abdomen obese soft nondistended without hepatosplenomegaly masses or tenderness.  No lower extremity pitting edema.  Neuro is  intact without focal deficits.       Assessment & Plan:  Essential hypertension-excellent control on current regimen.  She is on multidrug regimen consisting of furosemide, Bystolic, and amlodipine  Hyperlipidemia-  Is on simvastatin 10 mg at bedtime.  Lipid panel is normal.  Palpitations-her friend died recently.  Continue to monitor and call if persistent.  No extrasystoles heard today.  Hypothyroidism-stable with normal TSH on levothyroxine 50 mcg daily  History of bilateral breast cancer-follows with annual mammography  Type 2 diabetes mellitus treated with Metformin and hemoglobin A1c excellent 5.9%  BMI 29.87-continue with diet and exercise efforts  Plan: Continue with current medications and follow-up in 6 months.  Had flu vaccine given today.  Pneumococcal vaccines up-to-date.  Has had 2 maternal vaccines.  Subjective:   Patient presents for Medicare Annual/Subsequent preventive examination.  Review Past Medical/Family/Social: See above   Risk Factors  Current exercise habits: Some light exercise Dietary issues discussed: Yes  Cardiac risk factors: Diabetes and hyperlipidemia  Depression Screen  (Note: if answer to either of the following is "Yes", a more complete depression screening is indicated)   Over the past two weeks, have you felt down, depressed or hopeless?  Yes because a friend died recently Over the past two weeks, have you felt little interest or pleasure in doing things? No Have you lost interest or pleasure in daily life? No Do you often feel hopeless? No Do you cry easily over simple problems? No   Activities of Daily Living  In your present state of health,  do you have any difficulty performing the following activities?:   Driving? No  Managing money? No  Feeding yourself? No  Getting from bed to chair? No  Climbing a flight of stairs? No  Preparing food and eating?: No  Bathing or showering? No  Getting dressed: No  Getting to the  toilet? No  Using the toilet:No  Moving around from place to place: No  In the past year have you fallen or had a near fall?:No  Are you sexually active? No  Do you have more than one partner? No   Hearing Difficulties:  Do you often ask people to speak up or repeat themselves? No  Do you experience ringing or noises in your ears?  Sometimes Do you have difficulty understanding soft or whispered voices? No  Do you feel that you have a problem with memory? No Do you often misplace items? No    Home Safety:  Do you have a smoke alarm at your residence? Yes Do you have grab bars in the bathroom?  No Do you have throw rugs in your house?  Yes   Cognitive Testing  Alert? Yes Normal Appearance?Yes  Oriented to person? Yes Place? Yes  Time? Yes  Recall of three objects? Yes  Can perform simple calculations? Yes  Displays appropriate judgment?Yes  Can read the correct time from a watch face?Yes   List the Names of Other Physician/Practitioners you currently use:  See referral list for the physicians patient is currently seeing.     Review of Systems: See above  Objective:     General appearance: Appears stated age and obese  Head: Normocephalic, without obvious abnormality, atraumatic  Eyes: conj clear, EOMi PEERLA  Ears: normal TM's and external ear canals both ears  Nose: Nares normal. Septum midline. Mucosa normal. No drainage or sinus tenderness.  Throat: lips, mucosa, and tongue normal; teeth and gums normal  Neck: no adenopathy, no carotid bruit, no JVD, supple, symmetrical, trachea midline and thyroid not enlarged, symmetric, no tenderness/mass/nodules  No CVA tenderness.  Lungs: clear to auscultation bilaterally  Breasts: normal appearance, no masses or tenderness Heart: regular rate and rhythm, S1, S2 normal, no murmur, click, rub or gallop  Abdomen: soft, non-tender; bowel sounds normal; no masses, no organomegaly  Musculoskeletal: ROM normal in all joints, no  crepitus, no deformity, Normal muscle strengthen. Back  is symmetric, no curvature. Skin: Skin color, texture, turgor normal. No rashes or lesions  Lymph nodes: Cervical, supraclavicular, and axillary nodes normal.  Neurologic: CN 2 -12 Normal, Normal symmetric reflexes. Normal coordination and gait  Psych: Alert & Oriented x 3, Mood appear stable.    Assessment:    Annual wellness medicare exam   Plan:    During the course of the visit the patient was educated and counseled about appropriate screening and preventive services including:   Annual mammogram  Colonoscopy is up-to-date   Patient Instructions (the written plan) was given to the patient.  Medicare Attestation  I have personally reviewed:  The patient's medical and social history  Their use of alcohol, tobacco or illicit drugs  Their current medications and supplements  The patient's functional ability including ADLs,fall risks, home safety risks, cognitive, and hearing and visual impairment  Diet and physical activities  Evidence for depression or mood disorders  The patient's weight, height, BMI, and visual acuity have been recorded in the chart. I have made referrals, counseling, and provided education to the patient based on review of the above and  I have provided the patient with a written personalized care plan for preventive services.

## 2020-03-02 ENCOUNTER — Ambulatory Visit
Admission: RE | Admit: 2020-03-02 | Discharge: 2020-03-02 | Disposition: A | Discharge status: Home / Self Care | Payer: Medicare Other | Source: Ambulatory Visit | Attending: Internal Medicine | Admitting: Internal Medicine

## 2020-03-02 ENCOUNTER — Other Ambulatory Visit: Payer: Self-pay

## 2020-03-02 DIAGNOSIS — Z1231 Encounter for screening mammogram for malignant neoplasm of breast: Secondary | ICD-10-CM

## 2020-03-02 HISTORY — DX: Malignant neoplasm of unspecified site of unspecified female breast: C50.919

## 2020-03-14 NOTE — Patient Instructions (Signed)
Received flu vaccine.  It was a pleasure to see you.  Call if palpitations are persistent.  Continue current medications.  Continue to work on diet and exercise.  Return in 6 months.

## 2020-03-26 ENCOUNTER — Other Ambulatory Visit: Payer: Medicare Other

## 2020-03-26 DIAGNOSIS — Z20822 Contact with and (suspected) exposure to covid-19: Secondary | ICD-10-CM | POA: Diagnosis not present

## 2020-03-27 LAB — NOVEL CORONAVIRUS, NAA: SARS-CoV-2, NAA: NOT DETECTED

## 2020-03-27 LAB — SARS-COV-2, NAA 2 DAY TAT

## 2020-04-26 NOTE — Telephone Encounter (Signed)
This encounter was created in error - please disregard.

## 2020-05-11 ENCOUNTER — Other Ambulatory Visit: Payer: Self-pay | Admitting: Internal Medicine

## 2020-05-11 DIAGNOSIS — Z13820 Encounter for screening for osteoporosis: Secondary | ICD-10-CM

## 2020-07-07 ENCOUNTER — Other Ambulatory Visit: Payer: Self-pay | Admitting: Internal Medicine

## 2020-08-25 ENCOUNTER — Other Ambulatory Visit: Payer: Medicare Other

## 2020-08-30 ENCOUNTER — Other Ambulatory Visit: Payer: Medicare Other | Admitting: Internal Medicine

## 2020-08-31 ENCOUNTER — Other Ambulatory Visit: Payer: Self-pay

## 2020-08-31 ENCOUNTER — Other Ambulatory Visit: Payer: Medicare Other | Admitting: Internal Medicine

## 2020-08-31 DIAGNOSIS — R002 Palpitations: Secondary | ICD-10-CM | POA: Diagnosis not present

## 2020-08-31 DIAGNOSIS — Z6830 Body mass index (BMI) 30.0-30.9, adult: Secondary | ICD-10-CM

## 2020-08-31 DIAGNOSIS — Z0000 Encounter for general adult medical examination without abnormal findings: Secondary | ICD-10-CM

## 2020-08-31 DIAGNOSIS — E1169 Type 2 diabetes mellitus with other specified complication: Secondary | ICD-10-CM

## 2020-08-31 DIAGNOSIS — Z8262 Family history of osteoporosis: Secondary | ICD-10-CM

## 2020-08-31 DIAGNOSIS — E8881 Metabolic syndrome: Secondary | ICD-10-CM

## 2020-08-31 DIAGNOSIS — E785 Hyperlipidemia, unspecified: Secondary | ICD-10-CM | POA: Diagnosis not present

## 2020-08-31 DIAGNOSIS — E039 Hypothyroidism, unspecified: Secondary | ICD-10-CM | POA: Diagnosis not present

## 2020-08-31 DIAGNOSIS — I10 Essential (primary) hypertension: Secondary | ICD-10-CM

## 2020-09-01 LAB — HEPATIC FUNCTION PANEL
AG Ratio: 1.7 (calc) (ref 1.0–2.5)
ALT: 13 U/L (ref 6–29)
AST: 18 U/L (ref 10–35)
Albumin: 4.7 g/dL (ref 3.6–5.1)
Alkaline phosphatase (APISO): 92 U/L (ref 37–153)
Bilirubin, Direct: 0.1 mg/dL (ref 0.0–0.2)
Globulin: 2.7 g/dL (calc) (ref 1.9–3.7)
Indirect Bilirubin: 0.3 mg/dL (calc) (ref 0.2–1.2)
Total Bilirubin: 0.4 mg/dL (ref 0.2–1.2)
Total Protein: 7.4 g/dL (ref 6.1–8.1)

## 2020-09-01 LAB — LIPID PANEL
Cholesterol: 200 mg/dL — ABNORMAL HIGH (ref <200)
HDL: 97 mg/dL (ref >=50)
LDL Cholesterol (Calc): 86 mg/dL (calc)
Non-HDL Cholesterol (Calc): 103 mg/dL (calc) (ref <130)
Total CHOL/HDL Ratio: 2.1 (calc) (ref <5.0)
Triglycerides: 82 mg/dL (ref <150)

## 2020-09-01 LAB — HEMOGLOBIN A1C
Hgb A1c MFr Bld: 6.1 % of total Hgb — ABNORMAL HIGH (ref <5.7)
Mean Plasma Glucose: 128 mg/dL
eAG (mmol/L): 7.1 mmol/L

## 2020-09-01 LAB — TSH: TSH: 3.27 mIU/L (ref 0.40–4.50)

## 2020-09-03 ENCOUNTER — Other Ambulatory Visit: Payer: Self-pay

## 2020-09-03 ENCOUNTER — Ambulatory Visit
Admission: RE | Admit: 2020-09-03 | Discharge: 2020-09-03 | Disposition: A | Discharge status: Home / Self Care | Payer: Medicare Other | Source: Ambulatory Visit | Attending: Internal Medicine | Admitting: Internal Medicine

## 2020-09-03 ENCOUNTER — Encounter: Payer: Self-pay | Admitting: Internal Medicine

## 2020-09-03 ENCOUNTER — Ambulatory Visit (INDEPENDENT_AMBULATORY_CARE_PROVIDER_SITE_OTHER): Payer: Medicare Other | Admitting: Internal Medicine

## 2020-09-03 VITALS — BP 120/80 | HR 82 | Ht 64.0 in | Wt 182.0 lb

## 2020-09-03 DIAGNOSIS — E8881 Metabolic syndrome: Secondary | ICD-10-CM

## 2020-09-03 DIAGNOSIS — Z853 Personal history of malignant neoplasm of breast: Secondary | ICD-10-CM | POA: Diagnosis not present

## 2020-09-03 DIAGNOSIS — E1169 Type 2 diabetes mellitus with other specified complication: Secondary | ICD-10-CM | POA: Diagnosis not present

## 2020-09-03 DIAGNOSIS — E785 Hyperlipidemia, unspecified: Secondary | ICD-10-CM | POA: Diagnosis not present

## 2020-09-03 DIAGNOSIS — E039 Hypothyroidism, unspecified: Secondary | ICD-10-CM | POA: Diagnosis not present

## 2020-09-03 DIAGNOSIS — R0602 Shortness of breath: Secondary | ICD-10-CM

## 2020-09-03 DIAGNOSIS — Z6831 Body mass index (BMI) 31.0-31.9, adult: Secondary | ICD-10-CM

## 2020-09-03 MED ORDER — FUROSEMIDE 40 MG PO TABS: 40.0000 mg | ORAL_TABLET | Freq: Every day | ORAL | 3 refills | Status: DC

## 2020-09-03 NOTE — Progress Notes (Signed)
   Subjective:    Patient ID: Victoria Hess, female    DOB: 1945/07/06, 75 y.o.   MRN: [id number]  HPI 75 year old Female presents for 76-month recheck.  She is complaining of some shortness of breath. Denies chest pain. Is worried about her heart.  She has a history of hypertension, hypothyroidism, controlled type 2 diabetes mellitus, obesity, hyperlipidemia, metabolic syndrome and history of breast cancer.  History of bilateral ductal carcinoma in situ.  She had wide excision procedure of the left breast in 2011 and a lesser procedure done on the right breast in 2002.  She has done well since that time.  Tonsillectomy and adenoidectomy 1955.  Appendectomy and tubal ligation 1977.  History of uterine fibroids, cystocele, uterine prolapse.  Social history: She lives in [place].  General health has been good.  Does not smoke or consume alcohol.  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  Family history: Father died at age 23 of a stroke.  Mother died from coronary disease.  1 brother with history of hypertension and diabetes.  1 sister with hypertension and diabetes.  Another sister with hypertension.  1 sister in good health without medical issues.    Review of Systems left  Ear pain. No chest pain but had issue twice with catching her breath. Gained 8 pounds since October. Used to walk a lot but has had episodes of SOB and has been afraid to walk alone.  We had her get a chest x-ray today and there are no acute abnormalities on this chest x-ray.  Her pulse oximetry is normal on room air.     Objective:   Physical Exam Vital signs reviewed.  Blood pressure 120/80 pulse 82 regular pulse oximetry 97%, weight 192 pounds BMI 31.24.  Skin: Warm and dry.  No cervical adenopathy.  No thyromegaly.  No carotid bruits.  Respiratory rate is normal today.  Chest clear to auscultation.  Cardiac exam: Regular  rate and rhythm normal S1 and S2 without murmurs or gallops. No pitting edema.         Assessment & Plan:  Unexplained episodes of SOB- negative CXR  and normal pulse ox  Family hx of heart disease in mother, stroke in father  Risk factors for heart disease DM and hyperlipidemia  Remote history of breast cancer  Hgb AIC stable at 6.1% was 5.9% Oct 2021  Lipid panel stable on statin  Remote hx of bilateral breast cancer- normal mammogram 2021  Plan: Since CXR and pulse ox normal, refer to cardiology for evaluation. RTC for CPE and Medicare wellness in 6 months

## 2020-09-09 NOTE — Patient Instructions (Signed)
Referral to Cardiology since you are having shortness of breath with a family history of heart disease.  Chest x-ray is negative.  Need to diet and exercise and lose some weight.  Return here in 6 months for Medicare wellness and health maintenance exam.

## 2020-09-15 ENCOUNTER — Encounter: Payer: Self-pay | Admitting: Internal Medicine

## 2020-09-15 ENCOUNTER — Telehealth: Payer: Self-pay | Admitting: Internal Medicine

## 2020-09-15 ENCOUNTER — Other Ambulatory Visit: Payer: Self-pay

## 2020-09-15 ENCOUNTER — Ambulatory Visit: Payer: Medicare Other | Admitting: Internal Medicine

## 2020-09-15 ENCOUNTER — Telehealth (INDEPENDENT_AMBULATORY_CARE_PROVIDER_SITE_OTHER): Payer: Medicare Other | Admitting: Internal Medicine

## 2020-09-15 VITALS — Temp 98.0°F

## 2020-09-15 DIAGNOSIS — U071 COVID-19: Secondary | ICD-10-CM

## 2020-09-15 DIAGNOSIS — Z7189 Other specified counseling: Secondary | ICD-10-CM

## 2020-09-15 NOTE — Telephone Encounter (Signed)
Victoria Hess [phone]  [name] just called to say she has tested positive with a home COVID test, she feels fine, no fever. She was exposed last Thursday. She wants to know what should she do. I did let her know she should quarantine for 5 days, and I would send you message and we may do virtual visit tomorrow to advise.

## 2020-09-15 NOTE — Telephone Encounter (Signed)
Can she do virtual visit today?

## 2020-09-15 NOTE — Telephone Encounter (Signed)
Dr. Renold Genta spoke with patient on the phone.

## 2020-09-15 NOTE — Telephone Encounter (Signed)
Attempted to connect with patient by virtual visit. She has called for advice regarding positive home test for Covid. Began with cough today. Husband helps handicapped man and husband was exposed through that gentleman and tested positive last Thursday. He has a mild case of Covid-19. Patient began slight cough today. Did home test which was positive. She is now at health department getting PCR test.  She is not SOB. Temp is 98 degrees. Other than cough has no other symptoms. No nausea, vomiting, headache, dysgeusia, shaking chills.    Patient is currently at the [facility] getting a COVID PCR test and I am at my office.  We were not able to connect virtually as patient did not have that capability at the present time.  She is identified using 2 identifiers as Victoria Hess, a patient in this practice.  She is agreeable to visit in this format today.  She is seeking advice as to what to do now that she is tested positive for COVID-19.  Other than slight cough, she has no other significant symptoms.  Advised patient to check her pulse oximetry level on a regular basis.  She does not have a device and someone should bring her 1.  She should stay well-hydrated and monitor her symptoms.  Currently she is not nauseated and does not not need antinausea medication.  She has had 3 COVID-19 immunizations according to our chart.  Patient will need to plan to get COVID booster in a couple of months.  Advised her to call back if she develops significant shortness of breath, nausea and vomiting or other worrisome symptoms.  Recommended that she walk several times daily.  Stay well-hydrated with plenty of fluids.  She thanked me for calling.  Advised that she may contact me at any time if she has questions.  Patient was just here on April 22 for her 48-month recheck and was complaining at that time of some mild shortness of breath.  She does not mention it today.  She has no chest  pain.  She was worried about her heart and will be referred to Cardiology after she recovers from COVID-19.  She has a history of hypertension, hypothyroidism, controlled type 2 diabetes mellitus, obesity, hyperlipidemia, metabolic syndrome and history of breast cancer.  She resides in [place].  She does not smoke or consume alcohol.  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  Medications reviewed and include antihypertensive medications, levothyroxine, metformin, simvastatin and potassium supplement.  Hemoglobin A1c on April 19 was 6.1%.  Lipid panel was essentially normal as well as liver functions and TSH.  Advised patient to walk several times daily and stay well-hydrated.  May take Tylenol if she develops a fever.  Call if symptoms worsen or has further questions or concerns.  Time spent reviewing chart, speaking with patient on telephone, reviewing medications, medical decision making, documenting conversation in chart is 15 minutes.

## 2020-09-22 ENCOUNTER — Telehealth: Payer: Self-pay | Admitting: Internal Medicine

## 2020-09-22 NOTE — Telephone Encounter (Addendum)
Faxed Positive COVID results to [facility] along with demographics. [phone].  Positive as of 09/15/2020

## 2020-09-26 ENCOUNTER — Encounter: Payer: Self-pay | Admitting: Internal Medicine

## 2020-09-26 NOTE — Progress Notes (Addendum)
   Subjective:    Patient ID: Victoria Hess, female    DOB: 04/20/1946, 75 y.o.   MRN: [id number]  HPI 75 year old Female called saying that she tested positive with home test for COVID-19.  She stated she felt fine and had no fever.  Was exposed this past Thursday, April 28.  Her husband has COVID.  We did advise that she should quarantine for 5 days.  We advised a virtual visit.  We attempted to connect virtually but patient indicates that she is currently at the health department trying to get a COVID PCR test and cannot connect with [place] virtually so we continued by telephone.  She is at the [facility] in [place] and I am in my office.  She is identified using 2 identifiers as Victoria Hess, a patient in this practice.  She is agreeable to visit in this format today.  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  She has a history of hypertension, hypothyroidism, type 2 diabetes mellitus which is well controlled, obesity, hyperlipidemia, metabolic syndrome.  She resides in [place].  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  Does not smoke or consume alcohol.  She is married and husband recently came down with COVID-19 and exposed her.  I records indicate she has had 3 COVID-19 immunizations the last 1 being November 2021.   Review of Systems see above-she denies nausea vomiting, fever, chills, dysgeusia. Respiratory symptoms such as cough or nasal congestion or not present     Objective:   Physical Exam  A virtual visit was not possible because she was at the health department when we called her back and she could not connect virtually with this but she was able to speak with [place] by phone.  She had no shortness of breath, cough, fever, chills, dysgeusia or myalgias.  No nausea or  vomiting.  She was awaiting a COVID-19 PCR test from the health department.  Reported no fever.      Assessment & Plan:  Acute COVID-19 virus infection based on home COVID test and apparently was exposed by her husband.  Plan: Patient will quarantine at home and monitor her symptoms over several days.  She will look for signs of cough and congestion, shortness of breath, fever, chills.  She will call if she develops significant symptoms.  Otherwise she will remain at home under quarantine minimum of 5 days.  Addendum: Time spent on phone is 7 minutes.

## 2020-09-26 NOTE — Patient Instructions (Signed)
Since you have tested for COVID-19 and are asymptomatic, we suggest you quarantine at home.  Your husband has tested positive as well.  Monitor your pulse oximetry by finger device.  Walks some outside to prevent atelectasis of the lungs.  May take Tylenol if you develop fever.  Call if symptoms worsen.  Otherwise plan to quarantine for 5 days.  Please forward results of COVID-19 testing at health department to [place].

## 2020-10-07 ENCOUNTER — Encounter: Payer: Self-pay | Admitting: Obstetrics and Gynecology

## 2020-10-07 ENCOUNTER — Ambulatory Visit: Payer: Medicare Other | Admitting: Obstetrics and Gynecology

## 2020-10-07 ENCOUNTER — Other Ambulatory Visit: Payer: Self-pay

## 2020-10-07 VITALS — BP 124/74 | HR 71 | Ht 64.0 in | Wt 178.0 lb

## 2020-10-07 DIAGNOSIS — N852 Hypertrophy of uterus: Secondary | ICD-10-CM

## 2020-10-07 DIAGNOSIS — N950 Postmenopausal bleeding: Secondary | ICD-10-CM | POA: Diagnosis not present

## 2020-10-07 DIAGNOSIS — N813 Complete uterovaginal prolapse: Secondary | ICD-10-CM

## 2020-10-07 NOTE — Progress Notes (Signed)
75 y.o. G1P1001 Married Black or [place] American Not Hispanic or Latino female here uterine prolapse. She has had the prolapse for years. She used to be able to push it up and it would stay up for a while, that doesn't work anymore. The prolapse is out all of the time, sometimes uncomfortable, particularly if she is walking or sitting on something hard. No recent vaginal bleeding.  She did have some bleeding a year ago, the area can get dry and irritated. She isn't sure if she felt irritated when she noticed the bleeding. She need to reduce her prolapse to void, feels like she is voiding well. No incontinence. No bowel c/o.  Not sexually active, it hurts and will cause bleeding. Last sexually active 5 years ago.   H/O bilateral breast cancer.       No LMP recorded. Patient is postmenopausal.          Sexually active: No.  The current method of family planning is post menopausal status.    Exercising: No.  The patient does not participate in regular exercise at present. Smoker:  no  Health Maintenance: Pap:  08/14/2014 WNL History of abnormal Pap:  no MMG:  03/04/20 density B Bi-rads 1 neg  BMD:   2015 normal 1.6 t-score Colonoscopy: 01/30/2017 f/u 10 years TDaP:  2011 Gardasil: NA   reports that she has never smoked. She has never used smokeless tobacco. She reports that she does not drink alcohol and does not use drugs. Daughter lives outside of [place]. [place] daughters are 78 and 85.   Past Medical History:  Diagnosis Date  . Breast cancer ([place])   . Cancer ([place]) [id number]   Stage 2 breast cancer bil  . Diabetes mellitus without complication ([place])   . Family history of breast cancer   . Family history of stomach cancer   . Hyperlipidemia   . Hypertension   . Personal history of radiation therapy 2002,2011  . Thyroid disease    hypothyroidism  . Wears dentures    partial  . Wears glasses     Past Surgical History:  Procedure Laterality Date  . APPENDECTOMY    . BREAST  LUMPECTOMY Right 2002  . BREAST LUMPECTOMY Left 03/07/2010  . BREAST SURGERY     left lumpectomy sentinel  node biopsy  . BREAST SURGERY     right lumpectomy  . COLONOSCOPY  2008   at [facility], "normal" exam  . TUBAL LIGATION      Current Outpatient Medications  Medication Sig Dispense Refill  . amLODipine (NORVASC) 10 MG tablet TAKE 1 TABLET BY MOUTH  DAILY 90 tablet 3  . aspirin 81 MG tablet Take 81 mg by mouth daily.    . Blood Glucose Monitoring Suppl (ONE TOUCH ULTRA SYSTEM KIT) W/DEVICE KIT 1 kit by Does not apply route once. Test bid.  DX E11.9 1 each 0  . BYSTOLIC 20 MG TABS TAKE 1 TABLET BY MOUTH  DAILY 90 tablet 3  . Calcium Carbonate (CALCIUM 600 PO) Take by mouth daily.    . Cholecalciferol (VITAMIN D PO) Take 2,000 mg by mouth daily.    . furosemide (LASIX) 40 MG tablet Take 1 tablet (40 mg total) by mouth daily. 90 tablet 3  . glucose blood (BAYER CONTOUR NEXT TEST) test strip Use as instructed 100 each 12  . IRON PO Take by mouth 2 (two) times daily.    . Lancets (ONETOUCH ULTRASOFT) lancets Test BID.  DX E11.9 100 each 12  .  levothyroxine (SYNTHROID) 50 MCG tablet TAKE 1 TABLET BY MOUTH  DAILY 90 tablet 3  . metFORMIN (GLUCOPHAGE) 500 MG tablet TAKE 1 TABLET BY MOUTH  TWICE DAILY WITH MEALS 180 tablet 3  . Multiple Vitamin (MULTIVITAMIN PO) Take by mouth daily.    . potassium chloride SA (KLOR-CON) 20 MEQ tablet TAKE 1 TABLET BY MOUTH  TWICE DAILY 180 tablet 3  . simvastatin (ZOCOR) 10 MG tablet TAKE 1 TABLET BY MOUTH AT  BEDTIME 90 tablet 3   No current facility-administered medications for this visit.    Family History  Problem Relation Age of Onset  . Diabetes Mother   . Heart disease Mother   . Iron deficiency Mother   . Diabetes Father   . Iron deficiency Father   . Stomach cancer Father 24  . Diabetes Sister   . Hyperlipidemia Sister   . Iron deficiency Sister   . Breast cancer Sister 34       DCIS  . Iron deficiency Sister   . Breast cancer Cousin   .  Breast cancer Maternal Aunt 80  . Hepatitis C Brother   . Cancer Brother        hepatocellular  . Colon cancer Neg Hx     Review of Systems  All other systems reviewed and are negative.   Exam:   BP 124/74   Pulse 71   Ht _0  (1.626 m)   Wt 178 lb (80.7 kg)   SpO2 100%   BMI 30.55 kg/m   Weight change: _1 @ Height:   Height: _2  (162.6 cm)  Ht Readings from Last 3 Encounters:  10/07/20 _3  (1.626 m)  09/03/20 _4  (1.626 m)  02/26/20 _5  (1.626 m)    General appearance: alert, cooperative and appears stated age   Pelvic: External genitalia:  no lesions              Urethra:  normal appearing urethra with no masses, tenderness or lesions              Bartholins and Skenes: normal                 Vagina: atrophic vaginal mucosa  Procidentia, cervix is at least 4 cm outside of her introitus with valsalva. Posterior rectal wall is well supported in the lower 3-4 cm, upper wall is relaxed. Anterior vaginal wall is relaxed              Cervix: no lesions               Bimanual Exam:  Uterus:  top normal sized, retroverted, not tender              Adnexa: no mass, fullness, tenderness   She was fitted with a #4 ring pessary with support, it fell out.  Fitted with a #5 ring pessary with support, it was uncomfortable. She didn't want to try any further pessaries today                Gae Dry chaperoned for the exam.   1. Uterine procidentia Discussed options: do nothing, pessary and surgery Unable to fit her with a ring pessary Discussed the option of trying a different pessary, she didn't want to do that today Reviewed options of colpocleisis, vaginal repair and robotic repair. I think her best options are colpocleisis or robotic repair. She hasn't been sexually active for 5 years, but isn't sure she wants to eliminate the possibility  of intercourse.  ACOG handouts on prolapse and surgery for prolapse were given to the patient and reviewed  2.  Postmenopausal bleeding Not recently, but not evaluated. Unclear if it was from the prolapse rubbing or coming from her endometrium - US PELVIS TRANSVAGINAL NON-OB (TV ONLY); Future  3. Uterine enlargement Feels enlarged for menopausal patient, no h/o fibroids - US PELVIS TRANSVAGINAL NON-OB (TV ONLY); Future  In addition to the pessary fitting, ~30 minutes in total patient care.   CC: Dr Renold Genta

## 2020-10-07 NOTE — Patient Instructions (Signed)
Pelvic Organ Prolapse Pelvic organ prolapse is a condition in women that involves the stretching, bulging, or dropping of pelvic organs into an abnormal position, past the opening of the vagina. It happens when the muscles and tissues that surround and support pelvic structures become weak or stretched. Pelvic organ prolapse can involve the:  Vagina (vaginal prolapse).  Uterus (uterine prolapse).  Bladder (cystocele).  Rectum (rectocele).  Intestines (enterocele). When organs other than the vagina are involved, they often bulge into the vagina or protrude from the vagina, depending on how severe the prolapse is. What are the causes? This condition may be caused by:  Pregnancy, labor, and childbirth.  Past pelvic surgery.  Lower levels of the hormone estrogen due to menopause.  Consistently lifting more than 50 lb (23 kg).  Obesity.  Long-term difficulty passing stool (chronic constipation).  Long-term, or chronic, cough.  Fluid buildup in the abdomen due to certain conditions. What are the signs or symptoms? Symptoms of this condition include:  Leaking a little urine (loss of bladder control) when you cough, sneeze, strain, and exercise (stress incontinence). This may be worse immediately after childbirth. It may gradually improve over time.  Feeling pressure in your pelvis or vagina. This pressure may increase when you cough or when you are passing stool.  A bulge that protrudes from the opening of your vagina.  Difficulty passing urine or stool.  Pain in your lower back.  Pain or discomfort during sex, or decreased interest in sex.  Repeated bladder infections (urinary tract infections).  Difficulty inserting a tampon. In some people, this condition causes no symptoms. How is this diagnosed? This condition may be diagnosed based on a vaginal and rectal exam. During the exam, you may be asked to cough and strain while you are lying down, sitting, and standing up.  Your health care provider will determine if other tests are required, such as bladder function tests. How is this treated? Treatment for this condition may depend on your symptoms. Treatment may include:  Lifestyle changes, such as drinking plenty of fluids and eating foods that are high in fiber.  Emptying your bladder at scheduled times (bladder training therapy). This can help reduce or avoid urinary incontinence.  Estrogen. This may help mild prolapse by increasing the strength and tone of pelvic floor muscles.  Kegel exercises. These may help mild cases of prolapse by strengthening and tightening the muscles of the pelvic floor.  A soft, flexible device that helps support the vaginal walls and keep pelvic organs in place (pessary). This is inserted into your vagina by your health care provider.  Surgery. This is often the only form of treatment for severe prolapse. Follow these instructions at home: Eating and drinking  Avoid drinking beverages that contain caffeine or alcohol.  Increase your intake of high-fiber foods to decrease constipation and straining during bowel movements. Activity  Lose weight if recommended by your health care provider.  Avoid heavy lifting and straining with exercise and work. Do not hold your breath when you perform mild to moderate lifting and exercise activities. Limit your activities as directed by your health care provider.  Do Kegel exercises as directed by your health care provider. To do this: ? Squeeze your pelvic floor muscles tight. You should feel a tight lift in your rectal area and a tightness in your vaginal area. Keep your stomach, buttocks, and legs relaxed. ? Hold the muscles tight for up to 10 seconds. Then relax your muscles. ? Repeat this exercise  50 times a day, or as much as told by your health care provider. Continue to do this exercise for at least 4-6 weeks, or for as long as told by your health care provider. General  instructions  Take over-the-counter and prescription medicines only as told by your health care provider.  Wear a sanitary pad or adult diapers if you have urinary incontinence.  If you have a pessary, take care of it as told by your health care provider.  Keep all follow-up visits. This is important. Contact a health care provider if you:  Have symptoms that interfere with your daily activities or sex life.  Need medicine to help with the discomfort.  Notice bleeding from your vagina that is not related to your menstrual period.  Have a fever.  Have pain or bleeding when you urinate.  Have bleeding when you pass stool.  Pass urine when you have sex.  Have chronic constipation.  Have a pessary that falls out.  Have a foul-smelling vaginal discharge.  Have an unusual, low pain in your abdomen. Get help right away if you:  Cannot pass urine. Summary  Pelvic organ prolapse is the stretching, bulging, or dropping of pelvic organs into an abnormal position. It happens when the muscles and tissues that surround and support pelvic structures become weak or stretched.  When organs other than the vagina are involved, they often bulge into the vagina or protrude from it, depending on how severe the prolapse is.  In most cases, this condition needs to be treated only if it produces symptoms. Treatment may include lifestyle changes, estrogen, Kegel exercises, pessary insertion, or surgery.  Avoid heavy lifting and straining with exercise and work. Do not hold your breath when you perform mild to moderate lifting and exercise activities. Limit your activities as directed by your health care provider. This information is not intended to replace advice given to you by your health care provider. Make sure you discuss any questions you have with your health care provider. Document Revised: 10/27/2019 Document Reviewed: 10/27/2019 Elsevier Patient Education  2021 [place], also called colpectomy, is a surgery to partially or completely remove and stitch (suture) the vagina closed, including the opening. This procedure may be done to help with problems caused by the falling down (prolapse) of one or more pelvic organs. Prolapse could involve the vagina, uterus, bladder, rectum, or intestines. It is often caused by previous pregnancies and pelvic surgery, heavy straining and lifting for long periods of time, long-term, or chronic, constipation with straining, obesity, and aging. Colpocleisis may be done in women who:  Have stopped menstruating.  Have already had the uterus removed (had a hysterectomy).  No longer plan to have vaginal sex. After this surgery, your vagina will not be deep enough to permit vaginal intercourse.  Have medical problems that make more advanced surgery very risky. Tell a health care provider about:  Any allergies you have.  All medicines you are taking, including vitamins, herbs, eye drops, creams, and over-the-counter medicines.  Any problems you or family members have had with anesthetic medicines.  Any blood disorders you have.  Any surgeries you have had.  Any medical conditions you have.  Any colds or infections you have had recently. What are the risks? Generally, this is a safe procedure. However, problems may occur, including:  Infection.  Bleeding.  Allergic reaction to medicines.  Damage to nearby structures or organs.  Blood clots in the legs or lungs. What happens  before the procedure? Staying hydrated Follow instructions from your health care provider about hydration, which may include:  Up to 2 hours before the procedure - you may continue to drink clear liquids, such as water, clear fruit juice, black coffee, and plain tea.   Eating and drinking restrictions Follow instructions from your health care provider about eating and drinking, which may include:  8 hours  before the procedure - stop eating heavy meals or foods, such as meat, fried foods, or fatty foods.  6 hours before the procedure - stop eating light meals or foods, such as toast or cereal.  6 hours before the procedure - stop drinking milk or drinks that contain milk.  2 hours before the procedure - stop drinking clear liquids. Medicines Ask your health care provider about:  Changing or stopping your regular medicines. This is especially important if you are taking diabetes medicines or blood thinners.  Taking medicines such as aspirin and ibuprofen. These medicines can thin your blood. Do not take these medicines unless your health care provider tells you to take them.  Taking over-the-counter medicines, vitamins, herbs, and supplements. General instructions  Do not use any products that contain nicotine or tobacco for at least 4 weeks before the procedure. These products include cigarettes, chewing tobacco, and vaping devices, such as e-cigarettes. If you need help quitting, ask your health care provider.  Ask your health care provider what steps will be taken to help prevent infection. These may include: ? Removing hair at the surgery site. ? Washing skin with a germ-killing soap. ? Receiving antibiotic medicine.  Plan to have a responsible adult take you home from the hospital or clinic.  Plan to have a responsible adult care for you for the time you are told after you leave the hospital or clinic. This is important. What happens during the procedure?  An IV will be inserted into one of your veins.  You will be given one or more of the following: ? A medicine to help you relax (sedative). ? A medicine to make you fall asleep (general anesthetic). ? A medicine that is injected into your spine to numb the area below and slightly above the injection site (spinal anesthetic).  The prolapsed organs will be put back to their normal position. This will be done by hand through the  vagina.  A small incision will be made in the vagina, and the top and bottom of the vagina will be removed. No incision in the abdomen will be needed.  The opening of the vagina will be closed using sutures that dissolve in 1-2 months. The procedure may vary among health care providers and hospitals.   What happens after the procedure?  Your blood pressure, heart rate, breathing rate, and blood oxygen level will be monitored until you leave the hospital or clinic.  You will still have an IV in your vein. This will be removed before you go home.  You will also have a small, thin tube (catheter) in your bladder to keep your bladder empty. It will likely be removed the day after surgery. Your health care provider will talk with you about when this can be removed. They will give you any special instructions that you need.  You may be given an antibiotic to help prevent infection.  You may be given pain medicine.  Do not drive or operate machinery until your health care provider says that it is safe. Summary  Colpocleisis, or colpectomy, is a surgical procedure  to partially or completely remove and stitch (suture) the vagina closed, including the opening.  This procedure may be done to help with problems caused by the falling down (prolapse) of one or more pelvic organs.  Before the procedure, ask your health care provider about changing or stopping your regular medicines.  After the procedure, your blood pressure, heart rate, breathing rate, and blood oxygen level will be monitored until you leave the hospital or clinic. This information is not intended to replace advice given to you by your health care provider. Make sure you discuss any questions you have with your health care provider. Document Revised: 03/02/2020 Document Reviewed: 12/13/2019 Elsevier Patient Education  [place].

## 2020-10-08 ENCOUNTER — Encounter: Payer: Self-pay | Admitting: Cardiovascular Disease

## 2020-10-08 ENCOUNTER — Ambulatory Visit: Payer: Medicare Other | Admitting: Cardiovascular Disease

## 2020-10-08 VITALS — BP 98/72 | HR 65 | Ht 64.0 in | Wt 179.0 lb

## 2020-10-08 DIAGNOSIS — R0989 Other specified symptoms and signs involving the circulatory and respiratory systems: Secondary | ICD-10-CM

## 2020-10-08 DIAGNOSIS — R072 Precordial pain: Secondary | ICD-10-CM | POA: Diagnosis not present

## 2020-10-08 DIAGNOSIS — R7303 Prediabetes: Secondary | ICD-10-CM

## 2020-10-08 DIAGNOSIS — E7800 Pure hypercholesterolemia, unspecified: Secondary | ICD-10-CM | POA: Diagnosis not present

## 2020-10-08 DIAGNOSIS — R011 Cardiac murmur, unspecified: Secondary | ICD-10-CM | POA: Diagnosis not present

## 2020-10-08 DIAGNOSIS — R0602 Shortness of breath: Secondary | ICD-10-CM | POA: Diagnosis not present

## 2020-10-08 NOTE — Progress Notes (Signed)
Cardiology Office Note:    Date:  10/12/2020   ID:  [name], DOB June 25, 1945, MRN [id number]  PCP:  Elby Showers, MD   [facility] Providers Cardiologist:  Sanda Klein, MD     Referring MD: Elby Showers, MD   Chief Complaint  Patient presents with  . New Patient (Initial Visit)  Victoria Hess is a 75 y.o. female who is being seen today for the evaluation of chest discomfort and dyspnea at the request of Baxley, Cresenciano Lick, MD.   History of Present Illness:    Victoria Hess is a 75 y.o. 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  For the last 6 months or so she has been experiencing dyspnea on exertion.  Climbing a flight of stairs will make her short of breath.  Sometimes she becomes short of breath taking a shower.  She got COVID-19 infection on May 1 manifesting mostly as cough, not really worsening her dyspnea.  She has occasional wheezing at night when she lies in bed.  She denies lower extremity edema and has not had frank orthopnea or PND.  She denies palpitations, syncope, focal neurological events, claudication or major weight changes.  He is also had a few episodes of severe chest discomfort described as "elephant sitting on my chest".  The first 1 happened in 1995.  The second 1 occurred in December 2021.  She took 2 baby aspirin and went to bed and felt fine the following day.  She has a strong family history of cardiovascular illness, but not of premature onset.  Her mother and father both had strokes and died in their early 25s.  Her brother received a stent when he was 43 years old.  She has never smoked.  Most recent labs from April 2022 showed a hemoglobin A1c of 6.1% and an LDL of 86 on a very low-dose of simvastatin, with an exceptionally high HDL of 97  She reports having a  nuclear stress test at [facility] which was reportedly normal in 2001.  She has never had a cardiac catheterization.  Her EKG today shows sinus rhythm with a single PAC and T wave inversion across the anteroseptal leads with poor R wave progression all the way to lead V5.  Her blood pressure is relatively low at 98/72, but she is asymptomatic.  Past Medical History:  Diagnosis Date  . Breast cancer ([place])   . Cancer ([place]) [id number]   Stage 2 breast cancer bil  . Diabetes mellitus without complication ([place])   . Family history of breast cancer   . Family history of stomach cancer   . Hyperlipidemia   . Hypertension   . Personal history of radiation therapy 2002,2011  . Thyroid disease    hypothyroidism  . Wears dentures    partial  . Wears glasses     Past Surgical History:  Procedure Laterality Date  . APPENDECTOMY    . BREAST LUMPECTOMY Right 2002  . BREAST LUMPECTOMY Left 03/07/2010  . BREAST SURGERY     left lumpectomy sentinel  node biopsy  . BREAST SURGERY     right lumpectomy  . COLONOSCOPY  2008   at [place], "normal" exam  . TUBAL LIGATION      Current Medications: Current Meds  Medication Sig  . amLODipine (NORVASC) 10 MG tablet TAKE 1 TABLET BY  MOUTH  DAILY  . aspirin 81 MG tablet Take 81 mg by mouth daily.  . Blood Glucose Monitoring Suppl (ONE TOUCH ULTRA SYSTEM KIT) W/DEVICE KIT 1 kit by Does not apply route once. Test bid.  DX G62.6  . BYSTOLIC 20 MG TABS TAKE 1 TABLET BY MOUTH  DAILY  . Calcium Carbonate (CALCIUM 600 PO) Take by mouth daily.  . Cholecalciferol (VITAMIN D PO) Take 2,000 mg by mouth daily.  . furosemide (LASIX) 40 MG tablet Take 1 tablet (40 mg total) by mouth daily.  Marland Kitchen glucose blood (BAYER CONTOUR NEXT TEST) test strip Use as instructed  . IRON PO Take by mouth 2 (two) times daily.  . Lancets (ONETOUCH ULTRASOFT) lancets Test BID.  DX E11.9  . levothyroxine (SYNTHROID) 50 MCG tablet TAKE 1 TABLET BY MOUTH  DAILY  . metFORMIN (GLUCOPHAGE) 500 MG  tablet TAKE 1 TABLET BY MOUTH  TWICE DAILY WITH MEALS  . Multiple Vitamin (MULTIVITAMIN PO) Take by mouth daily.  . potassium chloride SA (KLOR-CON) 20 MEQ tablet TAKE 1 TABLET BY MOUTH  TWICE DAILY  . simvastatin (ZOCOR) 10 MG tablet TAKE 1 TABLET BY MOUTH AT  BEDTIME     Allergies:   Irbesartan   Social History   Socioeconomic History  . Marital status: Married    Spouse name: Not on file  . Number of children: Not on file  . Years of education: Not on file  . Highest education level: Not on file  Occupational History  . Not on file  Tobacco Use  . Smoking status: Never Smoker  . Smokeless tobacco: Never Used  Vaping Use  . Vaping Use: Never used  Substance and Sexual Activity  . Alcohol use: No  . Drug use: No  . Sexual activity: Not Currently    Birth control/protection: Post-menopausal  Other Topics Concern  . Not on file  Social History Narrative  . Not on file   Social Determinants of Health   Financial Resource Strain: Not on file  Food Insecurity: Not on file  Transportation Needs: Not on file  Physical Activity: Not on file  Stress: Not on file  Social Connections: Not on file     Family History: The patient's family history includes Breast cancer in her cousin; Breast cancer (age of onset: 51) in her sister; Breast cancer (age of onset: 36) in her maternal aunt; Cancer in her brother; Diabetes in her father, mother, and sister; Heart disease in her mother; Hepatitis C in her brother; Hyperlipidemia in her sister; Iron deficiency in her father, mother, sister, and sister; Stomach cancer (age of onset: 88) in her father. There is no history of Colon cancer.  ROS:   Please see the history of present illness.     All other systems reviewed and are negative.  EKGs/Labs/Other Studies Reviewed:    The following studies were reviewed today:  EKG:  EKG is  ordered today.  The ekg ordered today demonstrates sinus rhythm with a single PAC.  Questionable voltage  criteria for LVH.  There is very delayed R wave progression across anterior precordium with a dominant R wave only in lead V5 and there is T wave inversion leads V1-V3.  QTc is 413 ms.  Recent Labs: 02/24/2020: BUN 19; Creat 0.89; Hemoglobin 11.7; Platelets 252; Potassium 5.3; Sodium 140 08/31/2020: ALT 13; TSH 3.27  Recent Lipid Panel    Component Value Date/Time   CHOL 200 (H) 08/31/2020 0941   TRIG 82 08/31/2020 0941  HDL 97 08/31/2020 0941   CHOLHDL 2.1 08/31/2020 0941   VLDL 9 11/10/2016 1103   LDLCALC 86 08/31/2020 0941     Risk Assessment/Calculations:       Physical Exam:    VS:  BP 98/72 (BP Location: Left Arm, Patient Position: Sitting, Cuff Size: Large)   Pulse 65   Ht _0  (1.626 m)   Wt 179 lb (81.2 kg)   BMI 30.73 kg/m     Wt Readings from Last 3 Encounters:  10/08/20 179 lb (81.2 kg)  10/07/20 178 lb (80.7 kg)  09/03/20 182 lb (82.6 kg)     GEN: Borderline obese, well nourished, well developed in no acute distress HEENT: Normal NECK: No JVD; she has a very distinct left carotid bruit LYMPHATICS: No lymphadenopathy CARDIAC: RRR, early to mid peaking systolic ejection murmur is heard best at the right upper sternal border with limited radiation towards the neck.  It does not appear to radiate all the way to her left carotid., rubs, gallops RESPIRATORY:  Clear to auscultation without rales, wheezing or rhonchi  ABDOMEN: Soft, non-tender, non-distended MUSCULOSKELETAL:  No edema; No deformity  SKIN: Warm and dry NEUROLOGIC:  Alert and oriented x 3 PSYCHIATRIC:  Normal affect   ASSESSMENT:    1. Precordial pain   2. Shortness of breath   3. Prediabetes   4. Murmur, cardiac   5. Hypercholesterolemia   6. Left carotid bruit    PLAN:    In order of problems listed above:  1. Chest discomfort: Although described as severe, this has been episodic, occurred at rest and resolved spontaneously.  Cannot exclude a previous myocardial infarction based on  her electrocardiogram.  She has numerous coronary risk factors including prediabetes, hypercholesterolemia, hypertension, obesity and a family history of CAD.  Recommend Lexiscan Myoview study. 2. Dyspnea: Multiple possible etiologies include left ventricular diastolic dysfunction or significant cardiomyopathy with reduced EF from previous infarction.  We will check an echocardiogram. 3. PreDM: Hemoglobin A1c 6.1% without medications.  Advised weight loss and regular activity after we clarify her possible cardiac issues 4. HLP: Her LDL cholesterol is only mildly elevated and on a low-dose of simvastatin is in the desirable range for a patient with diabetes.  Has an exceptionally good HDL. 5. Murmur: We will be able to evaluate with her echocardiogram.  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 6. Left carotid bruit: The cardiac murmur only has limited radiation towards the base of her neck and does not really reach the left carotid area.        Medication Adjustments/Labs and Tests Ordered: Current medicines are reviewed at length with the patient today.  Concerns regarding medicines are outlined above.  Orders Placed This Encounter  Procedures  . MYOCARDIAL PERFUSION IMAGING  . EKG 12-Lead  . ECHOCARDIOGRAM COMPLETE  . VAS US CAROTID   No orders of the defined types were placed in this encounter.   Patient Instructions  Medication Instructions:  No changes *If you need a refill on your cardiac medications before your next appointment, please call your pharmacy*   Lab Work: None ordered If you have labs (blood work) drawn today and your tests are completely normal, you will receive your results only by: Marland Kitchen MyChart Message (if you have MyChart) OR . A paper copy in the mail If you have any lab test that is abnormal or we need to change your treatment, we will call you to review the results.   Testing/Procedures: Your physician has requested  that you have an echocardiogram.  Echocardiography is a painless test that uses sound waves to create images of your heart. It provides your doctor with information about the size and shape of your heart and how well your heart's chambers and valves are working. You may receive an ultrasound enhancing agent through an IV if needed to better visualize your heart during the echo.This procedure takes approximately one hour. There are no restrictions for this procedure. This will take place at the [address], [address].   Your physician has requested that you have a lexiscan myoview. For further information please visit [email or web address]. Please follow instruction sheet, as given.   How to prepare for your Myocardial Perfusion Test:  Do not eat or drink 3 hours prior to your test, except you may have water.  Do not consume products containing caffeine (regular or decaffeinated) 12 hours prior to your test. (ex: coffee, chocolate, sodas, tea).  Do bring a list of your current medications with you.  If not listed below, you may take your medications as normal.  Do wear comfortable clothes (no dresses or overalls) and walking shoes, tennis shoes preferred (No heels or open toe shoes are allowed).  Do NOT wear cologne, perfume, aftershave, or lotions (deodorant is allowed).  The test will take approximately 3 to 4 hours to complete  If these instructions are not followed, your test will have to be rescheduled.  Your physician has requested that you have a carotid duplex. This test is an ultrasound of the carotid arteries in your neck. It looks at blood flow through these arteries that supply the brain with blood. Allow one hour for this exam. There are no restrictions or special instructions. This will take place at [place], [address].   Follow-Up: At [facility], you and your health needs are our priority.  As part of our continuing mission to provide you with exceptional heart care, we have created designated  Provider Care Teams.  These Care Teams include your primary Cardiologist (physician) and Advanced Practice Providers (APPs -  Physician Assistants and Nurse Practitioners) who all work together to provide you with the care you need, when you need it.  We recommend signing up for the patient portal called "MyChart".  Sign up information is provided on this After Visit Summary.  MyChart is used to connect with patients for Virtual Visits (Telemedicine).  Patients are able to view lab/test results, encounter notes, upcoming appointments, etc.  Non-urgent messages can be sent to your provider as well.   To learn more about what you can do with MyChart, go to [email or web address].    Your next appointment:   Follow up with APP or [name] after the tests.      Signed, Sanda Klein, MD  10/12/2020 5:40 PM    [facility]

## 2020-10-08 NOTE — Patient Instructions (Signed)
Medication Instructions:  No changes *If you need a refill on your cardiac medications before your next appointment, please call your pharmacy*   Lab Work: None ordered If you have labs (blood work) drawn today and your tests are completely normal, you will receive your results only by: Marland Kitchen MyChart Message (if you have MyChart) OR . A paper copy in the mail If you have any lab test that is abnormal or we need to change your treatment, we will call you to review the results.   Testing/Procedures: Your physician has requested that you have an echocardiogram. Echocardiography is a painless test that uses sound waves to create images of your heart. It provides your doctor with information about the size and shape of your heart and how well your heart's chambers and valves are working. You may receive an ultrasound enhancing agent through an IV if needed to better visualize your heart during the echo.This procedure takes approximately one hour. There are no restrictions for this procedure. This will take place at the [address], [address].   Your physician has requested that you have a lexiscan myoview. For further information please visit [email or web address]. Please follow instruction sheet, as given.   How to prepare for your Myocardial Perfusion Test:  Do not eat or drink 3 hours prior to your test, except you may have water.  Do not consume products containing caffeine (regular or decaffeinated) 12 hours prior to your test. (ex: coffee, chocolate, sodas, tea).  Do bring a list of your current medications with you.  If not listed below, you may take your medications as normal.  Do wear comfortable clothes (no dresses or overalls) and walking shoes, tennis shoes preferred (No heels or open toe shoes are allowed).  Do NOT wear cologne, perfume, aftershave, or lotions (deodorant is allowed).  The test will take approximately 3 to 4 hours to complete  If these instructions are not  followed, your test will have to be rescheduled.  Your physician has requested that you have a carotid duplex. This test is an ultrasound of the carotid arteries in your neck. It looks at blood flow through these arteries that supply the brain with blood. Allow one hour for this exam. There are no restrictions or special instructions. This will take place at [place], [address].   Follow-Up: At [facility], you and your health needs are our priority.  As part of our continuing mission to provide you with exceptional heart care, we have created designated Provider Care Teams.  These Care Teams include your primary Cardiologist (physician) and Advanced Practice Providers (APPs -  Physician Assistants and Nurse Practitioners) who all work together to provide you with the care you need, when you need it.  We recommend signing up for the patient portal called "MyChart".  Sign up information is provided on this After Visit Summary.  MyChart is used to connect with patients for Virtual Visits (Telemedicine).  Patients are able to view lab/test results, encounter notes, upcoming appointments, etc.  Non-urgent messages can be sent to your provider as well.   To learn more about what you can do with MyChart, go to [email or web address].    Your next appointment:   Follow up with APP or [name] after the tests.

## 2020-10-21 ENCOUNTER — Other Ambulatory Visit: Payer: Medicare Other | Admitting: Obstetrics and Gynecology

## 2020-10-21 ENCOUNTER — Other Ambulatory Visit: Payer: Medicare Other

## 2020-10-25 ENCOUNTER — Ambulatory Visit (HOSPITAL_COMMUNITY)
Admission: RE | Admit: 2020-10-25 | Discharge: 2020-10-25 | Disposition: A | Discharge status: Home / Self Care | Payer: Medicare Other | Source: Ambulatory Visit | Attending: Cardiology | Admitting: Cardiology

## 2020-10-25 ENCOUNTER — Other Ambulatory Visit: Payer: Self-pay

## 2020-10-25 DIAGNOSIS — R0989 Other specified symptoms and signs involving the circulatory and respiratory systems: Secondary | ICD-10-CM | POA: Insufficient documentation

## 2020-10-26 ENCOUNTER — Telehealth (HOSPITAL_COMMUNITY): Payer: Self-pay | Admitting: *Deleted

## 2020-10-26 NOTE — Telephone Encounter (Signed)
Patient given detailed instructions per Myocardial Perfusion Study Information Sheet for the test on 11/03/20 Patient notified to arrive 15 minutes early and that it is imperative to arrive on time for appointment to keep from having the test rescheduled.  If you need to cancel or reschedule your appointment, please call the office within 24 hours of your appointment. . Patient verbalized understanding. Kirstie Peri

## 2020-11-03 ENCOUNTER — Ambulatory Visit (HOSPITAL_BASED_OUTPATIENT_CLINIC_OR_DEPARTMENT_OTHER): Payer: Medicare Other

## 2020-11-03 ENCOUNTER — Ambulatory Visit (HOSPITAL_COMMUNITY): Payer: Medicare Other | Attending: Cardiology

## 2020-11-03 ENCOUNTER — Other Ambulatory Visit: Payer: Self-pay

## 2020-11-03 DIAGNOSIS — R0602 Shortness of breath: Secondary | ICD-10-CM | POA: Insufficient documentation

## 2020-11-03 DIAGNOSIS — R011 Cardiac murmur, unspecified: Secondary | ICD-10-CM

## 2020-11-03 LAB — ECHOCARDIOGRAM COMPLETE
AR max vel: 1.74 cm2
AV Area VTI: 1.81 cm2
AV Area mean vel: 1.7 cm2
AV Mean grad: 6.7 mmHg
AV Peak grad: 12.6 mmHg
Ao pk vel: 1.78 m/s
Area-P 1/2: 3.03 cm2
Height: 64 in
S' Lateral: 2.5 cm
Weight: 2864 oz

## 2020-11-03 LAB — MYOCARDIAL PERFUSION IMAGING
LV dias vol: 65 mL (ref 46–106)
LV sys vol: 24 mL
Peak HR: 81 {beats}/min
Rest HR: 66 {beats}/min
SDS: 1
SRS: 0
SSS: 1
TID: 0.94

## 2020-11-03 MED ORDER — TECHNETIUM TC 99M TETROFOSMIN IV KIT
10.7000 | PACK | Freq: Once | INTRAVENOUS | Status: AC | PRN
  Administered 2020-11-03: 10.7 via INTRAVENOUS
  Filled 2020-11-03: qty 11

## 2020-11-03 MED ORDER — TECHNETIUM TC 99M TETROFOSMIN IV KIT
31.8000 | PACK | Freq: Once | INTRAVENOUS | Status: AC | PRN
  Administered 2020-11-03: 31.8 via INTRAVENOUS
  Filled 2020-11-03: qty 32

## 2020-11-03 MED ORDER — REGADENOSON 0.4 MG/5ML IV SOLN
0.4000 mg | Freq: Once | INTRAVENOUS | Status: AC
  Administered 2020-11-03: 0.4 mg via INTRAVENOUS

## 2020-11-03 MED ORDER — PERFLUTREN LIPID MICROSPHERE
1.0000 mL | INTRAVENOUS | Status: AC | PRN
  Administered 2020-11-03: 2 mL via INTRAVENOUS

## 2020-11-22 ENCOUNTER — Other Ambulatory Visit: Payer: Self-pay

## 2020-11-22 ENCOUNTER — Encounter: Payer: Self-pay | Admitting: Physician Assistant

## 2020-11-22 ENCOUNTER — Ambulatory Visit: Payer: Medicare Other | Admitting: Physician Assistant

## 2020-11-22 VITALS — BP 140/72 | HR 68 | Ht 64.0 in | Wt 175.8 lb

## 2020-11-22 DIAGNOSIS — E039 Hypothyroidism, unspecified: Secondary | ICD-10-CM | POA: Diagnosis not present

## 2020-11-22 DIAGNOSIS — E119 Type 2 diabetes mellitus without complications: Secondary | ICD-10-CM | POA: Diagnosis not present

## 2020-11-22 DIAGNOSIS — E785 Hyperlipidemia, unspecified: Secondary | ICD-10-CM

## 2020-11-22 DIAGNOSIS — R0609 Other forms of dyspnea: Secondary | ICD-10-CM

## 2020-11-22 DIAGNOSIS — R0600 Dyspnea, unspecified: Secondary | ICD-10-CM

## 2020-11-22 DIAGNOSIS — I771 Stricture of artery: Secondary | ICD-10-CM

## 2020-11-22 DIAGNOSIS — I10 Essential (primary) hypertension: Secondary | ICD-10-CM

## 2020-11-22 NOTE — Progress Notes (Signed)
Cardiology Office Note:    Date:  11/24/2020   ID:  Victoria Hess, DOB 11/20/1945, MRN [id number]  PCP:  Hess, Victoria J, MD   [facility] Providers Cardiologist:  Victoria Croitoru, MD     Referring MD: Hess, Victoria J, MD   Chief Complaint  Patient presents with   Follow-up    Seen for Dr. Croitoru     History of Present Illness:    Victoria Hess is a 75 y.o. 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  Patient was last seen by Dr. Croitoru on 10/08/2020 for dyspnea on exertion and chest discomfort.  She had just had COVID-19 infection however the infection did not worsen her dyspnea.  Carotid Doppler, echocardiogram and Myoview were recommended.  Carotid Doppler obtained on 10/25/2020 showed no significant carotid artery disease, left subclavian artery stenosis was noted.  Echocardiogram obtained on 11/03/2020 showed EF 65 to 70%, trivial MR.  Myoview obtained on 11/03/2020 showed EF 63%, overall low risk study without significant sign of ischemia.   Patient presents today for follow-up.  Right arm pressure was 150/78, left arm pressure was 87/70.  She says her blood pressure is typically in the 120s at home however she did not get much sleep last night.  She says she still have intermittent shortness of breath that is not necessarily correlated with exertion.  Sometimes she could be talking on the phone when she suddenly started having shortness of breath for few seconds before going away.  At this time given reassuring echocardiogram and Myoview, I do not recommend any further work-up.  She does not have any significant left arm weakness when she is lifting her left arm and reaching for things.  Given the asymptomatic nature, I recommended continue observation.  She is aware that if she started having increasing left-sided weakness, we can  potentially refer her to vascular surgery to consider stenting of subclavian artery stenosis.  She is also aware to never obtain blood pressure on the left side as it is not accurate.  Past Medical History:  Diagnosis Date   Breast cancer (HCC)    Cancer (HCC) 2002,2011   Stage 2 breast cancer bil   Diabetes mellitus without complication (HCC)    Family history of breast cancer    Family history of stomach cancer    Hyperlipidemia    Hypertension    Personal history of radiation therapy 2002,2011   Thyroid disease    hypothyroidism   Wears dentures    partial   Wears glasses     Past Surgical History:  Procedure Laterality Date   APPENDECTOMY     BREAST LUMPECTOMY Right 2002   BREAST LUMPECTOMY Left 03/07/2010   BREAST SURGERY     left lumpectomy sentinel  node biopsy   BREAST SURGERY     right lumpectomy   COLONOSCOPY  2008   at [facility], "normal" exam   TUBAL LIGATION      Current Medications: Current Meds  Medication Sig   amLODipine (NORVASC) 10 MG tablet TAKE 1 TABLET BY MOUTH  DAILY   aspirin 81 MG tablet Take 81 mg by mouth daily.   Blood Glucose Monitoring Suppl (ONE TOUCH ULTRA SYSTEM KIT) W/DEVICE KIT 1 kit by Does not apply route once. Test bid.  DX E11.9   BYSTOLIC 20 MG TABS TAKE 1 TABLET BY MOUTH  DAILY     Calcium Carbonate (CALCIUM 600 PO) Take by mouth daily.   Cholecalciferol (VITAMIN D PO) Take 2,000 mg by mouth daily.   furosemide (LASIX) 40 MG tablet Take 1 tablet (40 mg total) by mouth daily.   glucose blood (BAYER CONTOUR NEXT TEST) test strip Use as instructed   IRON PO Take by mouth 2 (two) times daily.   Lancets (ONETOUCH ULTRASOFT) lancets Test BID.  DX E11.9   levothyroxine (SYNTHROID) 50 MCG tablet TAKE 1 TABLET BY MOUTH  DAILY   metFORMIN (GLUCOPHAGE) 500 MG tablet TAKE 1 TABLET BY MOUTH  TWICE DAILY WITH MEALS   Multiple Vitamin (MULTIVITAMIN PO) Take by mouth daily.   potassium chloride SA (KLOR-CON) 20 MEQ tablet TAKE 1 TABLET BY MOUTH   TWICE DAILY   simvastatin (ZOCOR) 10 MG tablet TAKE 1 TABLET BY MOUTH AT  BEDTIME     Allergies:   Irbesartan   Social History   Socioeconomic History   Marital status: Married    Spouse name: Not on file   Number of children: Not on file   Years of education: Not on file   Highest education level: Not on file  Occupational History   Not on file  Tobacco Use   Smoking status: Never   Smokeless tobacco: Never  Vaping Use   Vaping Use: Never used  Substance and Sexual Activity   Alcohol use: No   Drug use: No   Sexual activity: Not Currently    Birth control/protection: Post-menopausal  Other Topics Concern   Not on file  Social History Narrative   Not on file   Social Determinants of Health   Financial Resource Strain: Not on file  Food Insecurity: Not on file  Transportation Needs: Not on file  Physical Activity: Not on file  Stress: Not on file  Social Connections: Not on file     Family History: The patient's family history includes Breast cancer in her cousin; Breast cancer (age of onset: 45) in her sister; Breast cancer (age of onset: 80) in her maternal aunt; Cancer in her brother; Diabetes in her father, mother, and sister; Heart disease in her mother; Hepatitis C in her brother; Hyperlipidemia in her sister; Iron deficiency in her father, mother, sister, and sister; Stomach cancer (age of onset: 64) in her father. There is no history of Colon cancer.  ROS:   Please see the history of present illness.     All other systems reviewed and are negative.  EKGs/Labs/Other Studies Reviewed:    The following studies were reviewed today:  Echo 11/03/2020  1. Left ventricular ejection fraction, by estimation, is 65 to 70%. The  left ventricle has normal function. The left ventricle has no regional  wall motion abnormalities. Left ventricular diastolic parameters were  normal.   2. Right ventricular systolic function is normal. The right ventricular  size is  normal. Tricuspid regurgitation signal is inadequate for assessing  PA pressure.   3. The mitral valve is normal in structure. Trivial mitral valve  regurgitation. No evidence of mitral stenosis.   4. The aortic valve is tricuspid. Aortic valve regurgitation is not  visualized. No aortic stenosis is present.   5. The inferior vena cava is normal in size with greater than 50%  respiratory variability, suggesting right atrial pressure of 3 mmHg.     Myoview 11/03/2020 The left ventricular ejection fraction is normal (55-65%). Nuclear stress EF: 63%. There was no ST segment deviation noted during stress. No T wave inversion was   noted during stress. The study is normal. This is a low risk study.     EKG:  EKG is not ordered today.    Recent Labs: 02/24/2020: BUN 19; Creat 0.89; Hemoglobin 11.7; Platelets 252; Potassium 5.3; Sodium 140 08/31/2020: ALT 13; TSH 3.27  Recent Lipid Panel    Component Value Date/Time   CHOL 200 (H) 08/31/2020 0941   TRIG 82 08/31/2020 0941   HDL 97 08/31/2020 0941   CHOLHDL 2.1 08/31/2020 0941   VLDL 9 11/10/2016 1103   LDLCALC 86 08/31/2020 0941     Risk Assessment/Calculations:           Physical Exam:    VS:  BP 140/72   Pulse 68   Ht 5' 4" (1.626 m)   Wt 175 lb 12.8 oz (79.7 kg)   SpO2 97%   BMI 30.18 kg/m     Wt Readings from Last 3 Encounters:  11/22/20 175 lb 12.8 oz (79.7 kg)  11/03/20 179 lb (81.2 kg)  10/08/20 179 lb (81.2 kg)     GEN:  Well nourished, well developed in no acute distress HEENT: Normal NECK: No JVD; No carotid bruits LYMPHATICS: No lymphadenopathy CARDIAC: RRR, no murmurs, rubs, gallops RESPIRATORY:  Clear to auscultation without rales, wheezing or rhonchi  ABDOMEN: Soft, non-tender, non-distended MUSCULOSKELETAL:  No edema; No deformity  SKIN: Warm and dry NEUROLOGIC:  Alert and oriented x 3 PSYCHIATRIC:  Normal affect   ASSESSMENT:    1. DOE (dyspnea on exertion)   2. Hypertension, unspecified  type   3. Hyperlipidemia LDL goal <70   4. Controlled type 2 diabetes mellitus without complication, without long-term current use of insulin (HCC)   5. Hypothyroidism, unspecified type   6. Subclavian artery stenosis, left (HCC)    PLAN:    In order of problems listed above:  Dyspnea on exertion: Recent echocardiogram and Myoview were reassuring.  Continue observation  Hypertension: Blood pressure mildly elevated on the right side today, however it is normal at home.  Hyperlipidemia: Continue simvastatin  DM2: Managed by primary care provider  Hypothyroidism: On levothyroxine  Left subclavian artery stenosis: There is a noticeable drop in the blood pressure on the left side when compared to the right side.  Obtain blood pressure in the right arm only.  She is asymptomatic without any weakness of the left arm, continue observation.        Medication Adjustments/Labs and Tests Ordered: Current medicines are reviewed at length with the patient today.  Concerns regarding medicines are outlined above.  No orders of the defined types were placed in this encounter.  No orders of the defined types were placed in this encounter.   Patient Instructions  Medication Instructions:  Your physician recommends that you continue on your current medications as directed. Please refer to the Current Medication list given to you today.  *If you need a refill on your cardiac medications before your next appointment, please call your pharmacy*  Lab Work: NONE ordered at this time of appointment   If you have labs (blood work) drawn today and your tests are completely normal, you will receive your results only by: MyChart Message (if you have MyChart) OR A paper copy in the mail If you have any lab test that is abnormal or we need to change your treatment, we will call you to review the results.  Testing/Procedures: NONE ordered at this time of appointment   Follow-Up: At [facility],  you and your health needs are   our priority.  As part of our continuing mission to provide you with exceptional heart care, we have created designated Provider Care Teams.  These Care Teams include your primary Cardiologist (physician) and Advanced Practice Providers (APPs -  Physician Assistants and Nurse Practitioners) who all work together to provide you with the care you need, when you need it.  We recommend signing up for the patient portal called "MyChart".  Sign up information is provided on this After Visit Summary.  MyChart is used to connect with patients for Virtual Visits (Telemedicine).  Patients are able to view lab/test results, encounter notes, upcoming appointments, etc.  Non-urgent messages can be sent to your provider as well.   To learn more about what you can do with MyChart, go to [email or web address].    Your next appointment:   4-5 month(s)  The format for your next appointment:   In Person  Provider:   Mihai Croitoru, MD  Other Instructions IF you experience any left arm weakness give our office a call.    Signed, Hao Meng, PA  11/24/2020 11:03 PM    [facility]  

## 2020-11-22 NOTE — Patient Instructions (Signed)
Medication Instructions:  Your physician recommends that you continue on your current medications as directed. Please refer to the Current Medication list given to you today.  *If you need a refill on your cardiac medications before your next appointment, please call your pharmacy*  Lab Work: NONE ordered at this time of appointment   If you have labs (blood work) drawn today and your tests are completely normal, you will receive your results only by: [place] (if you have MyChart) OR A paper copy in the mail If you have any lab test that is abnormal or we need to change your treatment, we will call you to review the results.  Testing/Procedures: NONE ordered at this time of appointment   Follow-Up: At [facility], you and your health needs are our priority.  As part of our continuing mission to provide you with exceptional heart care, we have created designated Provider Care Teams.  These Care Teams include your primary Cardiologist (physician) and Advanced Practice Providers (APPs -  Physician Assistants and Nurse Practitioners) who all work together to provide you with the care you need, when you need it.  We recommend signing up for the patient portal called "MyChart".  Sign up information is provided on this After Visit Summary.  MyChart is used to connect with patients for Virtual Visits (Telemedicine).  Patients are able to view lab/test results, encounter notes, upcoming appointments, etc.  Non-urgent messages can be sent to your provider as well.   To learn more about what you can do with MyChart, go to [email or web address].    Your next appointment:   4-5 month(s)  The format for your next appointment:   In Person  Provider:   Sanda Klein, MD  Other Instructions IF you experience any left arm weakness give our office a call.

## 2020-11-24 ENCOUNTER — Encounter: Payer: Self-pay | Admitting: Physician Assistant

## 2021-01-10 ENCOUNTER — Other Ambulatory Visit: Payer: Self-pay | Admitting: Internal Medicine

## 2021-01-24 ENCOUNTER — Other Ambulatory Visit: Payer: Self-pay | Admitting: Internal Medicine

## 2021-01-24 DIAGNOSIS — Z1231 Encounter for screening mammogram for malignant neoplasm of breast: Secondary | ICD-10-CM

## 2021-02-02 ENCOUNTER — Ambulatory Visit
Admission: RE | Admit: 2021-02-02 | Discharge: 2021-02-02 | Disposition: A | Discharge status: Home / Self Care | Payer: Medicare Other | Source: Ambulatory Visit | Attending: Internal Medicine | Admitting: Internal Medicine

## 2021-02-02 ENCOUNTER — Other Ambulatory Visit: Payer: Self-pay

## 2021-02-02 DIAGNOSIS — Z780 Asymptomatic menopausal state: Secondary | ICD-10-CM | POA: Diagnosis not present

## 2021-02-12 ENCOUNTER — Other Ambulatory Visit: Payer: Self-pay | Admitting: Internal Medicine

## 2021-03-03 ENCOUNTER — Other Ambulatory Visit: Payer: Self-pay

## 2021-03-03 ENCOUNTER — Ambulatory Visit
Admission: RE | Admit: 2021-03-03 | Discharge: 2021-03-03 | Disposition: A | Discharge status: Home / Self Care | Payer: Medicare Other | Source: Ambulatory Visit | Attending: Internal Medicine | Admitting: Internal Medicine

## 2021-03-03 DIAGNOSIS — Z1231 Encounter for screening mammogram for malignant neoplasm of breast: Secondary | ICD-10-CM

## 2021-03-04 ENCOUNTER — Other Ambulatory Visit: Payer: Medicare Other | Admitting: Internal Medicine

## 2021-03-04 DIAGNOSIS — E1169 Type 2 diabetes mellitus with other specified complication: Secondary | ICD-10-CM | POA: Diagnosis not present

## 2021-03-04 DIAGNOSIS — I10 Essential (primary) hypertension: Secondary | ICD-10-CM | POA: Diagnosis not present

## 2021-03-04 DIAGNOSIS — D649 Anemia, unspecified: Secondary | ICD-10-CM

## 2021-03-04 DIAGNOSIS — E039 Hypothyroidism, unspecified: Secondary | ICD-10-CM

## 2021-03-04 DIAGNOSIS — E785 Hyperlipidemia, unspecified: Secondary | ICD-10-CM | POA: Diagnosis not present

## 2021-03-04 DIAGNOSIS — Z0000 Encounter for general adult medical examination without abnormal findings: Secondary | ICD-10-CM

## 2021-03-05 LAB — COMPLETE METABOLIC PANEL WITH GFR
AG Ratio: 1.5 (calc) (ref 1.0–2.5)
ALT: 8 U/L (ref 6–29)
AST: 14 U/L (ref 10–35)
Albumin: 4.3 g/dL (ref 3.6–5.1)
Alkaline phosphatase (APISO): 84 U/L (ref 37–153)
BUN: 15 mg/dL (ref 7–25)
CO2: 30 mmol/L (ref 20–32)
Calcium: 9.9 mg/dL (ref 8.6–10.4)
Chloride: 103 mmol/L (ref 98–110)
Creat: 0.81 mg/dL (ref 0.60–1.00)
Globulin: 2.9 g/dL (calc) (ref 1.9–3.7)
Glucose, Bld: 97 mg/dL (ref 65–99)
Potassium: 4.8 mmol/L (ref 3.5–5.3)
Sodium: 141 mmol/L (ref 135–146)
Total Bilirubin: 0.3 mg/dL (ref 0.2–1.2)
Total Protein: 7.2 g/dL (ref 6.1–8.1)
eGFR: 76 mL/min/{1.73_m2} (ref >=60)

## 2021-03-05 LAB — IRON,TIBC AND FERRITIN PANEL
%SAT: 20 % (calc) (ref 16–45)
Ferritin: 59 ng/mL (ref 16–288)
Iron: 63 ug/dL (ref 45–160)
TIBC: 311 mcg/dL (calc) (ref 250–450)

## 2021-03-05 LAB — CBC WITH DIFFERENTIAL/PLATELET
Absolute Monocytes: 672 cells/uL (ref 200–950)
Basophils Absolute: 34 cells/uL (ref 0–200)
Basophils Relative: 0.4 %
Eosinophils Absolute: 160 cells/uL (ref 15–500)
Eosinophils Relative: 1.9 %
HCT: 35.6 % (ref 35.0–45.0)
Hemoglobin: 11.5 g/dL — ABNORMAL LOW (ref 11.7–15.5)
Lymphs Abs: 1882 cells/uL (ref 850–3900)
MCH: 27.4 pg (ref 27.0–33.0)
MCHC: 32.3 g/dL (ref 32.0–36.0)
MCV: 85 fL (ref 80.0–100.0)
MPV: 13 fL — ABNORMAL HIGH (ref 7.5–12.5)
Monocytes Relative: 8 %
Neutro Abs: 5653 cells/uL (ref 1500–7800)
Neutrophils Relative %: 67.3 %
Platelets: 284 10*3/uL (ref 140–400)
RBC: 4.19 10*6/uL (ref 3.80–5.10)
RDW: 14.1 % (ref 11.0–15.0)
Total Lymphocyte: 22.4 %
WBC: 8.4 10*3/uL (ref 3.8–10.8)

## 2021-03-05 LAB — TSH: TSH: 2.22 mIU/L (ref 0.40–4.50)

## 2021-03-05 LAB — LIPID PANEL
Cholesterol: 178 mg/dL (ref <200)
HDL: 80 mg/dL (ref >=50)
LDL Cholesterol (Calc): 82 mg/dL (calc)
Non-HDL Cholesterol (Calc): 98 mg/dL (calc) (ref <130)
Total CHOL/HDL Ratio: 2.2 (calc) (ref <5.0)
Triglycerides: 80 mg/dL (ref <150)

## 2021-03-08 ENCOUNTER — Other Ambulatory Visit: Payer: Self-pay

## 2021-03-08 ENCOUNTER — Encounter: Payer: Self-pay | Admitting: Internal Medicine

## 2021-03-08 ENCOUNTER — Ambulatory Visit (INDEPENDENT_AMBULATORY_CARE_PROVIDER_SITE_OTHER): Payer: Medicare Other | Admitting: Internal Medicine

## 2021-03-08 VITALS — BP 134/88 | HR 66 | Temp 98.9°F | Ht 63.0 in | Wt 174.0 lb

## 2021-03-08 DIAGNOSIS — Z1211 Encounter for screening for malignant neoplasm of colon: Secondary | ICD-10-CM

## 2021-03-08 DIAGNOSIS — Z853 Personal history of malignant neoplasm of breast: Secondary | ICD-10-CM

## 2021-03-08 DIAGNOSIS — E785 Hyperlipidemia, unspecified: Secondary | ICD-10-CM

## 2021-03-08 DIAGNOSIS — Z0000 Encounter for general adult medical examination without abnormal findings: Secondary | ICD-10-CM | POA: Diagnosis not present

## 2021-03-08 DIAGNOSIS — I10 Essential (primary) hypertension: Secondary | ICD-10-CM

## 2021-03-08 DIAGNOSIS — E1169 Type 2 diabetes mellitus with other specified complication: Secondary | ICD-10-CM | POA: Diagnosis not present

## 2021-03-08 DIAGNOSIS — E8881 Metabolic syndrome: Secondary | ICD-10-CM

## 2021-03-08 DIAGNOSIS — E039 Hypothyroidism, unspecified: Secondary | ICD-10-CM | POA: Diagnosis not present

## 2021-03-08 DIAGNOSIS — Z6830 Body mass index (BMI) 30.0-30.9, adult: Secondary | ICD-10-CM

## 2021-03-08 LAB — POCT URINALYSIS DIPSTICK
Blood, UA: NEGATIVE
Glucose, UA: NEGATIVE
Ketones, UA: NEGATIVE
Leukocytes, UA: NEGATIVE
Nitrite, UA: NEGATIVE
Protein, UA: NEGATIVE
Spec Grav, UA: <=1.005 — AB (ref 1.010–1.025)
Urobilinogen, UA: 0.2 E.U./dL
pH, UA: 5 (ref 5.0–8.0)

## 2021-03-08 NOTE — Progress Notes (Deleted)
IElby Showers, MD, have reviewed all documentation for this visit. The documentation on 04/13/21 for the exam, diagnosis, procedures, and orders are all accurate and complete.     Annual Wellness Visit     Patient: Victoria Hess, Female    DOB: Nov 27, 1945, 75 y.o.   MRN: [id number] Visit Date: 03/08/2021  Today's Provider: Elby Showers, MD   Chief Complaint  Patient presents with   Medicare Wellness   Annual Exam   Subjective    Victoria Hess is a 75 y.o. female who presents today for her Annual Wellness Visit. She reports consuming a low fat diet. The patient does not participate in regular exercise at present.   She generally feels fairly well. She reports sleeping fairly well. She does have additional problems to discuss today.   HPI She has a history of hypertension, hypothyroidism, controlled type 2 diabetes mellitus, obesity, hyperlipidemia, metabolic syndrome and history of bilateral ductal carcinoma in situ.  She had wide excision procedure of the left breast in 2011 and a lesser procedure done on her right breast in 2002.  She has had no recurrence.  Tonsillectomy and adenoidectomy 1955.  Appendectomy and tubal ligation 1977.  History of uterine fibroids, cystocele, uterine prolapse.  Social history: She lives in [place].  Does not smoke or consume alcohol.  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  Family history: Father died at age 4 of a stroke.  Mother died from coronary disease.  1 brother with history of hypertension and diabetes.  1 sister with hypertension and diabetes.  Another sister with hypertension.  1 sister in good health without medical issues.  In April, was seen for shortness of breath at 11-monthfollow-up.  She was worried about her heart.  Had noticed an episode twice of problems catching her breath.  She had gained 8 pounds since October 2021.  She used to walk a  lot but had episodes of shortness of breath and has been afraid to walk alone.  Chest x-ray at that time showed no acute abnormalities on chest x-ray her pulse oximetry was normal on room air.  Was noted to have risk factors for heart disease including diabetes, family history of stroke and hyperlipidemia.  Was referred to Cardiology.  Was noted to have left carotid bruit.  Vascular ultrasound of carotids showed normal carotids but was found to have left subclavian stenosis which likely was causing turbulence and sounding like a carotid bruit on examination of left carotid.  Echocardiogram was normal including systolic and diastolic function and no valvular disease was noted.  Myocardial perfusion imaging was low risk with no evidence of ischemia and normal left ventricular function.  She has follow-up Cardiology appointment in November.  Cologuard ordered for colon cancer screening.  Lipid panel is normal.  TSH is normal on thyroid replacement medication.  Electrolytes and liver functions are normal.  Creatinine is normal.  She has a remote history of iron deficiency in 2020 but iron level is currently normal.  Medications: Outpatient Medications Prior to Visit  Medication Sig   amLODipine (NORVASC) 10 MG tablet TAKE 1 TABLET BY MOUTH  DAILY   aspirin 81 MG tablet Take 81 mg by mouth daily.   Blood Glucose Monitoring Suppl (ONE TOUCH ULTRA SYSTEM KIT) W/DEVICE KIT 1 kit by Does not apply route once. Test bid.  DX E11.9   Calcium Carbonate (CALCIUM 600 PO) Take  by mouth daily.   Cholecalciferol (VITAMIN D PO) Take 2,000 mg by mouth daily.   furosemide (LASIX) 40 MG tablet Take 1 tablet (40 mg total) by mouth daily.   glucose blood (BAYER CONTOUR NEXT TEST) test strip Use as instructed   IRON PO Take by mouth daily.   Lancets (ONETOUCH ULTRASOFT) lancets Test BID.  DX E11.9   levothyroxine (SYNTHROID) 50 MCG tablet TAKE 1 TABLET BY MOUTH  DAILY   metFORMIN (GLUCOPHAGE) 500 MG tablet TAKE 1 TABLET  BY MOUTH  TWICE DAILY WITH MEALS   Multiple Vitamin (MULTIVITAMIN PO) Take by mouth daily.   Nebivolol HCl 20 MG TABS TAKE 1 TABLET BY MOUTH  DAILY   potassium chloride SA (KLOR-CON) 20 MEQ tablet TAKE 1 TABLET BY MOUTH  TWICE DAILY   simvastatin (ZOCOR) 10 MG tablet TAKE 1 TABLET BY MOUTH AT  BEDTIME   No facility-administered medications prior to visit.    Allergies  Allergen Reactions   Irbesartan Cough and Other (See Comments)    Other Reaction: CONGESTION    Patient Care Team: Elby Showers, MD as PCP - General (Internal Medicine) Croitoru, Dani Gobble, MD as PCP - Cardiology (Cardiology)  Review of Systems  see above  {Labs  Heme  Chem  Endocrine  Serology  Results Review (optional):23779}    Objective    Vitals: BP 134/88   Pulse 66   Temp 98.9 F (37.2 C) (Tympanic)   Ht '5\' 3"'  (1.6 m)   Wt 174 lb (78.9 kg)   SpO2 99%   BMI 30.82 kg/m  {Show previous vital signs (optional):23777}  Physical Exam Constitutional:      Appearance: Normal appearance.  HENT:     Head: Normocephalic and atraumatic.     Right Ear: Tympanic membrane normal.     Left Ear: Tympanic membrane normal.     Nose: Nose normal.  Eyes:     General:        Right eye: No discharge.        Left eye: No discharge.     Extraocular Movements: Extraocular movements intact.     Pupils: Pupils are equal, round, and reactive to light.  Cardiovascular:     Rate and Rhythm: Normal rate and regular rhythm.     Comments: II/VI SEM transmitted to left carotid Pulmonary:     Breath sounds: Normal breath sounds.  Abdominal:     General: Bowel sounds are normal.     Palpations: Abdomen is soft. There is no mass.     Tenderness: There is no abdominal tenderness. There is no rebound.  Genitourinary:    Comments: Vaginal prolapse. Bimanual normal. No stool to guaiac Musculoskeletal:     Cervical back: Neck supple. No rigidity.     Right lower leg: Edema present.     Left lower leg: Edema present.   Lymphadenopathy:     Cervical: No cervical adenopathy.  Skin:    General: Skin is warm and dry.  Neurological:     General: No focal deficit present.     Mental Status: She is alert and oriented to person, place, and time.     Cranial Nerves: No cranial nerve deficit.     Motor: No weakness.     Coordination: Coordination normal.     Gait: Gait normal.  Psychiatric:        Mood and Affect: Mood normal.        Behavior: Behavior normal.  Thought Content: Thought content normal.        Judgment: Judgment normal.   ***I, Elby Showers, MD, have reviewed all documentation for this visit. The documentation on 04/13/21 for the exam, diagnosis, procedures, and orders are all accurate and complete.   Most recent functional status assessment: In your present state of health, do you have any difficulty performing the following activities: 03/08/2021  Hearing? N  Vision? N  Difficulty concentrating or making decisions? N  Walking or climbing stairs? N  Dressing or bathing? N  Doing errands, shopping? N  Preparing Food and eating ? -  Using the Toilet? -  In the past six months, have you accidently leaked urine? -  Do you have problems with loss of bowel control? -  Managing your Medications? -  Managing your Finances? -  Housekeeping or managing your Housekeeping? -  Some recent data might be hidden   Most recent fall risk assessment: Fall Risk  03/08/2021  Falls in the past year? 0  Comment -  Number falls in past yr: 0  Injury with Fall? 0  Risk for fall due to : No Fall Risks  Follow up Falls evaluation completed    Most recent depression screenings: PHQ 2/9 Scores 03/08/2021 02/26/2020  PHQ - 2 Score 0 1   Most recent cognitive screening: 6CIT Screen 03/08/2021  What Year? 0 points  What month? 0 points  What time? 0 points  Count back from 20 0 points  Months in reverse 0 points  Repeat phrase 0 points  Total Score 0   Most recent Audit-C alcohol use  screening Alcohol Use Disorder Test (AUDIT) 08/25/2019  1. How often do you have a drink containing alcohol? 0  2. How many drinks containing alcohol do you have on a typical day when you are drinking? 0  3. How often do you have six or more drinks on one occasion? 0  AUDIT-C Score 0  Alcohol Brief Interventions/Follow-up AUDIT Score <7 follow-up not indicated   A score of 3 or more in women, and 4 or more in men indicates increased risk for alcohol abuse, EXCEPT if all of the points are from question 1   Results for orders placed or performed in visit on 03/08/21  POCT urinalysis dipstick  Result Value Ref Range   Color, UA straw    Clarity, UA clear    Glucose, UA Negative Negative   Bilirubin, UA small    Ketones, UA neg    Spec Grav, UA <=1.005 (A) 1.010 - 1.025   Blood, UA neg    pH, UA 5.0 5.0 - 8.0   Protein, UA Negative Negative   Urobilinogen, UA 0.2 0.2 or 1.0 E.U./dL   Nitrite, UA neg    Leukocytes, UA Negative Negative   Appearance     Odor      Assessment & Plan     Annual wellness visit done today including the all of the following: Reviewed patient's Family Medical History Reviewed and updated list of patient's medical providers Assessment of cognitive impairment was done Assessed patient's functional ability Established a written schedule for health screening services Health Risk Assessent Completed and Reviewed  Exercise Activities and Dietary recommendations  Goals   None     Immunization History  Administered Date(s) Administered   Fluad Quad(high Dose 65+) 02/15/2021   Influenza Split 03/15/2012   Influenza, High Dose Seasonal PF 02/06/2018, 02/11/2019   Influenza,inj,Quad PF,6+ Mos 03/06/2013, 02/17/2020   Influenza-Unspecified 02/28/2014,  02/03/2015, 02/13/2017   Moderna Sars-Covid-2 Vaccination 06/23/2019, 07/23/2019, 03/16/2020, 12/21/2020   Pneumococcal Conjugate-13 02/19/2015   Pneumococcal Polysaccharide-23 12/14/2010, 01/03/2011,  06/10/2013   Tdap 12/13/2009    Health Maintenance  Topic Date Due   Hepatitis C Screening  Never done   FOOT EXAM  08/24/2020   OPHTHALMOLOGY EXAM  11/06/2020   COVID-19 Vaccine (5 - Booster for Moderna series) 02/15/2021   URINE MICROALBUMIN  02/23/2021   HEMOGLOBIN A1C  03/02/2021   Zoster Vaccines- Shingrix (1 of 2) 06/08/2021 (Originally 05/29/1964)   TETANUS/TDAP  03/08/2022 (Originally 12/14/2019)   MAMMOGRAM  03/04/2023   COLONOSCOPY (Pts 45-54yr Insurance coverage will need to be confirmed)  01/31/2027   Pneumonia Vaccine 75 Years old  Completed   INFLUENZA VACCINE  Completed   DEXA SCAN  Completed   HPV VACCINES  Aged Out   She will return in 6 months for follow-up.  She will keep cardiology appointment.  Discussed health benefits of physical activity, and encouraged her to engage in regular exercise appropriate for her age      I, MElby Showers MD, have reviewed all documentation for this visit. The documentation on 04/13/21 for the exam, diagnosis, procedures, and orders are all accurate and complete.    MElby Showers MD  [facility] [place](phone) [phone](fax)  [place]

## 2021-03-08 NOTE — Progress Notes (Signed)
Subjective:   Patient presents for Medicare Annual/Subsequent preventive examination.  Risk Factors  Current exercise habits:  Dietary issues discussed:   Cardiac risk factors:advanced age (older than 46 for men, 38 for women), dyslipidemia, hypertension, obesity (BMI >= 30 kg/m2), and sedentary lifestyle  Depression Screen  (Note: if answer to either of the following is "Yes", a more complete depression screening is indicated)   Over the past two weeks, have you felt down, depressed or hopeless? No Over the past two weeks, have you felt little interest or pleasure in doing things? No Have you lost interest or pleasure in daily life? No Do you often feel hopeless? No Do you cry easily over simple problems? No  Activities of Daily Living  In your present state of health, do you have any difficulty performing the following activities?:   Driving? No Managing money? No Feeding yourself? No Getting from bed to chair?No Climbing a flight of stairs?  No Preparing food and eating?:  No Bathing or showering?   No Getting dressed:  No Getting to the toilet?  No Using the toilet:  No Moving around from place to place:  No In the past year have you fallen or had a near fall?  No Are you sexually active?  No Do you have more than one partner?  No  Hearing Difficulties:    Do you often ask people to speak up or repeat themselves?  No Do you experience ringing or noises in your ears?   No Do you have difficulty understanding soft or whispered voices?  No Do you feel that you have a problem with memory?  No  Do you often misplace items?  No   Home Safety:  Do you have a smoke alarm at your residence? Yes Do you have grab bars in the bathroom?  No Do you have throw rugs in your house?   No   Cognitive Testing  Alert? Yes Normal Appearance?Yes  Oriented to person? Yes Place? Yes  Time? Yes  Recall of three objects? Yes  Can perform simple calculations? Yes  Displays  appropriate judgment?Yes  Can read the correct time from a watch face?Yes   List the Names of Other Physician/Practitioners you currently use:  See referral list for the physicians patient is currently seeing.    Patient Instructions (the written plan) was given to the patient.  Medicare Attestation  I have personally reviewed:  The patient's medical and social history  Their use of alcohol, tobacco or illicit drugs  Their current medications and supplements  The patient's functional ability including ADLs,fall risks, home safety risks, cognitive, and hearing and visual impairment  Diet and physical activities  Evidence for depression or mood disorders  The patient's weight, height, BMI, and visual acuity have been recorded in the chart. I have made referrals, counseling, and provided education to the patient based on review of the above and I have provided the patient with a written personalized care plan for preventive services.    75 year old Female for Medicare wellness and health m,aintenance exam.

## 2021-03-09 ENCOUNTER — Telehealth: Payer: Self-pay | Admitting: Internal Medicine

## 2021-03-09 ENCOUNTER — Encounter: Payer: Self-pay | Admitting: Internal Medicine

## 2021-03-09 DIAGNOSIS — Z0289 Encounter for other administrative examinations: Secondary | ICD-10-CM

## 2021-03-09 NOTE — Telephone Encounter (Signed)
Called and let patient know letter was ready for pick up.

## 2021-03-09 NOTE — Telephone Encounter (Signed)
Victoria Hess drooped a letter, where she is needing a letter wrote for her Timeshare  Attention [facility] - MED Personal & Confidential [name], [name]  The letter needs to cover Diagnosis and the long term prognosis and date The letter must be typed and dated The letter must be on the medical office letterhead The letter must be signed by the doctor  *Please do not mention travel and please do not mention the timeshare*  Letter cam be mailed or emailed to [email or web address]

## 2021-03-14 ENCOUNTER — Other Ambulatory Visit: Payer: Self-pay | Admitting: Internal Medicine

## 2021-03-16 DIAGNOSIS — Z1211 Encounter for screening for malignant neoplasm of colon: Secondary | ICD-10-CM | POA: Diagnosis not present

## 2021-03-24 LAB — COLOGUARD: COLOGUARD: NEGATIVE

## 2021-03-25 ENCOUNTER — Encounter: Payer: Self-pay | Admitting: Cardiovascular Disease

## 2021-03-25 ENCOUNTER — Other Ambulatory Visit: Payer: Self-pay

## 2021-03-25 ENCOUNTER — Ambulatory Visit: Payer: Medicare Other | Admitting: Cardiovascular Disease

## 2021-03-25 VITALS — BP 150/81 | HR 71 | Ht 63.5 in | Wt 172.4 lb

## 2021-03-25 DIAGNOSIS — E785 Hyperlipidemia, unspecified: Secondary | ICD-10-CM

## 2021-03-25 DIAGNOSIS — R0609 Other forms of dyspnea: Secondary | ICD-10-CM | POA: Diagnosis not present

## 2021-03-25 DIAGNOSIS — E7800 Pure hypercholesterolemia, unspecified: Secondary | ICD-10-CM

## 2021-03-25 DIAGNOSIS — R072 Precordial pain: Secondary | ICD-10-CM

## 2021-03-25 DIAGNOSIS — R7303 Prediabetes: Secondary | ICD-10-CM | POA: Diagnosis not present

## 2021-03-25 DIAGNOSIS — R011 Cardiac murmur, unspecified: Secondary | ICD-10-CM | POA: Diagnosis not present

## 2021-03-25 DIAGNOSIS — I771 Stricture of artery: Secondary | ICD-10-CM | POA: Diagnosis not present

## 2021-03-25 DIAGNOSIS — I739 Peripheral vascular disease, unspecified: Secondary | ICD-10-CM

## 2021-03-25 DIAGNOSIS — I10 Essential (primary) hypertension: Secondary | ICD-10-CM | POA: Diagnosis not present

## 2021-03-25 MED ORDER — ROSUVASTATIN CALCIUM 10 MG PO TABS: 10.0000 mg | ORAL_TABLET | Freq: Every day | ORAL | 3 refills | Status: DC

## 2021-03-25 MED ORDER — FUROSEMIDE 40 MG PO TABS: 40.0000 mg | ORAL_TABLET | Freq: Every day | ORAL | 3 refills | Status: AC | PRN

## 2021-03-25 MED ORDER — HYDROCHLOROTHIAZIDE 12.5 MG PO CAPS: 12.5000 mg | ORAL_CAPSULE | Freq: Every day | ORAL | 1 refills | Status: DC

## 2021-03-25 NOTE — Progress Notes (Signed)
Cardiology Office Note:    Date:  03/27/2021   ID:  Victoria Hess, DOB 12/04/1945, MRN [id number]  PCP:  Elby Showers, MD   [facility] Providers Cardiologist:  Sanda Klein, MD     Referring MD: Elby Showers, MD   Chief Complaint  Patient presents with   Follow-up    Echo, nuclear stress test and carotid duplex US      History of Present Illness:    Victoria Hess is a 75 y.o. 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  She continues to have complaints of shortness of breath NYHA functional class II (usually has to stop once when climbing a flight of stairs, no dyspnea with toileting/dressing).  She has not had any chest discomfort that sounds like angina pectoris but has some musculoskeletal pain.  Her blood pressure is usually high at home.  She does not like taking the furosemide since this causes excessive urine output.  Echocardiogram shows normal left ventricular systolic function and she also had normal diastolic function based on pretty good mitral annulus diastolic velocities..,  Although the mitral inflow pattern was that of impaired relaxation.  There is no left ventricular hypertrophy.  There were no signs of elevated systolic PA pressure, although the calculation of this was probably not accurate.  There were no significant valvular abnormalities.  The nuclear stress test showed a normal pattern of perfusion and normal LVEF.  Echo looks ultrasound showed no evidence of carotid stenosis, but there was evidence of a very high velocity in the left subclavian artery, consistent with high-grade stenosis.  She has not been checking blood pressures routinely in that limb, since she has previously had breast surgery.  At home blood pressure checked in her right upper extremity is usually in the  130-142 mmHg range.  She has a strong family history of cardiovascular illness, but not of premature onset.  Her mother and father both had strokes and died in their early 63s.  Her brother received a stent when he was 19 years old.  She has never smoked.  Most recent labs from April 2022 showed a hemoglobin A1c of 6.1% and an LDL of 86 on a very low-dose of simvastatin, with an exceptionally high HDL of 97.  She has never had a cardiac catheterization.  Past Medical History:  Diagnosis Date   Breast cancer ([place])    Cancer ([place]) [phone]   Stage 2 breast cancer bil   Diabetes mellitus without complication ([place])    Family history of breast cancer    Family history of stomach cancer    Hyperlipidemia    Hypertension    Personal history of radiation therapy 2002,2011   Thyroid disease    hypothyroidism   Wears dentures    partial   Wears glasses     Past Surgical History:  Procedure Laterality Date   APPENDECTOMY     BREAST LUMPECTOMY Right 2002   BREAST LUMPECTOMY Left 03/07/2010   BREAST SURGERY     left lumpectomy sentinel  node biopsy   BREAST SURGERY     right lumpectomy   COLONOSCOPY  2008   at [place], "normal" exam   TUBAL LIGATION      Current Medications: Current Meds  Medication Sig   amLODipine (NORVASC) 10 MG tablet TAKE 1 TABLET BY MOUTH  DAILY  aspirin 81 MG tablet Take 81 mg by mouth daily.   Blood Glucose Monitoring Suppl (ONE TOUCH ULTRA SYSTEM KIT) W/DEVICE KIT 1 kit by Does not apply route once. Test bid.  DX E11.9   Calcium Carbonate (CALCIUM 600 PO) Take by mouth daily.   Cholecalciferol (VITAMIN D PO) Take 2,000 mg by mouth daily.   glucose blood (BAYER CONTOUR NEXT TEST) test strip Use as instructed   hydrochlorothiazide (MICROZIDE) 12.5 MG capsule Take 1 capsule (12.5 mg total) by mouth daily.   IRON PO Take by mouth daily.   Lancets (ONETOUCH ULTRASOFT) lancets Test BID.  DX E11.9   levothyroxine (SYNTHROID) 50 MCG tablet TAKE 1 TABLET BY MOUTH   DAILY   metFORMIN (GLUCOPHAGE) 500 MG tablet TAKE 1 TABLET BY MOUTH  TWICE DAILY WITH MEALS   Multiple Vitamin (MULTIVITAMIN PO) Take by mouth daily.   Nebivolol HCl 20 MG TABS TAKE 1 TABLET BY MOUTH  DAILY   potassium chloride SA (KLOR-CON) 20 MEQ tablet TAKE 1 TABLET BY MOUTH  TWICE DAILY   rosuvastatin (CRESTOR) 10 MG tablet Take 1 tablet (10 mg total) by mouth daily.   [DISCONTINUED] furosemide (LASIX) 40 MG tablet Take 1 tablet (40 mg total) by mouth daily.   [DISCONTINUED] simvastatin (ZOCOR) 10 MG tablet TAKE 1 TABLET BY MOUTH AT  BEDTIME     Allergies:   Irbesartan   Social History   Socioeconomic History   Marital status: Married    Spouse name: Not on file   Number of children: Not on file   Years of education: Not on file   Highest education level: Not on file  Occupational History   Not on file  Tobacco Use   Smoking status: Never   Smokeless tobacco: Never  Vaping Use   Vaping Use: Never used  Substance and Sexual Activity   Alcohol use: No   Drug use: No   Sexual activity: Not Currently    Birth control/protection: Post-menopausal  Other Topics Concern   Not on file  Social History Narrative   Not on file   Social Determinants of Health   Financial Resource Strain: Not on file  Food Insecurity: Not on file  Transportation Needs: Not on file  Physical Activity: Not on file  Stress: Not on file  Social Connections: Not on file     Family History: The patient's family history includes Breast cancer in her cousin; Breast cancer (age of onset: 68) in her sister; Breast cancer (age of onset: 62) in her maternal aunt; Cancer in her brother; Diabetes in her father, mother, and sister; Heart disease in her mother; Hepatitis C in her brother; Hyperlipidemia in her sister; Iron deficiency in her father, mother, sister, and sister; Stomach cancer (age of onset: 75) in her father. There is no history of Colon cancer.  ROS:   Please see the history of present  illness.     All other systems reviewed and are negative.  EKGs/Labs/Other Studies Reviewed:    The following studies were reviewed today:  EKG:  EKG is not ordered today.  The ekg ordered May 27 demonstrates sinus rhythm with a single PAC.  Questionable voltage criteria for LVH.  There is very delayed R wave progression across anterior precordium with a dominant R wave only in lead V5 and there is T wave inversion leads V1-V3.  QTc is 413 ms.  Recent Labs: 03/04/2021: ALT 8; BUN 15; Creat 0.81; Hemoglobin 11.5; Platelets 284; Potassium 4.8; Sodium 141; TSH  2.22  Recent Lipid Panel    Component Value Date/Time   CHOL 178 03/04/2021 0920   TRIG 80 03/04/2021 0920   HDL 80 03/04/2021 0920   CHOLHDL 2.2 03/04/2021 0920   VLDL 9 11/10/2016 1103   LDLCALC 82 03/04/2021 0920     Risk Assessment/Calculations:       Physical Exam:    VS:  BP (!) 150/81 (BP Location: Right Arm, Patient Position: Sitting, Cuff Size: Large)   Pulse 71   Ht 5' 3.5" (1.613 m)   Wt 172 lb 6.4 oz (78.2 kg)   SpO2 98%   BMI 30.06 kg/m     Wt Readings from Last 3 Encounters:  03/25/21 172 lb 6.4 oz (78.2 kg)  03/08/21 174 lb (78.9 kg)  11/22/20 175 lb 12.8 oz (79.7 kg)     General: Alert, oriented x3, no distress, borderline obese Head: no evidence of trauma, PERRL, EOMI, no exophtalmos or lid lag, no myxedema, no xanthelasma; normal ears, nose and oropharynx Neck: normal jugular venous pulsations and no hepatojugular reflux; brisk carotid pulses without delay and +ve L carotid/subclavian bruit Chest: clear to auscultation, no signs of consolidation by percussion or palpation, normal fremitus, symmetrical and full respiratory excursions Cardiovascular: normal position and quality of the apical impulse, regular rhythm, normal first and second heart sounds, no murmurs, rubs or gallops Abdomen: no tenderness or distention, no masses by palpation, no abnormal pulsatility or arterial bruits, normal bowel  sounds, no hepatosplenomegaly Extremities: no clubbing, cyanosis or edema; 2+ radial, ulnar and brachial pulses bilaterally; 2+ right femoral, posterior tibial and dorsalis pedis pulses; 2+ left femoral, posterior tibial and dorsalis pedis pulses; no subclavian or femoral bruits Neurological: grossly nonfocal Psych: Normal mood and affect   ASSESSMENT:    1. Precordial pain   2. DOE (dyspnea on exertion)   3. Essential hypertension   4. Prediabetes   5. Hypercholesterolemia   6. Murmur, cardiac   7. PAD (peripheral artery disease) ([place])   8. Stenosis of left subclavian artery (HCC)   9. Hyperlipidemia LDL goal <70     PLAN:    In order of problems listed above:  Chest discomfort: No recent complaints of angina pectoris, although some suspicious episodes occurred more than a year ago.  Normal nuclear perfusion pattern on recent test.  The focus is on treatment of her multiple coronary risk factors.  Invasive evaluation with cardiac catheterization does not appear indicated at this time. Dyspnea: Echo shows normal left ventricular systolic function and surprisingly good diastolic function.  No clear cardiac etiology for her shortness of breath. HTN: Systolic blood pressure is high.  We will switch from furosemide to hydrochlorothiazide.  If this leads to worsening shortness of breath and I think we will have to commit to right and left heart catheterization to clarify the cause of her dyspnea.  Reminded her of the importance of a sodium restricted diet. PreDM: Well-controlled with lifestyle changes. HLP: With the identification of PAD, target LDL is less than 70.  Would avoid increasing the simvastatin dose since she is on amlodipine.  We will switch to rosuvastatin.  Note exceptional HDL. 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  No serious valvular abnormalities on echo. PAD/Left subclavian stenosis: What I thought was a carotid bruit actually subclavian bruit.  Blood pressure is  being checked in the right arm anyway because of her previous right breast surgery.  She does not have claudication or vertebral steal syndrome symptoms.  No revascularization necessary  at this time.        Medication Adjustments/Labs and Tests Ordered: Current medicines are reviewed at length with the patient today.  Concerns regarding medicines are outlined above.  Orders Placed This Encounter  Procedures   Lipid panel    Meds ordered this encounter  Medications   furosemide (LASIX) 40 MG tablet    Sig: Take 1 tablet (40 mg total) by mouth daily as needed for edema.    Dispense:  30 tablet    Refill:  3   rosuvastatin (CRESTOR) 10 MG tablet    Sig: Take 1 tablet (10 mg total) by mouth daily.    Dispense:  90 tablet    Refill:  3   hydrochlorothiazide (MICROZIDE) 12.5 MG capsule    Sig: Take 1 capsule (12.5 mg total) by mouth daily.    Dispense:  90 capsule    Refill:  1     Patient Instructions  Medication Instructions:  STOP the Simvastatin  START the Rosuvastatin 10 mg once daily START Hydrochlorothiazide 12.5 mg once daily  Furosemide: take as needed for edema  *If you need a refill on your cardiac medications before your next appointment, please call your pharmacy*   Lab Work: Your provider would like for you to return in 4 months to have the following labs drawn: fasting Lipid. You do not need an appointment for the lab. Once in our office lobby there is a podium where you can sign in and ring the doorbell to alert [place] that you are here. The lab is open from 8:00 am to 4:30 pm; closed for lunch from 12:45pm-1:45pm.  If you have labs (blood work) drawn today and your tests are completely normal, you will receive your results only by: [place] (if you have MyChart) OR A paper copy in the mail If you have any lab test that is abnormal or we need to change your treatment, we will call you to review the results.   Testing/Procedures: None  ordered   Follow-Up: At [facility], you and your health needs are our priority.  As part of our continuing mission to provide you with exceptional heart care, we have created designated Provider Care Teams.  These Care Teams include your primary Cardiologist (physician) and Advanced Practice Providers (APPs -  Physician Assistants and Nurse Practitioners) who all work together to provide you with the care you need, when you need it.  We recommend signing up for the patient portal called "MyChart".  Sign up information is provided on this After Visit Summary.  MyChart is used to connect with patients for Virtual Visits (Telemedicine).  Patients are able to view lab/test results, encounter notes, upcoming appointments, etc.  Non-urgent messages can be sent to your provider as well.   To learn more about what you can do with MyChart, go to [email or web address].    Your next appointment:   5 month(s)  The format for your next appointment:   In Person  Provider:   Sanda Klein, MD      Signed, Sanda Klein, MD  03/27/2021 11:57 AM    [place]

## 2021-03-25 NOTE — Patient Instructions (Addendum)
Medication Instructions:  STOP the Simvastatin  START the Rosuvastatin 10 mg once daily START Hydrochlorothiazide 12.5 mg once daily  Furosemide: take as needed for edema  *If you need a refill on your cardiac medications before your next appointment, please call your pharmacy*   Lab Work: Your provider would like for you to return in 4 months to have the following labs drawn: fasting Lipid. You do not need an appointment for the lab. Once in our office lobby there is a podium where you can sign in and ring the doorbell to alert [place] that you are here. The lab is open from 8:00 am to 4:30 pm; closed for lunch from 12:45pm-1:45pm.  If you have labs (blood work) drawn today and your tests are completely normal, you will receive your results only by: [place] (if you have MyChart) OR A paper copy in the mail If you have any lab test that is abnormal or we need to change your treatment, we will call you to review the results.   Testing/Procedures: None ordered   Follow-Up: At [facility], you and your health needs are our priority.  As part of our continuing mission to provide you with exceptional heart care, we have created designated Provider Care Teams.  These Care Teams include your primary Cardiologist (physician) and Advanced Practice Providers (APPs -  Physician Assistants and Nurse Practitioners) who all work together to provide you with the care you need, when you need it.  We recommend signing up for the patient portal called "MyChart".  Sign up information is provided on this After Visit Summary.  MyChart is used to connect with patients for Virtual Visits (Telemedicine).  Patients are able to view lab/test results, encounter notes, upcoming appointments, etc.  Non-urgent messages can be sent to your provider as well.   To learn more about what you can do with MyChart, go to [email or web address].    Your next appointment:   5 month(s)  The format for your next  appointment:   In Person  Provider:   Sanda Klein, MD

## 2021-03-27 ENCOUNTER — Encounter: Payer: Self-pay | Admitting: Cardiovascular Disease

## 2021-04-13 NOTE — Patient Instructions (Signed)
It was a pleasure to see you today.  Labs are stable.  Keep cardiology appointment in November.  Return in 6 months.

## 2021-06-15 LAB — HM DIABETES EYE EXAM

## 2021-07-11 ENCOUNTER — Other Ambulatory Visit: Payer: Self-pay | Admitting: Cardiovascular Disease

## 2021-07-13 ENCOUNTER — Telehealth: Payer: Self-pay | Admitting: Internal Medicine

## 2021-07-13 NOTE — Telephone Encounter (Signed)
LVM that appointment has changed from 11:45 to 11:30 ?

## 2021-07-13 NOTE — Telephone Encounter (Signed)
Victoria Hess called back to say change was okay with her ?

## 2021-07-19 ENCOUNTER — Other Ambulatory Visit: Payer: Self-pay | Admitting: Internal Medicine

## 2021-09-05 ENCOUNTER — Other Ambulatory Visit: Payer: Medicare Other

## 2021-09-05 DIAGNOSIS — I10 Essential (primary) hypertension: Secondary | ICD-10-CM

## 2021-09-05 DIAGNOSIS — E8881 Metabolic syndrome: Secondary | ICD-10-CM

## 2021-09-05 DIAGNOSIS — E785 Hyperlipidemia, unspecified: Secondary | ICD-10-CM | POA: Diagnosis not present

## 2021-09-05 DIAGNOSIS — E039 Hypothyroidism, unspecified: Secondary | ICD-10-CM | POA: Diagnosis not present

## 2021-09-05 DIAGNOSIS — E1169 Type 2 diabetes mellitus with other specified complication: Secondary | ICD-10-CM

## 2021-09-05 DIAGNOSIS — D619 Aplastic anemia, unspecified: Secondary | ICD-10-CM

## 2021-09-06 ENCOUNTER — Ambulatory Visit (INDEPENDENT_AMBULATORY_CARE_PROVIDER_SITE_OTHER): Payer: Medicare Other | Admitting: Internal Medicine

## 2021-09-06 ENCOUNTER — Encounter: Payer: Self-pay | Admitting: Internal Medicine

## 2021-09-06 VITALS — BP 132/82 | HR 62 | Temp 98.2°F | Ht 63.5 in | Wt 176.2 lb

## 2021-09-06 DIAGNOSIS — Z853 Personal history of malignant neoplasm of breast: Secondary | ICD-10-CM | POA: Diagnosis not present

## 2021-09-06 DIAGNOSIS — E785 Hyperlipidemia, unspecified: Secondary | ICD-10-CM

## 2021-09-06 DIAGNOSIS — E039 Hypothyroidism, unspecified: Secondary | ICD-10-CM

## 2021-09-06 DIAGNOSIS — I10 Essential (primary) hypertension: Secondary | ICD-10-CM | POA: Diagnosis not present

## 2021-09-06 DIAGNOSIS — E1169 Type 2 diabetes mellitus with other specified complication: Secondary | ICD-10-CM | POA: Diagnosis not present

## 2021-09-06 LAB — TSH: TSH: 3.31 mIU/L (ref 0.40–4.50)

## 2021-09-06 LAB — BASIC METABOLIC PANEL
BUN: 13 mg/dL (ref 7–25)
CO2: 29 mmol/L (ref 20–32)
Calcium: 10.2 mg/dL (ref 8.6–10.4)
Chloride: 102 mmol/L (ref 98–110)
Creat: 0.85 mg/dL (ref 0.60–1.00)
Glucose, Bld: 96 mg/dL (ref 65–99)
Potassium: 4.9 mmol/L (ref 3.5–5.3)
Sodium: 140 mmol/L (ref 135–146)

## 2021-09-06 LAB — CBC WITH DIFFERENTIAL/PLATELET
Absolute Monocytes: 697 cells/uL (ref 200–950)
Basophils Absolute: 52 cells/uL (ref 0–200)
Basophils Relative: 0.6 %
Eosinophils Absolute: 189 cells/uL (ref 15–500)
Eosinophils Relative: 2.2 %
HCT: 36.2 % (ref 35.0–45.0)
Hemoglobin: 11.6 g/dL — ABNORMAL LOW (ref 11.7–15.5)
Lymphs Abs: 1668 cells/uL (ref 850–3900)
MCH: 27.3 pg (ref 27.0–33.0)
MCHC: 32 g/dL (ref 32.0–36.0)
MCV: 85.2 fL (ref 80.0–100.0)
MPV: 12.5 fL (ref 7.5–12.5)
Monocytes Relative: 8.1 %
Neutro Abs: 5994 cells/uL (ref 1500–7800)
Neutrophils Relative %: 69.7 %
Platelets: 252 10*3/uL (ref 140–400)
RBC: 4.25 10*6/uL (ref 3.80–5.10)
RDW: 14.8 % (ref 11.0–15.0)
Total Lymphocyte: 19.4 %
WBC: 8.6 10*3/uL (ref 3.8–10.8)

## 2021-09-06 LAB — LIPID PANEL
Cholesterol: 181 mg/dL (ref <200)
HDL: 101 mg/dL (ref >=50)
LDL Cholesterol (Calc): 63 mg/dL (calc)
Non-HDL Cholesterol (Calc): 80 mg/dL (calc) (ref <130)
Total CHOL/HDL Ratio: 1.8 (calc) (ref <5.0)
Triglycerides: 83 mg/dL (ref <150)

## 2021-09-06 LAB — IRON, TOTAL/TOTAL IRON BINDING CAP
%SAT: 18 % (calc) (ref 16–45)
Iron: 63 ug/dL (ref 45–160)
TIBC: 353 mcg/dL (calc) (ref 250–450)

## 2021-09-06 LAB — HEMOGLOBIN A1C
Hgb A1c MFr Bld: 6 % of total Hgb — ABNORMAL HIGH (ref <5.7)
Mean Plasma Glucose: 126 mg/dL
eAG (mmol/L): 7 mmol/L

## 2021-09-06 NOTE — Progress Notes (Signed)
? ?  Subjective:  ? ? Patient ID: Victoria Hess, female    DOB: 1945/05/16, 76 y.o.   MRN: [id number] ? ?HPI 76 year old Female seen for 65-monthfollow-up.  She has a history of hypertension, hyperlipidemia, hypothyroidism, type 2 diabetes mellitus and history of breast cancer.  Had COVID-19 in May 2022.  Followed by cardiology for shortness of breath and chest discomfort.  Carotid Dopplers obtained in June 2022 showed no significant carotid artery disease but left subclavian artery stenosis was noted.  Had Myoview study June 2022 with ejection fraction of 63%. ? ?Was seen in October here for Medicare wellness visit. ? ?Dr.Croitoru saw her November 2022 for evaluation.  Patient was complaining of some dyspnea on exertion and precordial pain.  He did not feel that she had angina and she had a normal nuclear perfusion study in June 2022.  She also had an echocardiogram in June 2022 to evaluate murmur but no valvular stenosis was detected.  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  What is thought to be a carotid bruit is actually a subclavian bruit.  No further work-up was advised. ? ? ? ? ? ?Review of Systems no new complaints ? ?   ?Objective:  ? Physical Exam ? ? Left neck bruit. Hx of left subclavian stenosis. Carotids normal per cardiology.  Blood pressure is good at 132/82 pulse 62 temperature 98.2 degrees pulse oximetry 98% weight 176 pounds 4 ounces BMI 30.73.  In October 2022 BMI was 30.82. ?Skin warm and dry.  No cervical adenopathy.  Chest clear to auscultation.  Cardiac exam: Regular rate and rhythm without ectopy.  No lower extremity edema. ? ?   ?Assessment & Plan:  ?Type 2 diabetes mellitus-hemoglobin A1c 6% and previously was 6.1% in April 2022.  Treated with metformin. ? ?History of iron deficiency-iron studies are now normal.  Total iron is 63 ? ?Hypothyroidism-TSH within normal limits at 3.31 on levothyroxine 50 mcg daily ? ?Hypertension treated  with HCTZ and amlodipine 10 mg daily as well as Bystolic 20 mg daily ? ?Takes potassium supplement. ? ?Hyperlipidemia treated with Crestor 10 mg daily.  Lipid panel is normal. ? 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 ?Plan: She will continue to work on diet exercise and weight loss.  She will continue with current medications.  She will be due for health maintenance exam in 6 months.  Would like to see BMI less than 30.  Had tetanus updated at pharmacy. ? ?

## 2021-09-06 NOTE — Patient Instructions (Addendum)
Have tetanus update at at pharmacy.  Tried to work on diet exercise and weight loss and get BMI less than 30.  Continue current medications and return in 6 months for Medicare wellness and health maintenance exam.  It was a pleasure to see you today. ?

## 2021-09-28 ENCOUNTER — Encounter: Payer: Self-pay | Admitting: Cardiovascular Disease

## 2021-09-28 ENCOUNTER — Ambulatory Visit: Payer: Medicare Other | Admitting: Cardiovascular Disease

## 2021-09-28 VITALS — BP 130/80 | HR 59 | Ht 63.5 in | Wt 174.6 lb

## 2021-09-28 DIAGNOSIS — R002 Palpitations: Secondary | ICD-10-CM | POA: Diagnosis not present

## 2021-09-28 DIAGNOSIS — I739 Peripheral vascular disease, unspecified: Secondary | ICD-10-CM | POA: Diagnosis not present

## 2021-09-28 DIAGNOSIS — E7800 Pure hypercholesterolemia, unspecified: Secondary | ICD-10-CM

## 2021-09-28 DIAGNOSIS — I10 Essential (primary) hypertension: Secondary | ICD-10-CM

## 2021-09-28 DIAGNOSIS — R7303 Prediabetes: Secondary | ICD-10-CM

## 2021-09-28 NOTE — Progress Notes (Signed)
?Cardiology Office Note:   ? ?Date:  09/28/2021  ? ?ID:  Victoria Hess, DOB 04/28/1946, MRN [id number] ? ?PCP:  Elby Showers, MD ?  ?[facility] Providers ?Cardiologist:  Sanda Klein, MD    ? ?Referring MD: Elby Showers, MD  ? ?No chief complaint on file. ? ? ? ? ?History of Present Illness:   ? ?Victoria Hess is a 76 y.o. 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 ? ?Other than occasional random palpitations she feels well.  ? ?The patient specifically denies any chest pain at rest exertion, dyspnea at rest or with exertion, orthopnea, paroxysmal nocturnal dyspnea, syncope, focal neurological deficits, intermittent claudication, lower extremity edema, unexplained weight gain, cough, hemoptysis or wheezing. ? ?Normal echo and low risk nuclear stress test in June 2022. ? ?Always checks BP in right arm. At home BP is 130-140-70-84. At recent PCP visit 132/82. ? ?She has a strong family history of cardiovascular illness, but not of premature onset.  Her mother and father both had strokes and died in their early 36s.  Her brother received a stent when he was 46 years old.  She has never smoked. ? ?Most recent labs from April 2023 showed a hemoglobin A1c of  6.0% and an LDL of 63, with an exceptionally high HDL of 101.  She has never had a cardiac catheterization. ? ?Past Medical History:  ?Diagnosis Date  ? Breast cancer ([place])   ? Cancer [facility]) 2002,2011  ? Stage 2 breast cancer bil  ? Diabetes mellitus without complication ([place])   ? Family history of breast cancer   ? Family history of stomach cancer   ? Hyperlipidemia   ? Hypertension   ? Personal history of radiation therapy 2002,2011  ? Thyroid disease   ? hypothyroidism  ? Wears dentures   ? partial  ? Wears glasses   ? ? ?Past Surgical History:   ?Procedure Laterality Date  ? APPENDECTOMY    ? BREAST LUMPECTOMY Right 2002  ? BREAST LUMPECTOMY Left 03/07/2010  ? BREAST SURGERY    ? left lumpectomy sentinel  node biopsy  ? BREAST SURGERY    ? right lumpectomy  ? COLONOSCOPY  2008  ? at [place], "normal" exam  ? TUBAL LIGATION    ? ? ?Current Medications: ?Current Meds  ?Medication Sig  ? amLODipine (NORVASC) 10 MG tablet TAKE 1 TABLET BY MOUTH  DAILY  ? aspirin 81 MG tablet Take 81 mg by mouth daily.  ? Blood Glucose Monitoring Suppl (ONE TOUCH ULTRA SYSTEM KIT) W/DEVICE KIT 1 kit by Does not apply route once. Test bid.  DX E11.9  ? Calcium Carbonate (CALCIUM 600 PO) Take by mouth daily.  ? Cholecalciferol (VITAMIN D PO) Take 2,000 mg by mouth daily.  ? furosemide (LASIX) 40 MG tablet Take 1 tablet (40 mg total) by mouth daily as needed for edema.  ? glucose blood (BAYER CONTOUR NEXT TEST) test strip Use as instructed  ? hydrochlorothiazide (MICROZIDE) 12.5 MG capsule TAKE 1 CAPSULE BY MOUTH ONCE DAILY  ? IRON PO Take by mouth daily.  ? Lancets (ONETOUCH ULTRASOFT) lancets Test BID.  DX E11.9  ? levothyroxine (SYNTHROID) 50 MCG tablet TAKE 1 TABLET BY MOUTH  DAILY  ? metFORMIN (GLUCOPHAGE) 500 MG tablet TAKE 1 TABLET BY MOUTH  TWICE DAILY WITH MEALS  ?  Multiple Vitamin (MULTIVITAMIN PO) Take by mouth daily.  ? Nebivolol HCl 20 MG TABS TAKE 1 TABLET BY MOUTH  DAILY  ? potassium chloride SA (KLOR-CON) 20 MEQ tablet TAKE 1 TABLET BY MOUTH  TWICE DAILY  ? rosuvastatin (CRESTOR) 10 MG tablet Take 1 tablet (10 mg total) by mouth daily.  ?  ? ?Allergies:   Irbesartan  ? ?Social History  ? ?Socioeconomic History  ? Marital status: Married  ?  Spouse name: Not on file  ? Number of children: Not on file  ? Years of education: Not on file  ? Highest education level: Not on file  ?Occupational History  ? Not on file  ?Tobacco Use  ? Smoking status: Never  ? Smokeless tobacco: Never  ?Vaping Use  ? Vaping Use: Never used  ?Substance and Sexual Activity  ? Alcohol use: No  ?  Drug use: No  ? Sexual activity: Not Currently  ?  Birth control/protection: Post-menopausal  ?Other Topics Concern  ? Not on file  ?Social History Narrative  ? Not on file  ? ?Social Determinants of Health  ? ?Financial Resource Strain: Not on file  ?Food Insecurity: Not on file  ?Transportation Needs: Not on file  ?Physical Activity: Not on file  ?Stress: Not on file  ?Social Connections: Not on file  ?  ? ?Family History: ?The patient's family history includes Breast cancer in her cousin; Breast cancer (age of onset: 32) in her sister; Breast cancer (age of onset: 58) in her maternal aunt; Cancer in her brother; Diabetes in her father, mother, and sister; Heart disease in her mother; Hepatitis C in her brother; Hyperlipidemia in her sister; Iron deficiency in her father, mother, sister, and sister; Stomach cancer (age of onset: 57) in her father. There is no history of Colon cancer. ? ?ROS:   ?Please see the history of present illness.    ? All other systems reviewed and are negative. ? ?EKGs/Labs/Other Studies Reviewed:   ? ?The following studies were reviewed today: ? ?EKG:  EKG is ordered today.  Sinus bradycardia. As before, she has anterior T wave inversion in V1-V5.  ? ?Recent Labs: ?03/04/2021: ALT 8 ?09/05/2021: BUN 13; Creat 0.85; Hemoglobin 11.6; Platelets 252; Potassium 4.9; Sodium 140; TSH 3.31  ?Recent Lipid Panel ?   ?Component Value Date/Time  ? CHOL 181 09/05/2021 1112  ? TRIG 83 09/05/2021 1112  ? HDL 101 09/05/2021 1112  ? CHOLHDL 1.8 09/05/2021 1112  ? VLDL 9 11/10/2016 1103  ? [place] 63 09/05/2021 1112  ? ? ? ?Risk Assessment/Calculations:   ?  ? ? ?Physical Exam:   ? ?VS:  BP 130/80   Pulse (!) 59   Ht 5' 3.5" (1.613 m)   Wt 174 lb 9.6 oz (79.2 kg)   SpO2 96%   BMI 30.44 kg/m?    ? ?Wt Readings from Last 3 Encounters:  ?09/28/21 174 lb 9.6 oz (79.2 kg)  ?09/06/21 176 lb 4 oz (79.9 kg)  ?03/25/21 172 lb 6.4 oz (78.2 kg)  ?  ? ?General: Alert, oriented x3, no distress, borderline  obese ?Head: no evidence of trauma, PERRL, EOMI, no exophtalmos or lid lag, no myxedema, no xanthelasma; normal ears, nose and oropharynx ?Neck: normal jugular venous pulsations and no hepatojugular reflux; brisk carotid pulses without delay. There are distinct left carotid and left subclavian bruits. ?Chest: clear to auscultation, no signs of consolidation by percussion or palpation, normal fremitus, symmetrical and full respiratory excursions ?Cardiovascular: normal position and  quality of the apical impulse, regular rhythm, normal first and second heart sounds, 2/6 early peaking aortic sclerosis murmur, no diastolic murmurs, rubs or gallops ?Abdomen: no tenderness or distention, no masses by palpation, no abnormal pulsatility or arterial bruits, normal bowel sounds, no hepatosplenomegaly ?Extremities: no clubbing, cyanosis or edema; 2+ radial, ulnar and brachial pulses bilaterally; 2+ right femoral, posterior tibial and dorsalis pedis pulses; 2+ left femoral, posterior tibial and dorsalis pedis pulses; no subclavian or femoral bruits ?Neurological: grossly nonfocal ?Psych: Normal mood and affect ? ? ? ?ASSESSMENT:   ? ?1. Palpitations   ?2. Essential hypertension   ?3. Prediabetes   ?4. Hypercholesterolemia   ?5. PAD (peripheral artery disease) ([place])   ? ? ? ?PLAN:   ? ?In order of problems listed above: ? ?Palpitations: infrequent and not particularly troublesome. No associated complaints. Without meaningful structural cardiac abnormalities, unlikely to be serious. Offered prn beta blocker, but she does not feel this is necessary ?HTN: Well controlled ?PreDM: Well-controlled without meds ?HLP: LDL at target <70. ?PAD/Left subclavian stenosis: asymptomatic. No claudication or vertebral steal syndrome symptoms. ? ? ?   ? ? ?Medication Adjustments/Labs and Tests Ordered: ?Current medicines are reviewed at length with the patient today.  Concerns regarding medicines are outlined above.  ?Orders Placed This Encounter   ?Procedures  ? EKG 12-Lead  ? ? ?No orders of the defined types were placed in this encounter. ? ? ? ?Patient Instructions  ?Medication Instructions:  ?No changes ?*If you need a refill on your cardiac medications befo

## 2021-09-28 NOTE — Patient Instructions (Signed)
Medication Instructions:  No changes *If you need a refill on your cardiac medications before your next appointment, please call your pharmacy*   Lab Work: None ordered If you have labs (blood work) drawn today and your tests are completely normal, you will receive your results only by: MyChart Message (if you have MyChart) OR A paper copy in the mail If you have any lab test that is abnormal or we need to change your treatment, we will call you to review the results.   Testing/Procedures: None ordered   Follow-Up: At [facility], you and your health needs are our priority.  As part of our continuing mission to provide you with exceptional heart care, we have created designated Provider Care Teams.  These Care Teams include your primary Cardiologist (physician) and Advanced Practice Providers (APPs -  Physician Assistants and Nurse Practitioners) who all work together to provide you with the care you need, when you need it.  We recommend signing up for the patient portal called "MyChart".  Sign up information is provided on this After Visit Summary.  MyChart is used to connect with patients for Virtual Visits (Telemedicine).  Patients are able to view lab/test results, encounter notes, upcoming appointments, etc.  Non-urgent messages can be sent to your provider as well.   To learn more about what you can do with MyChart, go to [email or web address].    Your next appointment:   12 month(s)  The format for your next appointment:   In Person  Provider:   Mihai Croitoru, MD {  I Important Information About Sugar       

## 2021-10-30 ENCOUNTER — Other Ambulatory Visit: Payer: Self-pay | Admitting: Internal Medicine

## 2021-11-08 ENCOUNTER — Other Ambulatory Visit: Payer: Self-pay | Admitting: Internal Medicine

## 2021-12-26 ENCOUNTER — Other Ambulatory Visit: Payer: Self-pay | Admitting: Internal Medicine

## 2021-12-28 ENCOUNTER — Other Ambulatory Visit: Payer: Self-pay | Admitting: Cardiovascular Disease

## 2022-01-06 ENCOUNTER — Other Ambulatory Visit: Payer: Self-pay | Admitting: Cardiovascular Disease

## 2022-01-24 ENCOUNTER — Other Ambulatory Visit: Payer: Self-pay | Admitting: Internal Medicine

## 2022-01-24 DIAGNOSIS — Z1231 Encounter for screening mammogram for malignant neoplasm of breast: Secondary | ICD-10-CM

## 2022-03-06 ENCOUNTER — Ambulatory Visit
Admission: RE | Admit: 2022-03-06 | Discharge: 2022-03-06 | Disposition: A | Discharge status: Home / Self Care | Payer: Medicare Other | Source: Ambulatory Visit | Attending: Internal Medicine | Admitting: Internal Medicine

## 2022-03-06 DIAGNOSIS — Z1231 Encounter for screening mammogram for malignant neoplasm of breast: Secondary | ICD-10-CM

## 2022-03-24 ENCOUNTER — Other Ambulatory Visit: Payer: Medicare Other

## 2022-03-24 DIAGNOSIS — I10 Essential (primary) hypertension: Secondary | ICD-10-CM

## 2022-03-24 DIAGNOSIS — E039 Hypothyroidism, unspecified: Secondary | ICD-10-CM

## 2022-03-24 DIAGNOSIS — E1169 Type 2 diabetes mellitus with other specified complication: Secondary | ICD-10-CM

## 2022-03-24 DIAGNOSIS — D649 Anemia, unspecified: Secondary | ICD-10-CM

## 2022-03-24 DIAGNOSIS — E785 Hyperlipidemia, unspecified: Secondary | ICD-10-CM | POA: Diagnosis not present

## 2022-03-25 LAB — CBC WITH DIFFERENTIAL/PLATELET
Absolute Monocytes: 707 cells/uL (ref 200–950)
Basophils Absolute: 48 cells/uL (ref 0–200)
Basophils Relative: 0.7 %
Eosinophils Absolute: 462 cells/uL (ref 15–500)
Eosinophils Relative: 6.8 %
HCT: 33.4 % — ABNORMAL LOW (ref 35.0–45.0)
Hemoglobin: 10.9 g/dL — ABNORMAL LOW (ref 11.7–15.5)
Lymphs Abs: 1591 cells/uL (ref 850–3900)
MCH: 27.7 pg (ref 27.0–33.0)
MCHC: 32.6 g/dL (ref 32.0–36.0)
MCV: 85 fL (ref 80.0–100.0)
MPV: 12.5 fL (ref 7.5–12.5)
Monocytes Relative: 10.4 %
Neutro Abs: 3992 cells/uL (ref 1500–7800)
Neutrophils Relative %: 58.7 %
Platelets: 262 10*3/uL (ref 140–400)
RBC: 3.93 10*6/uL (ref 3.80–5.10)
RDW: 14.2 % (ref 11.0–15.0)
Total Lymphocyte: 23.4 %
WBC: 6.8 10*3/uL (ref 3.8–10.8)

## 2022-03-25 LAB — HEMOGLOBIN A1C
Hgb A1c MFr Bld: 6.1 % of total Hgb — ABNORMAL HIGH (ref <5.7)
Mean Plasma Glucose: 128 mg/dL
eAG (mmol/L): 7.1 mmol/L

## 2022-03-25 LAB — MICROALBUMIN / CREATININE URINE RATIO
Creatinine, Urine: 83 mg/dL (ref 20–275)
Microalb Creat Ratio: 5 mcg/mg creat (ref <30)
Microalb, Ur: 0.4 mg/dL

## 2022-03-25 LAB — COMPLETE METABOLIC PANEL WITH GFR
AG Ratio: 1.6 (calc) (ref 1.0–2.5)
ALT: 22 U/L (ref 6–29)
AST: 23 U/L (ref 10–35)
Albumin: 4.2 g/dL (ref 3.6–5.1)
Alkaline phosphatase (APISO): 72 U/L (ref 37–153)
BUN: 15 mg/dL (ref 7–25)
CO2: 32 mmol/L (ref 20–32)
Calcium: 9.9 mg/dL (ref 8.6–10.4)
Chloride: 104 mmol/L (ref 98–110)
Creat: 0.78 mg/dL (ref 0.60–1.00)
Globulin: 2.7 g/dL (calc) (ref 1.9–3.7)
Glucose, Bld: 88 mg/dL (ref 65–99)
Potassium: 5.1 mmol/L (ref 3.5–5.3)
Sodium: 140 mmol/L (ref 135–146)
Total Bilirubin: 0.3 mg/dL (ref 0.2–1.2)
Total Protein: 6.9 g/dL (ref 6.1–8.1)
eGFR: 79 mL/min/{1.73_m2} (ref >=60)

## 2022-03-25 LAB — LIPID PANEL
Cholesterol: 168 mg/dL (ref <200)
HDL: 92 mg/dL (ref >=50)
LDL Cholesterol (Calc): 63 mg/dL (calc)
Non-HDL Cholesterol (Calc): 76 mg/dL (calc) (ref <130)
Total CHOL/HDL Ratio: 1.8 (calc) (ref <5.0)
Triglycerides: 51 mg/dL (ref <150)

## 2022-03-25 LAB — TSH: TSH: 3.72 mIU/L (ref 0.40–4.50)

## 2022-03-27 ENCOUNTER — Other Ambulatory Visit: Payer: Self-pay

## 2022-03-27 DIAGNOSIS — D649 Anemia, unspecified: Secondary | ICD-10-CM

## 2022-03-27 NOTE — Addendum Note (Signed)
Addended by: Geradine Girt D on: 03/27/2022 10:51 AM   Modules accepted: Orders

## 2022-03-28 ENCOUNTER — Ambulatory Visit (INDEPENDENT_AMBULATORY_CARE_PROVIDER_SITE_OTHER): Payer: Medicare Other | Admitting: Internal Medicine

## 2022-03-28 ENCOUNTER — Encounter: Payer: Self-pay | Admitting: Internal Medicine

## 2022-03-28 VITALS — BP 136/72 | HR 60 | Temp 98.1°F | Ht 63.0 in | Wt 178.1 lb

## 2022-03-28 DIAGNOSIS — E1169 Type 2 diabetes mellitus with other specified complication: Secondary | ICD-10-CM

## 2022-03-28 DIAGNOSIS — I10 Essential (primary) hypertension: Secondary | ICD-10-CM

## 2022-03-28 DIAGNOSIS — E8881 Metabolic syndrome: Secondary | ICD-10-CM

## 2022-03-28 DIAGNOSIS — E785 Hyperlipidemia, unspecified: Secondary | ICD-10-CM

## 2022-03-28 DIAGNOSIS — E039 Hypothyroidism, unspecified: Secondary | ICD-10-CM

## 2022-03-28 DIAGNOSIS — D649 Anemia, unspecified: Secondary | ICD-10-CM

## 2022-03-28 DIAGNOSIS — Z0000 Encounter for general adult medical examination without abnormal findings: Secondary | ICD-10-CM

## 2022-03-28 DIAGNOSIS — Z853 Personal history of malignant neoplasm of breast: Secondary | ICD-10-CM

## 2022-03-28 DIAGNOSIS — Z6831 Body mass index (BMI) 31.0-31.9, adult: Secondary | ICD-10-CM

## 2022-03-28 LAB — POCT URINALYSIS DIPSTICK
Bilirubin, UA: NEGATIVE
Blood, UA: NEGATIVE
Glucose, UA: NEGATIVE
Ketones, UA: NEGATIVE
Leukocytes, UA: NEGATIVE
Nitrite, UA: NEGATIVE
Protein, UA: NEGATIVE
Spec Grav, UA: 1.015 (ref 1.010–1.025)
Urobilinogen, UA: 0.2 E.U./dL
pH, UA: 5 (ref 5.0–8.0)

## 2022-03-28 NOTE — Progress Notes (Signed)
Annual Wellness Visit     Patient: Victoria Hess, Female    DOB: March 23, 1946, 76 y.o.   MRN: [id number] Visit Date: 03/28/2022   Subjective    Victoria Hess is a 76 y.o. female who presents today for her Annual Wellness Visit.  HPI She also presents for health maintenance exam and evaluation of medical issues.  She has a history of hypertension, hyperlipidemia, hypothyroidism, type 2 diabetes mellitus and history of breast cancer.  She had COVID-19 in May 2022.  She is followed by cardiologist for shortness of breath and chest discomfort.  Carotid Dopplers obtained in June 2022 showed no significant carotid artery disease but left subclavian artery stenosis was noted.  She had a Myoview study done in June 2022 with ejection fraction of 63%.  [name] who saw her in November 2022 for evaluation patient was complaining of some dyspnea on exertion and precordial pain.  He did not feel that she had angina and she had a normal nuclear perfusion study in June 2022.  She also had an echocardiogram in June 2022 to evaluate murmur but no valvular stenosis was detected.  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  What was thought to be a carotid bruit was actually a subclavian bruit.  No further workup was advised.  Past medical history: History of bilateral ductal carcinoma in situ.  She had wide excision procedure of the left breast in 2011 and a lesser procedure done on the right breast in 2002.  Has done well since that time.  Tonsillectomy and adenoidectomy in 1955.  Appendectomy and tubal ligation 1977.  History of uterine fibroids, cystocele and uterine prolapse.  Social history: She lives in [place].  Does not smoke or consume alcohol.  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  Family history: Father died at age 79 of a stroke.  Mother died from coronary  disease.  1 brother with history of hypertension and diabetes.  1 sister with hypertension and diabetes.  Another sister with hypertension.  1 sister in good health without medical issues.        Review of Systems no new complaints   Objective    Vitals: BP (!) 140/84   Pulse 60   Temp 98.1 F (36.7 C) (Tympanic)   Ht '5\' 3"'$  (1.6 m)   Wt 178 lb 1.9 oz (80.8 kg)   SpO2 99%   BMI 31.55 kg/m   Physical Exam  Skin: Warm and dry.  No cervical adenopathy.  Chest clear.  TMs clear.  No thyromegaly.  Cardiac exam regular rate and rhythm without ectopy.  Abdomen soft nondistended without hepatosplenomegaly, masses, or tenderness.  No lower extremity pitting edema.  Neurological exam is intact without gross focal deficits.   Most recent functional status assessment:    03/28/2022   11:08 AM  In your present state of health, do you have any difficulty performing the following activities:  Hearing? 0  Vision? 0  Difficulty concentrating or making decisions? 0  Walking or climbing stairs? 0  Dressing or bathing? 0  Doing errands, shopping? 0  Preparing Food and eating ? N  Using the Toilet? N  In the past six months, have you accidently leaked urine? N  Do you have problems with loss of bowel control? N  Managing your Medications? N  Managing your Finances? N  Housekeeping or  managing your Housekeeping? N   Most recent fall risk assessment:    03/28/2022   11:07 AM  Fall Risk   Falls in the past year? 0  Number falls in past yr: 0  Injury with Fall? 0  Risk for fall due to : No Fall Risks  Follow up Falls evaluation completed    Most recent depression screenings:    03/28/2022   11:08 AM 03/08/2021    2:13 PM  PHQ 2/9 Scores  PHQ - 2 Score 0 0   Most recent cognitive screening:    03/28/2022   11:09 AM  6CIT Screen  What Year? 0 points  What month? 0 points  What time? 0 points  Count back from 20 0 points  Months in reverse 0 points  Repeat phrase 0  points  Total Score 0 points       Assessment & Plan   Type 2 diabetes mellitus-hemoglobin A1c 6.1% and stable  Normocytic anemia -Hemoglobin is 10.9 g.  Had colonoscopy in 2018 was within normal limits.  Recommend multivitamin with iron and follow-up at next visit.  Hypothyroidism-continue thyroid replacement medication  Hypertension-continue Bystolic, amlodipine and HCTZ  Hyperlipidemia treated with Crestor and lipid panel is normal  History of bilateral ductal carcinoma in situ: In 2002 affecting right breast and 2011 affecting left breast.  BMI 31.55-continue to work on diet exercise and weight loss  Plan: Follow-up in May with labs in 84-monthrecheck appointment.  Immunizations discussed.  Needs tetanus update.  Had high-dose flu vaccine last year but not this year.  She should get new flu vaccine.  RSV and pneumococcal 20 vaccines discussed.         Annual wellness visit done today including the all of the following: Reviewed patient's Family Medical History Reviewed and updated list of patient's medical providers Assessment of cognitive impairment was done Assessed patient's functional ability Established a written schedule for health screening [id number] and Reviewed  Discussed health benefits of physical activity, and encouraged her to engage in regular exercise appropriate for her age and condition.         {I, MElby Showers MD, have reviewed all documentation for this visit. The documentation on 04/12/22 for the exam, diagnosis, procedures, and orders are all accurate and complete.   [name] CMA

## 2022-04-12 NOTE — Patient Instructions (Signed)
It was a pleasure to see you today.  Vaccines discussed.  Labs are stable.  Hemoglobin is slightly low at 10.9 g.  Recommend multivitamin with iron and follow-up at next visit.  Continue current medications.  Work on diet and exercise.

## 2022-04-19 IMAGING — MG MM DIGITAL SCREENING BILAT W/ TOMO AND CAD
6 of 10 series · 6 of 30 positions shown · non-contrast
Comparison: Previous exam(s).

CLINICAL DATA: Screening. History of bilateral breast cancers and
lumpectomies.

EXAM:
DIGITAL SCREENING BILATERAL MAMMOGRAM WITH TOMOSYNTHESIS AND CAD
TECHNIQUE: Bilateral screening digital craniocaudal and mediolateral oblique
mammograms were obtained. Bilateral screening digital breast
tomosynthesis was performed. The images were evaluated with
computer-aided detection.

[L MLO synth-2D (1 of 2)]
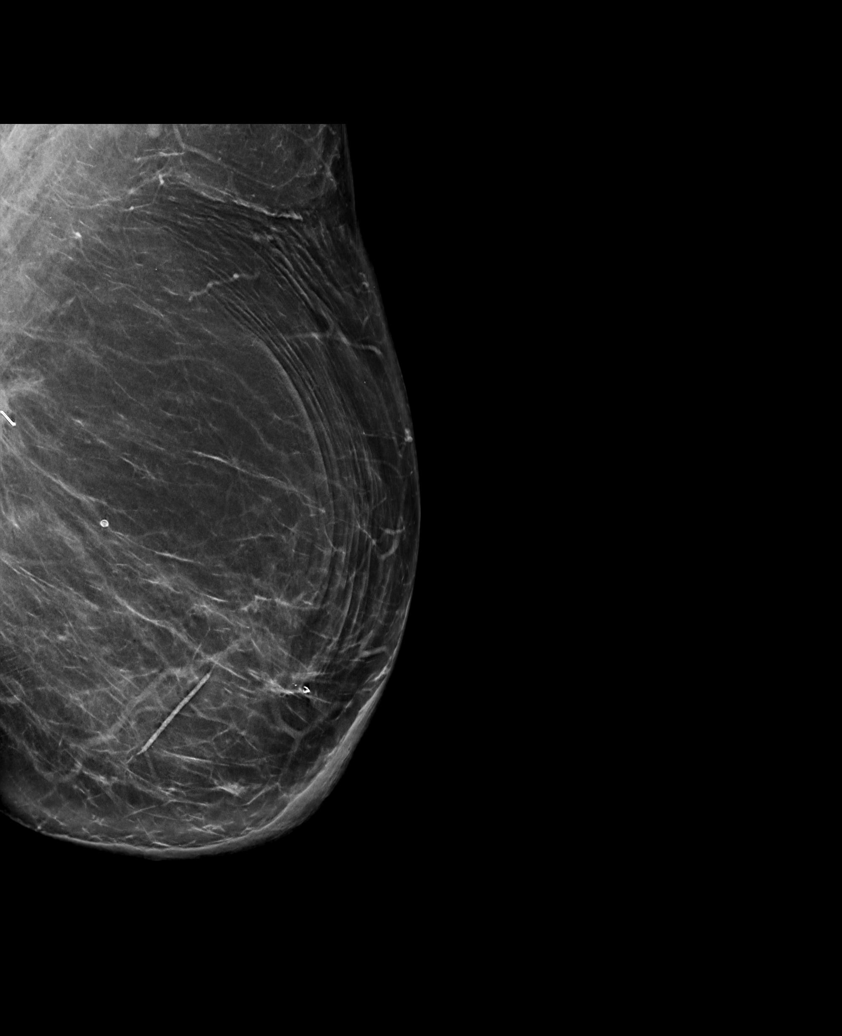

[L MLO synth-2D (2 of 2)]
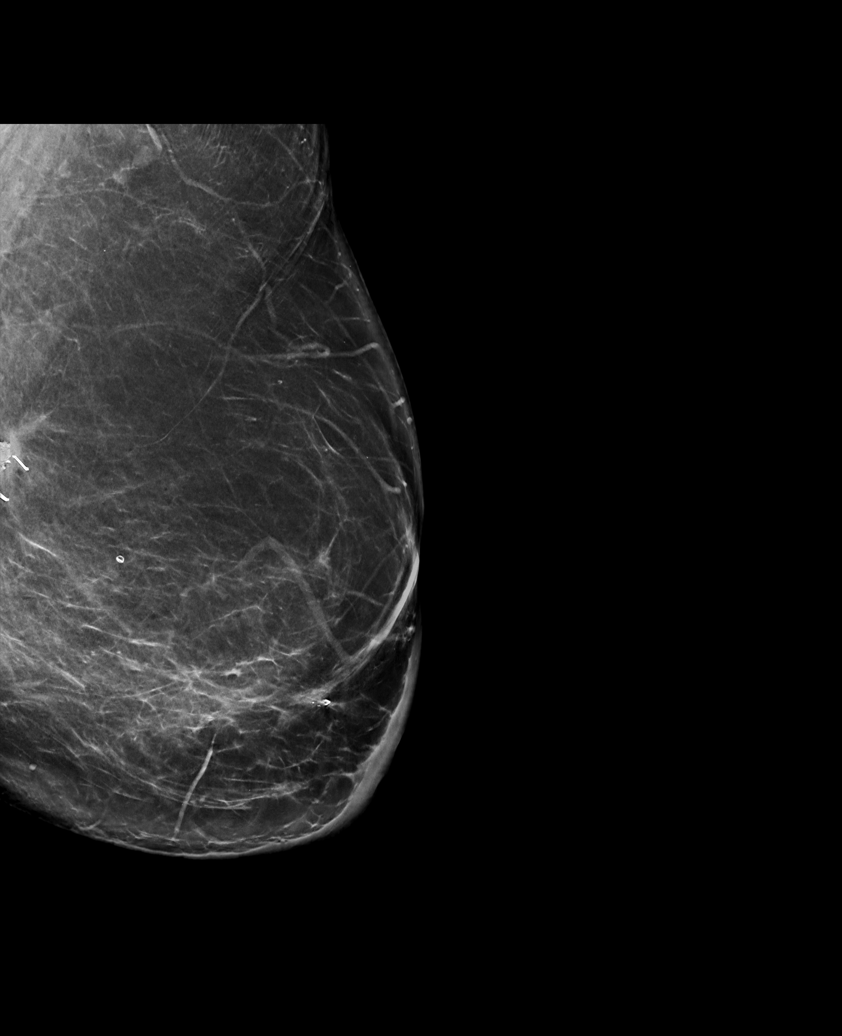

[R CC synth-2D]
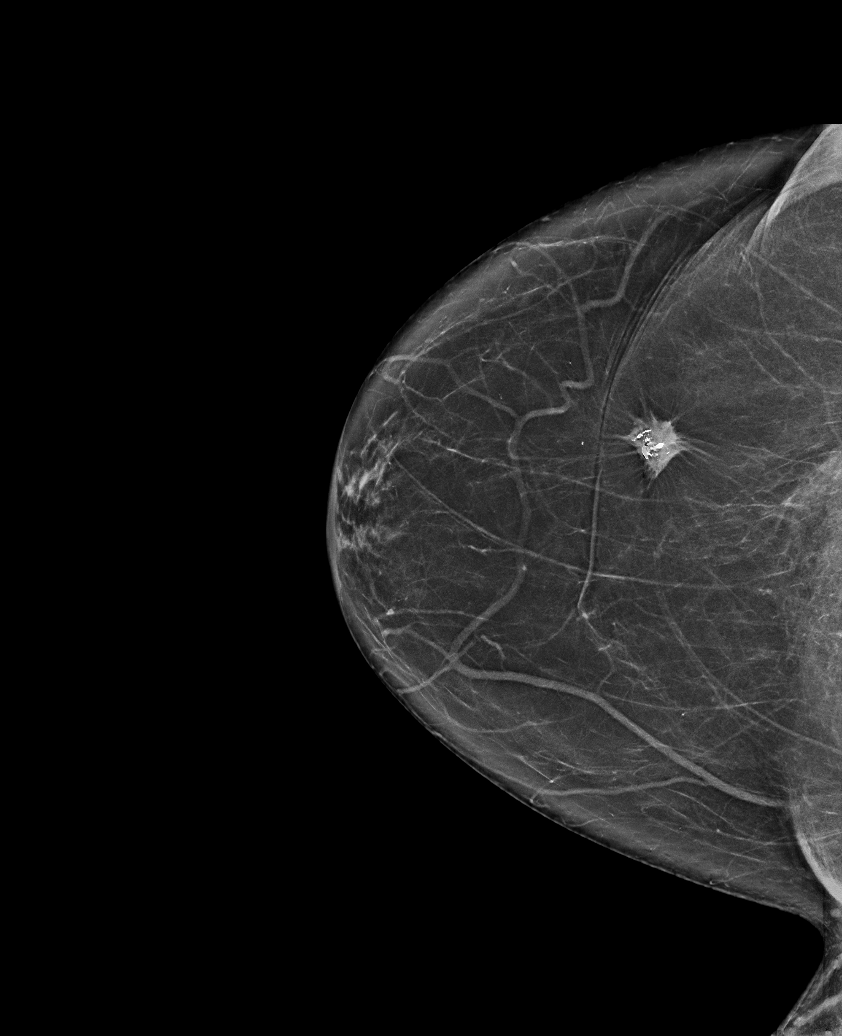

[R MLO synth-2D]
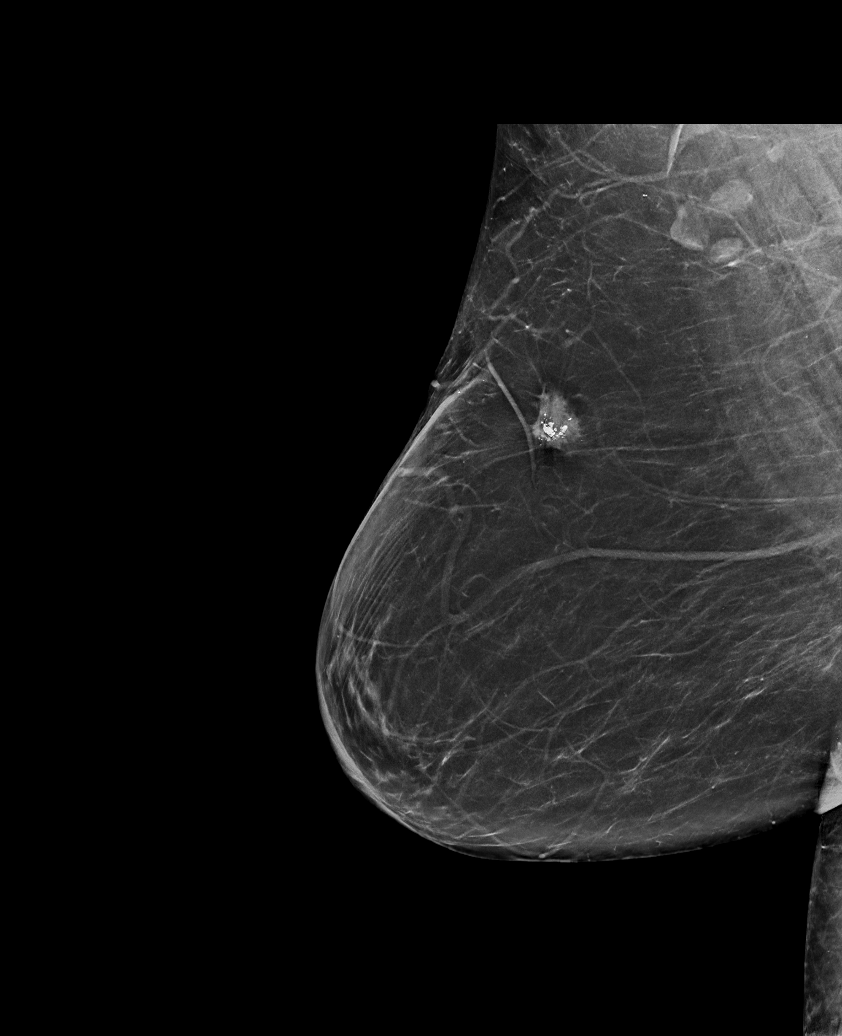

[L CC synth-2D]
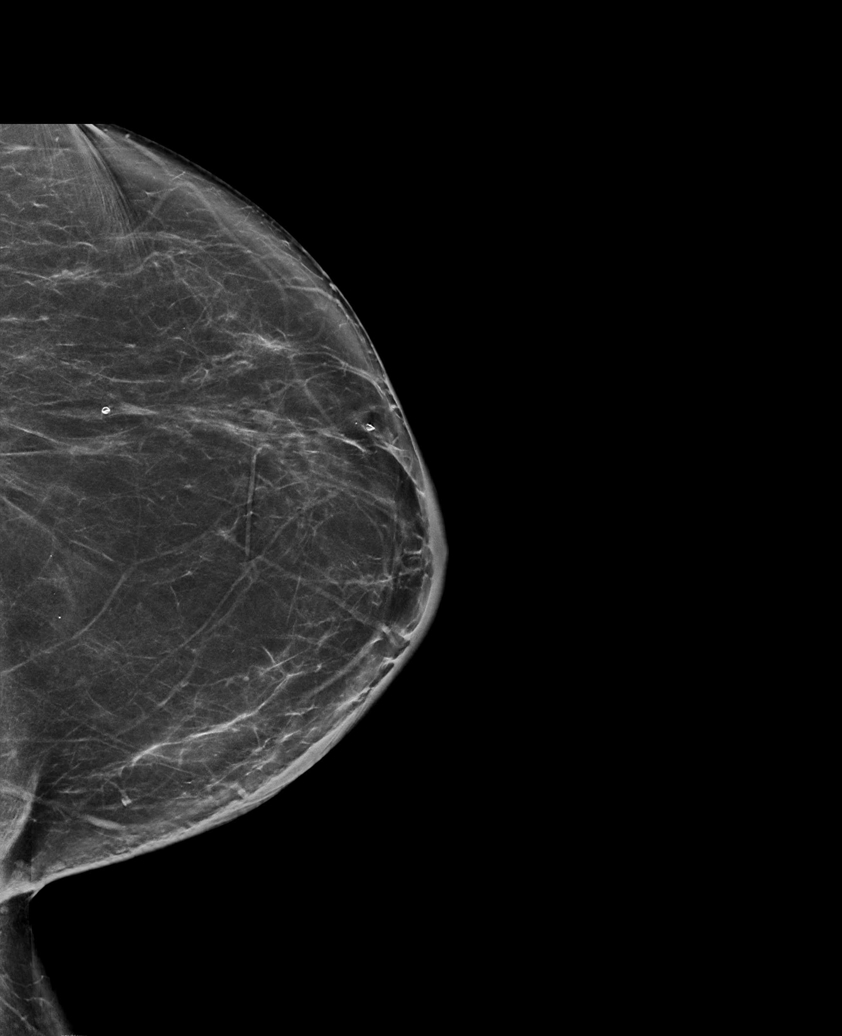

[L MLO tomo · tomo slice 46/91.0]
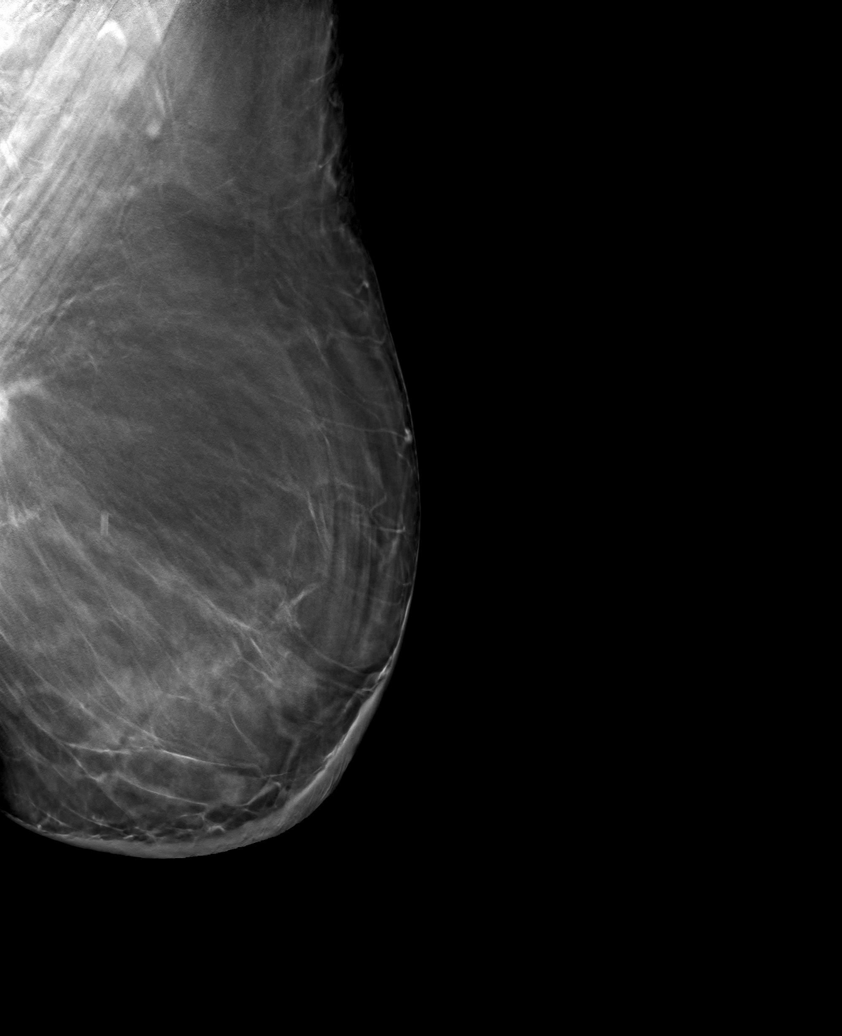

[6 of 30 positions shown; findings below may reference images not displayed]

ACR Breast Density Category b: There are scattered areas of
fibroglandular density.
FINDINGS: There are no findings suspicious for malignancy. Surgical changes
within both breasts are again noted.
IMPRESSION: No mammographic evidence of malignancy. A result letter of this
screening mammogram will be mailed directly to the patient.

RECOMMENDATION:
Screening mammogram in one year. (Code:IW-8-DYF)

BI-RADS CATEGORY  2: Benign.

## 2022-06-12 ENCOUNTER — Other Ambulatory Visit: Payer: Self-pay | Admitting: Internal Medicine

## 2022-06-19 ENCOUNTER — Other Ambulatory Visit: Payer: Self-pay | Admitting: Internal Medicine

## 2022-09-12 ENCOUNTER — Other Ambulatory Visit: Payer: Self-pay | Admitting: Internal Medicine

## 2022-09-13 NOTE — Progress Notes (Signed)
Patient Care Team: Margaree Mackintosh, MD as PCP - General (Internal Medicine) Thurmon Fair, MD as PCP - Cardiology (Cardiology)  Visit Date: 09/26/22  Subjective:    Patient ID: Victoria Hess , Female   DOB: 1946-02-14, 77 y.o.    MRN: [id number]   77 y.o. Female presents today for a 6 month follow-up. Patient has a past medical history of stage 2 bilateral breast cancer, Type 2 diabetes mellitus, hyperlipidemia, hypertension, hypothyroidism.  History of Type 2 diabetes mellitus treated with metformin 500 mg twice daily with meals. HGBA1c at 6.1% on 09/22/22, no change from 03/24/22.  History of hyperlipidemia treated with rosuvastatin 10 mg daily in the evening. Takes potassium chloride 20 meq twice daily. Lipid panel normal on 09/22/22.  History of hypertension treated with nebivolol 20 mg daily, hydrochlorothiazide 12.5 mg daily, amlodipine 10 mg daily. Blood pressure normal today at 118/78.  History of lower extremity edema treated with furosemide 40 mg daily as needed. Notes some swelling in ankles.  History of hypothyroidism treated with levothyroxine 50 mcg daily. TSH at 1.49.  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  She had wide excision procedure of the left breast in 2011 and a lesser procedure done on the right breast in 2002.  Has done well since that time.   Tonsillectomy and adenoidectomy in 1955.  Appendectomy and tubal ligation 1977.  History of uterine fibroids, cystocele and uterine prolapse.  She is followed by cardiologist for shortness of breath and chest discomfort. Carotid Dopplers obtained in June 2022 showed no significant carotid artery disease but left subclavian artery stenosis was noted. She had a Myoview study done in June 2022 with ejection fraction of 63%.   Dr. Royann Shivers who saw her in November 2022 for evaluation patient was complaining of some dyspnea on exertion and precordial pain. He did not feel  that she had angina and she had a normal nuclear perfusion study in June 2022. She also had an echocardiogram in June 2022 to evaluate murmur but no valvular stenosis was detected. 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 What was thought to be a carotid bruit was actually a subclavian bruit. No further workup was advised.   Liver function normal.   Discussed situational stress relating to husband's recent terminal pancreatic cancer diagnosis. Anxiety medication offered and she declines. Has support of family and church minister.   Past Medical History:  Diagnosis Date   Breast cancer (HCC)    Cancer (HCC) [phone]   Stage 2 breast cancer bil   Diabetes mellitus without complication (HCC)    Family history of breast cancer    Family history of stomach cancer    Hyperlipidemia    Hypertension    Personal history of radiation therapy 2002,2011   Thyroid disease    hypothyroidism   Wears dentures    partial   Wears glasses      Family History  Problem Relation Age of Onset   Diabetes Mother    Heart disease Mother    Iron deficiency Mother    Diabetes Father    Iron deficiency Father    Stomach cancer Father 47   Diabetes Sister    Hyperlipidemia Sister    Iron deficiency Sister    Kidney disease Sister    Breast cancer Sister 10       DCIS   Iron deficiency Sister  Hepatitis C Brother    Cancer Brother        hepatocellular   Lung cancer Brother    Breast cancer Maternal Aunt 27   Breast cancer Cousin    Colon cancer Neg Hx     Social History   Social History Narrative   Not on file      Review of Systems  Constitutional:  Negative for fever and malaise/fatigue.  HENT:  Negative for congestion.   Eyes:  Negative for blurred vision.  Respiratory:  Negative for cough and shortness of breath.   Cardiovascular:  Negative for chest pain, palpitations and leg swelling.  Gastrointestinal:  Negative for  vomiting.  Musculoskeletal:  Negative for back pain.  Skin:  Negative for rash.  Neurological:  Negative for loss of consciousness and headaches.  Psychiatric/Behavioral:  The patient is nervous/anxious.         Objective:   Vitals: BP 118/78   Pulse 74   Temp 98.1 F (36.7 C) (Temporal)   Resp 16   Ht 5\' 3"  (1.6 m)   Wt 176 lb 12 oz (80.2 kg)   SpO2 99%   BMI 31.31 kg/m    Physical Exam Vitals and nursing note reviewed.  Constitutional:      General: She is not in acute distress.    Appearance: Normal appearance. She is not toxic-appearing.  HENT:     Head: Normocephalic and atraumatic.  Neck:     Thyroid: No thyroid mass, thyromegaly or thyroid tenderness.     Vascular: No carotid bruit.  Cardiovascular:     Rate and Rhythm: Normal rate and regular rhythm. No extrasystoles are present.    Pulses: Normal pulses.     Heart sounds: Murmur heard.     Systolic murmur is present with a grade of 2/6.     No friction rub. No gallop.     Comments: Systolic ejection murmur that radiates into the left carotid. Pulmonary:     Effort: Pulmonary effort is normal. No respiratory distress.     Breath sounds: Normal breath sounds. No wheezing or rales.  Lymphadenopathy:     Cervical: No cervical adenopathy.  Skin:    General: Skin is warm and dry.  Neurological:     Mental Status: She is alert and oriented to person, place, and time. Mental status is at baseline.  Psychiatric:        Mood and Affect: Mood normal.        Behavior: Behavior normal.        Thought Content: Thought content normal.        Judgment: Judgment normal.       Results:   Studies obtained and personally reviewed by me:   Labs:       Component Value Date/Time   NA 140 03/24/2022 0931   NA 141 04/12/2015 1519   K 5.1 03/24/2022 0931   K 3.9 04/12/2015 1519   CL 104 03/24/2022 0931   CL 102 02/29/2012 1517   CO2 32 03/24/2022 0931   CO2 27 04/12/2015 1519   GLUCOSE 88 03/24/2022 0931    GLUCOSE 88 04/12/2015 1519   GLUCOSE 78 02/29/2012 1517   BUN 15 03/24/2022 0931   BUN 25.4 04/12/2015 1519   CREATININE 0.78 03/24/2022 0931   CREATININE 0.9 04/12/2015 1519   CALCIUM 9.9 03/24/2022 0931   CALCIUM 10.0 04/12/2015 1519   PROT 6.9 09/22/2022 0930   PROT 7.8 04/12/2015 1519   ALBUMIN 3.6 05/17/2018 1017  ALBUMIN 4.0 04/12/2015 1519   AST 22 09/22/2022 0930   AST 14 (L) 05/17/2018 1017   AST 18 04/12/2015 1519   ALT 16 09/22/2022 0930   ALT 11 05/17/2018 1017   ALT 11 04/12/2015 1519   ALKPHOS 111 05/17/2018 1017   ALKPHOS 89 04/12/2015 1519   BILITOT 0.3 09/22/2022 0930   BILITOT <0.2 (L) 05/17/2018 1017   BILITOT <0.30 04/12/2015 1519   GFRNONAA 64 02/24/2020 0915   GFRAA 75 02/24/2020 0915     Lab Results  Component Value Date   WBC 6.8 03/24/2022   HGB 10.9 (L) 03/24/2022   HCT 33.4 (L) 03/24/2022   MCV 85.0 03/24/2022   PLT 262 03/24/2022    Lab Results  Component Value Date   CHOL 152 09/22/2022   HDL 85 09/22/2022   LDLCALC 54 09/22/2022   TRIG 51 09/22/2022   CHOLHDL 1.8 09/22/2022    Lab Results  Component Value Date   HGBA1C 6.1 (H) 09/22/2022     Lab Results  Component Value Date   TSH 1.49 09/22/2022      Assessment & Plan:   Type 2 diabetes mellitus: treated with metformin 500 mg twice daily with meals. HGBA1c at 6.1% on 09/22/22, no change from 03/24/22.  Hyperlipidemia: treated with rosuvastatin 10 mg daily in the evening.  Lipid panel normal on 09/22/22.  Hypertension: treated with nebivolol 20 mg daily, hydrochlorothiazide 12.5 mg daily, amlodipine 10 mg daily. Blood pressure normal today at 118/78.Takes potassium supplement.  Lower extremity edema: treated with furosemide 40 mg daily as needed.  Hypothyroidism: treated with levothyroxine 50 mcg daily. TSH at 1.49.  Will return in November for health maintenance exam or as needed. She will call if she needs [place].  Situational stress: Husband has been dx with pancreatic  cancer and remains at home.  I,Alexander Ruley,acting as a Neurosurgeon for Margaree Mackintosh, MD.,have documented all relevant documentation on the behalf of Margaree Mackintosh, MD,as directed by  Margaree Mackintosh, MD while in the presence of Margaree Mackintosh, MD.   I, Margaree Mackintosh, MD, have reviewed all documentation for this visit. The documentation on 09/26/22 for the exam, diagnosis, procedures, and orders are all accurate and complete.

## 2022-09-22 ENCOUNTER — Other Ambulatory Visit: Payer: Medicare Other

## 2022-09-22 DIAGNOSIS — E039 Hypothyroidism, unspecified: Secondary | ICD-10-CM

## 2022-09-22 DIAGNOSIS — R7302 Impaired glucose tolerance (oral): Secondary | ICD-10-CM | POA: Diagnosis not present

## 2022-09-22 DIAGNOSIS — E785 Hyperlipidemia, unspecified: Secondary | ICD-10-CM | POA: Diagnosis not present

## 2022-09-22 DIAGNOSIS — E1169 Type 2 diabetes mellitus with other specified complication: Secondary | ICD-10-CM

## 2022-09-23 LAB — HEMOGLOBIN A1C
Hgb A1c MFr Bld: 6.1 % of total Hgb — ABNORMAL HIGH (ref <5.7)
Mean Plasma Glucose: 128 mg/dL
eAG (mmol/L): 7.1 mmol/L

## 2022-09-23 LAB — LIPID PANEL
Cholesterol: 152 mg/dL (ref <200)
HDL: 85 mg/dL (ref >=50)
LDL Cholesterol (Calc): 54 mg/dL (calc)
Non-HDL Cholesterol (Calc): 67 mg/dL (calc) (ref <130)
Total CHOL/HDL Ratio: 1.8 (calc) (ref <5.0)
Triglycerides: 51 mg/dL (ref <150)

## 2022-09-23 LAB — HEPATIC FUNCTION PANEL
AG Ratio: 1.6 (calc) (ref 1.0–2.5)
ALT: 16 U/L (ref 6–29)
AST: 22 U/L (ref 10–35)
Albumin: 4.2 g/dL (ref 3.6–5.1)
Alkaline phosphatase (APISO): 73 U/L (ref 37–153)
Bilirubin, Direct: 0.1 mg/dL (ref 0.0–0.2)
Globulin: 2.7 g/dL (calc) (ref 1.9–3.7)
Indirect Bilirubin: 0.2 mg/dL (calc) (ref 0.2–1.2)
Total Bilirubin: 0.3 mg/dL (ref 0.2–1.2)
Total Protein: 6.9 g/dL (ref 6.1–8.1)

## 2022-09-23 LAB — TSH: TSH: 1.49 mIU/L (ref 0.40–4.50)

## 2022-09-26 ENCOUNTER — Ambulatory Visit (INDEPENDENT_AMBULATORY_CARE_PROVIDER_SITE_OTHER): Payer: Medicare Other | Admitting: Internal Medicine

## 2022-09-26 ENCOUNTER — Encounter: Payer: Self-pay | Admitting: Internal Medicine

## 2022-09-26 VITALS — BP 118/78 | HR 74 | Temp 98.1°F | Resp 16 | Ht 63.0 in | Wt 176.8 lb

## 2022-09-26 DIAGNOSIS — Z853 Personal history of malignant neoplasm of breast: Secondary | ICD-10-CM | POA: Diagnosis not present

## 2022-09-26 DIAGNOSIS — F439 Reaction to severe stress, unspecified: Secondary | ICD-10-CM

## 2022-09-26 DIAGNOSIS — I10 Essential (primary) hypertension: Secondary | ICD-10-CM | POA: Diagnosis not present

## 2022-09-26 DIAGNOSIS — R7302 Impaired glucose tolerance (oral): Secondary | ICD-10-CM | POA: Diagnosis not present

## 2022-09-26 DIAGNOSIS — R609 Edema, unspecified: Secondary | ICD-10-CM | POA: Diagnosis not present

## 2022-09-26 DIAGNOSIS — E785 Hyperlipidemia, unspecified: Secondary | ICD-10-CM

## 2022-09-26 DIAGNOSIS — E1169 Type 2 diabetes mellitus with other specified complication: Secondary | ICD-10-CM

## 2022-09-26 DIAGNOSIS — E039 Hypothyroidism, unspecified: Secondary | ICD-10-CM | POA: Diagnosis not present

## 2022-09-26 NOTE — Patient Instructions (Signed)
We are sorry to hear about your husband's illness. Labs are stable. RTC in November for health maintenance exam or as needed.Continue current meds.

## 2022-09-27 LAB — MICROALBUMIN / CREATININE URINE RATIO
Creatinine, Urine: 52 mg/dL (ref 20–275)
Microalb, Ur: <0.2 mg/dL

## 2022-10-18 ENCOUNTER — Other Ambulatory Visit: Payer: Self-pay | Admitting: Internal Medicine

## 2023-01-02 ENCOUNTER — Other Ambulatory Visit: Payer: Self-pay | Admitting: Cardiovascular Disease

## 2023-01-22 ENCOUNTER — Other Ambulatory Visit: Payer: Self-pay | Admitting: Internal Medicine

## 2023-01-22 DIAGNOSIS — Z1231 Encounter for screening mammogram for malignant neoplasm of breast: Secondary | ICD-10-CM

## 2023-02-01 ENCOUNTER — Telehealth: Payer: Self-pay | Admitting: Cardiovascular Disease

## 2023-02-01 MED ORDER — HYDROCHLOROTHIAZIDE 12.5 MG PO CAPS: 12.5000 mg | ORAL_CAPSULE | Freq: Every day | ORAL | 1 refills | Status: DC

## 2023-02-01 NOTE — Telephone Encounter (Signed)
Pt's medication was sent to pt's pharmacy as requested. Confirmation received.  °

## 2023-02-01 NOTE — Telephone Encounter (Signed)
*  STAT* If patient is at the pharmacy, call can be transferred to refill team.   1. Which medications need to be refilled? (please list name of each medication and dose if known)   hydrochlorothiazide (MICROZIDE) 12.5 MG capsule    2. Which pharmacy/location (including street and city if local pharmacy) is medication to be sent to?  [facility] - [place], [place] - [address]      3. Do they need a 30 day or 90 day supply? 90 day    Pt is out of medication

## 2023-02-25 NOTE — Progress Notes (Signed)
Cardiology Office Note:    Date:  03/07/2023   ID:  Victoria Hess, DOB March 29, 1946, MRN [id number]  PCP:  Margaree Mackintosh, MD  Cardiologist:  Thurmon Fair, MD     Referring MD: Margaree Mackintosh, MD   Chief Complaint: follow-up of palpitations and subclavian artery stenosis   History of Present Illness:    Victoria Hess is a 77 y.o. female with a history of palpitations, left subclavian artery stenosis, hypertension hyperlipidemia, borderline type 2 diabetes mellitus, hypothyroidism, and bilateral breast cancer s/p lumpectomy and radiation who is followed by Dr. Royann Shivers and presents today for routine follow-up.   Patient was referred to Dr. Royann Shivers in 09/2020 for evaluation of exertional dyspnea and rare episodes of chest pain over the years. Echo and Myoview were ordered for further evaluation. Echo showed LVEF of 65-70% with normal wall motion and diastolic parameters and no significant valvular disease. Carotid dopplers were also ordered due to bruit on exam and showed no evidence of carotid disease but did show a stenotic left subclavian artery.   She was last seen by Dr. Royann Shivers in 09/2021 at which time she reported occasional random palpitations but was overall doing well. PRN beta-blocker was offered but patient declined.   Patient presents today for follow-up. Here alone. Patient report ongoing random palpitations but states they are stable. She actually thinks the are occurring less frequently than before or at least she does not notice them as much. She reports occasional mild brief lightheadedness/ dizziness not associated with the palpitations and occurring randomly. No syncope. No chest pain, shortness of breath, orthopnea, or PND. She has some swelling in her left leg from a prior injury after a fall in 2011 but this is stable. No other edema.   Of note, she has had a very challenging year so far. Her husband was diagnosed with pancreatic cancer in 06/26/22 and died a couple  of months ago. She is understandably still grieving.   EKGs/Labs/Other Studies Reviewed:    The following studies were reviewed:  Carotid Dopplers 10/25/2020: Summary:  - Right Carotid: There is no evidence of stenosis in the right ICA. The extracranial vessels were near-normal with only minimal wall thickening or plaque.  - Left Carotid: There is no evidence of stenosis in the left ICA. The extracranial vessels were near-normal with only minimal wall thickening or plaque.  - Vertebrals:  Bilateral low resistant atypical flow, antegrade flow.  - Subclavians: Left subclavian artery was stenotic. Normal flow hemodynamics were seen in the right subclavian artery. See above, left brachial artery.  _______________  Echocardiogram 11/03/2020: Impressions:  1. Left ventricular ejection fraction, by estimation, is 65 to 70%. The  left ventricle has normal function. The left ventricle has no regional  wall motion abnormalities. Left ventricular diastolic parameters were  normal.   2. Right ventricular systolic function is normal. The right ventricular  size is normal. Tricuspid regurgitation signal is inadequate for assessing  PA pressure.   3. The mitral valve is normal in structure. Trivial mitral valve  regurgitation. No evidence of mitral stenosis.   4. The aortic valve is tricuspid. Aortic valve regurgitation is not  visualized. No aortic stenosis is present.   5. The inferior vena cava is normal in size with greater than 50%  respiratory variability, suggesting right atrial pressure of 3 mmHg.  _______________  Myoview 11/03/2020: Impressions: The left ventricular ejection fraction is normal (55-65%). Nuclear stress EF: 63%. There was no ST segment deviation noted  during stress. No T wave inversion was noted during stress. The study is normal. This is a low risk study.   EKG:  EKG ordered today.  EKG Interpretation Date/Time:  Wednesday March 07 2023 09:28:09 EDT Ventricular  Rate:  58 PR Interval:  174 QRS Duration:  88 QT Interval:  408 QTC Calculation: 400 R Axis:   -23  Text Interpretation: Sinus bradycardia with Premature atrial complexes T wave inversions in inferior and anterior leads No acute changes compared to prior tracings Confirmed by Marjie Skiff [phone]) on 03/07/2023 9:42:41 AM    Recent Labs: 03/24/2022: BUN 15; Creat 0.78; Hemoglobin 10.9; Platelets 262; Potassium 5.1; Sodium 140 09/22/2022: ALT 16; TSH 1.49  Recent Lipid Panel    Component Value Date/Time   CHOL 152 09/22/2022 0930   TRIG 51 09/22/2022 0930   HDL 85 09/22/2022 0930   CHOLHDL 1.8 09/22/2022 0930   VLDL 9 11/10/2016 1103   LDLCALC 54 09/22/2022 0930    Physical Exam:    Vital Signs: BP 121/75 (BP Location: Right Arm, Patient Position: Sitting, Cuff Size: Large)   Pulse (!) 58   Ht 5\' 3"  (1.6 m)   Wt 169 lb (76.7 kg)   SpO2 95%   BMI 29.94 kg/m     Wt Readings from Last 3 Encounters:  03/07/23 169 lb (76.7 kg)  09/26/22 176 lb 12 oz (80.2 kg)  03/28/22 178 lb 1.9 oz (80.8 kg)     General: 77 y.o. African-American female in no acute distress. HEENT: Normocephalic and atraumatic. Sclera clear.  Neck: Supple. Left carotid bruit. No JVD. Heart: RRR. Distinct S1 and S2. No murmurs, gallops, or rubs.  Lungs: No increased work of breathing. Clear to ausculation bilaterally. No wheezes, rhonchi, or rales.  Extremities: Mild left lower extremity edema (from prior injury). No edema of right lower extremity edema. Radial pulses 2+ and equal bilaterally. Skin: Warm and dry. Neuro: No focal deficits. Psych: Normal affect. Responds appropriately.   Assessment:    1. Palpitations   2. Stenosis of left subclavian artery (HCC)   3. Hypertension, unspecified type   4. Hyperlipidemia, unspecified hyperlipidemia type     Plan:    Palpitations She has a history of infrequent palpitations.  - Stable.   Left Subclavian Artery Stenosis Carotid dopplers in 10/2020  showed no evidence of carotid disease but did show a stenotic left subclavian artery.  - Left bruit noted on exam.  - Asymptomatic.  - Continue aspirin and statin.  - Recommended repeat carotid dopplers but patient asked to hold off on this right now because she has "a lot going on."  Hypertension BP well controlled.  - Continue current medications: Amlodipine 10mg  daily, HCTZ 12.5mg  daily, and Nebivolol 20mg  daily.  - Also on Lasix 40mg  as needed for edema. She has not needed this in a long time.  Hyperlipidemia Lipid panel in 09/2022: Total Cholesterol 152, Triglycerides 51, HDL 85, LDL 54.  - Continue Crestor 10mg  daily.   Disposition: Follow up in 1 year.   Signed, Corrin Parker, PA-C  03/07/2023 1:05 PM    [facility]

## 2023-03-07 ENCOUNTER — Encounter: Payer: Self-pay | Admitting: Student

## 2023-03-07 ENCOUNTER — Ambulatory Visit: Payer: Medicare Other | Attending: Student | Admitting: Student

## 2023-03-07 VITALS — BP 121/75 | HR 58 | Ht 63.0 in | Wt 169.0 lb

## 2023-03-07 DIAGNOSIS — I771 Stricture of artery: Secondary | ICD-10-CM

## 2023-03-07 DIAGNOSIS — R002 Palpitations: Secondary | ICD-10-CM | POA: Diagnosis not present

## 2023-03-07 DIAGNOSIS — I10 Essential (primary) hypertension: Secondary | ICD-10-CM

## 2023-03-07 DIAGNOSIS — E785 Hyperlipidemia, unspecified: Secondary | ICD-10-CM | POA: Diagnosis not present

## 2023-03-07 NOTE — Patient Instructions (Signed)
Medication Instructions:  The current medical regimen is effective;  continue present plan and medications as directed. Please refer to the Current Medication list given to you today.  *If you need a refill on your cardiac medications before your next appointment, please call your pharmacy*  Lab Work: BMET TODAY If you have labs (blood work) drawn today and your tests are completely normal, you will receive your results only by: MyChart Message (if you have MyChart) OR A paper copy in the mail If you have any lab test that is abnormal or we need to change your treatment, we will call you to review the results.  Follow-Up: At [facility], you and your health needs are our priority.  As part of our continuing mission to provide you with exceptional heart care, we have created designated Provider Care Teams.  These Care Teams include your primary Cardiologist (physician) and Advanced Practice Providers (APPs -  Physician Assistants and Nurse Practitioners) who all work together to provide you with the care you need, when you need it.  Your next appointment:   12 month(s)  Provider:   Thurmon Fair, MD  or Marjie Skiff, PA-C        Other Instructions

## 2023-03-08 ENCOUNTER — Other Ambulatory Visit: Payer: Self-pay

## 2023-03-08 LAB — BASIC METABOLIC PANEL
BUN/Creatinine Ratio: 20 (ref 12–28)
BUN: 15 mg/dL (ref 8–27)
CO2: 27 mmol/L (ref 20–29)
Calcium: 10.3 mg/dL (ref 8.7–10.3)
Chloride: 102 mmol/L (ref 96–106)
Creatinine, Ser: 0.75 mg/dL (ref 0.57–1.00)
Glucose: 95 mg/dL (ref 70–99)
Potassium: 4.5 mmol/L (ref 3.5–5.2)
Sodium: 142 mmol/L (ref 134–144)
eGFR: 82 mL/min/{1.73_m2} (ref >59)

## 2023-03-08 MED ORDER — LEVOTHYROXINE SODIUM 50 MCG PO TABS: 50.0000 ug | ORAL_TABLET | Freq: Every day | ORAL | 5 refills | Status: DC

## 2023-03-09 ENCOUNTER — Ambulatory Visit
Admission: RE | Admit: 2023-03-09 | Discharge: 2023-03-09 | Disposition: A | Discharge status: Home / Self Care | Payer: Medicare Other | Source: Ambulatory Visit | Attending: Internal Medicine | Admitting: Internal Medicine

## 2023-03-09 DIAGNOSIS — Z1231 Encounter for screening mammogram for malignant neoplasm of breast: Secondary | ICD-10-CM | POA: Diagnosis not present

## 2023-03-20 ENCOUNTER — Other Ambulatory Visit: Payer: Self-pay

## 2023-03-20 MED ORDER — LEVOTHYROXINE SODIUM 50 MCG PO TABS: 50.0000 ug | ORAL_TABLET | Freq: Every day | ORAL | 1 refills | Status: DC

## 2023-03-21 ENCOUNTER — Other Ambulatory Visit: Payer: Self-pay | Admitting: Cardiovascular Disease

## 2023-03-22 NOTE — Progress Notes (Signed)
Annual Wellness Visit    Patient Care Team: Victorious Kundinger, Luanna Cole, MD as PCP - General (Internal Medicine) Thurmon Fair, MD as PCP - Cardiology (Cardiology)  Visit Date: 03/30/23   Chief Complaint  Patient presents with   Medicare Wellness   Annual Exam    Subjective:   Patient: Victoria Hess, Female    DOB: [birth year], 77 y.o.   MRN: [id number]  Victoria Hess is a 77 y.o. Female who presents today for her Annual Wellness Visit. Patient has a past medical history of stage 2 bilateral breast cancer, Type 2 diabetes mellitus, hyperlipidemia, hypertension, hypothyroidism.   History of Type 2 diabetes mellitus treated with metformin 500 mg twice daily with meals. HGBA1c at 6.2% on 03/26/23, up from 6.1% on 09/22/22.   History of hyperlipidemia treated with rosuvastatin 10 mg daily in the evening. Takes potassium chloride 20 meq twice daily. Lipid panel normal.   History of hypertension treated with nebivolol 20 mg daily, hydrochlorothiazide 12.5 mg daily, amlodipine 10 mg daily. Blood pressure normal today at 120/80.   History of lower extremity edema treated with furosemide 40 mg daily as needed.   History of hypothyroidism treated with levothyroxine 50 mcg daily. TSH at 2.9.   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  She had wide excision procedure of the left breast in 2011 and a lesser procedure done on the right breast in 2002.  Has done well since that time.   Tonsillectomy and adenoidectomy in 1955.  Appendectomy and tubal ligation 1977.  History of uterine fibroids, cystocele and uterine prolapse.   She is followed by cardiologist for shortness of breath and chest discomfort. Carotid Dopplers obtained in June 2022 showed no significant carotid artery disease but left subclavian artery stenosis was noted. She had a Myoview study done in June 2022 with ejection fraction of 63%.    Dr. Royann Shivers who saw her in November  2022 for evaluation patient was complaining of some dyspnea on exertion and precordial pain. He did not feel that she had angina and she had a normal nuclear perfusion study in June 2022. She also had an echocardiogram in June 2022 to evaluate murmur but no valvular stenosis was detected. 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 What was thought to be a carotid bruit was actually a subclavian bruit. No further workup was advised.    Discussed situational stress relating to husband's and brother's recent passing from terminal pancreatic cancer, lung cancer. Anxiety and sleep medication offered and she declines. Has support of family and church minister.   03/26/23 labs reviewed today. Glucose normal. Kidney, liver functions normal. Electrolytes normal. Blood proteins normal. Hemoglobin low at 11. HCT low at 34.7. MCHC low at 31.7.  Mammogram normal on 03/13/23.   Had colonoscopy in 2018 was within normal limits.   Social history: She lives in [place].  Does not smoke or consume alcohol.  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 Has daughter, Laural Benes, who resides in [place] and  is supportive.   Family history: Father died at age 43 of a stroke.  Mother died from coronary disease.  1 brother with history of hypertension and diabetes.  1 sister with hypertension and diabetes.  Another sister with hypertension.  1 sister in good health without medical issues.  Past Medical History:  Diagnosis Date   Breast cancer (HCC)    Cancer (HCC) [phone]   Stage 2 breast cancer bil   Diabetes mellitus without complication (HCC)    Family history of breast cancer    Family history of stomach cancer    Hyperlipidemia    Hypertension    Personal history of radiation therapy 2002,2011   Thyroid disease    hypothyroidism   Wears dentures    partial   Wears  glasses      Family History  Problem Relation Age of Onset   Diabetes Mother    Heart disease Mother    Iron deficiency Mother    Diabetes Father    Iron deficiency Father    Stomach cancer Father 40   Diabetes Sister    Hyperlipidemia Sister    Iron deficiency Sister    Kidney disease Sister    Breast cancer Sister 57       DCIS   Iron deficiency Sister    Hepatitis C Brother    Cancer Brother        hepatocellular   Lung cancer Brother    Breast cancer Maternal Aunt 59   Breast cancer Cousin    Colon cancer Neg Hx          Review of Systems  Constitutional:  Negative for chills, fever, malaise/fatigue and weight loss.  HENT:  Negative for hearing loss, sinus pain and sore throat.   Respiratory:  Negative for cough, hemoptysis and shortness of breath.   Cardiovascular:  Negative for chest pain, palpitations, leg swelling and PND.  Gastrointestinal:  Negative for abdominal pain, constipation, diarrhea, heartburn, nausea and vomiting.  Genitourinary:  Negative for dysuria, frequency and urgency.  Musculoskeletal:  Negative for back pain, myalgias and neck pain.  Skin:  Negative for itching and rash.  Neurological:  Negative for dizziness, tingling, seizures and headaches.  Endo/Heme/Allergies:  Negative for polydipsia.  Psychiatric/Behavioral:  Positive for depression. The patient is nervous/anxious.       Objective:   Vitals: BP 120/80   Pulse (!) 104   Ht 5\' 3"  (1.6 m)   Wt 169 lb (76.7 kg)   SpO2 97%   BMI 29.94 kg/m   Physical Exam Vitals and nursing note reviewed.  Constitutional:      General: She is not in acute distress.    Appearance: Normal appearance. She is not ill-appearing or toxic-appearing.  HENT:     Head: Normocephalic and atraumatic.     Right Ear: Hearing, tympanic membrane, ear canal and external ear normal.     Left Ear: Hearing, tympanic membrane, ear canal and external ear normal.     Mouth/Throat:     Pharynx: Oropharynx is  clear.  Eyes:     Extraocular Movements: Extraocular movements intact.     Pupils: Pupils are equal, round, and reactive to light.  Neck:     Thyroid: No thyroid mass, thyromegaly or thyroid tenderness.     Vascular: No carotid bruit.  Cardiovascular:     Rate and Rhythm: Normal rate and regular rhythm. No extrasystoles are present.    Pulses:          Dorsalis pedis pulses are 2+ on the right side and 2+ on the left side.     Heart sounds: Normal heart sounds. No murmur heard.    No friction rub. No gallop.     Comments: Trace pitting edema bilateral lower extremities. Pulmonary:     Effort: Pulmonary effort is  normal.     Breath sounds: Normal breath sounds. No decreased breath sounds, wheezing, rhonchi or rales.  Chest:     Chest wall: No mass.  Breasts:    Right: No mass.     Left: No mass.  Abdominal:     Palpations: Abdomen is soft. There is no hepatomegaly, splenomegaly or mass.     Tenderness: There is no abdominal tenderness.     Hernia: No hernia is present.  Musculoskeletal:     Cervical back: Normal range of motion.  Lymphadenopathy:     Cervical: No cervical adenopathy.     Upper Body:     Right upper body: No supraclavicular adenopathy.     Left upper body: No supraclavicular adenopathy.  Skin:    General: Skin is warm and dry.  Neurological:     General: No focal deficit present.     Mental Status: She is alert and oriented to person, place, and time. Mental status is at baseline.     Sensory: Sensation is intact.     Motor: Motor function is intact. No weakness.     Deep Tendon Reflexes: Reflexes are normal and symmetric.  Psychiatric:        Attention and Perception: Attention normal.        Mood and Affect: Mood normal.        Speech: Speech normal.        Behavior: Behavior normal.        Thought Content: Thought content normal.        Cognition and Memory: Cognition normal.        Judgment: Judgment normal.      Most recent functional status  assessment:    03/30/2023   10:09 AM  In your present state of health, do you have any difficulty performing the following activities:  Hearing? 0  Vision? 0  Difficulty concentrating or making decisions? 0  Walking or climbing stairs? 0  Dressing or bathing? 0  Doing errands, shopping? 0  Preparing Food and eating ? N  Using the Toilet? N  In the past six months, have you accidently leaked urine? N  Do you have problems with loss of bowel control? N  Managing your Medications? N  Managing your Finances? N  Housekeeping or managing your Housekeeping? N   Most recent fall risk assessment:    03/30/2023   10:11 AM  Fall Risk   Falls in the past year? 1  Number falls in past yr: 0  Injury with Fall? 0  Risk for fall due to : Other (Comment)  Follow up Falls evaluation completed;Education provided    Most recent depression screenings:    03/30/2023    9:55 AM 03/28/2022   11:08 AM  PHQ 2/9 Scores  PHQ - 2 Score 0 0   Most recent cognitive screening:    03/30/2023   10:10 AM  6CIT Screen  What Year? 0 points  What month? 0 points  What time? 0 points  Count back from 20 0 points  Months in reverse 0 points  Repeat phrase 0 points  Total Score 0 points     Results:   Studies obtained and personally reviewed by me:  Mammogram normal on 03/13/23.   Had colonoscopy in 2018 was within normal limits.   Labs:       Component Value Date/Time   NA 140 03/26/2023 0933   NA 142 03/07/2023 1024   NA 141 04/12/2015 1519   K  4.9 03/26/2023 0933   K 3.9 04/12/2015 1519   CL 105 03/26/2023 0933   CL 102 02/29/2012 1517   CO2 29 03/26/2023 0933   CO2 27 04/12/2015 1519   GLUCOSE 91 03/26/2023 0933   GLUCOSE 88 04/12/2015 1519   GLUCOSE 78 02/29/2012 1517   BUN 19 03/26/2023 0933   BUN 15 03/07/2023 1024   BUN 25.4 04/12/2015 1519   CREATININE 0.82 03/26/2023 0933   CREATININE 0.9 04/12/2015 1519   CALCIUM 10.0 03/26/2023 0933   CALCIUM 10.0 04/12/2015 1519    PROT 7.0 03/26/2023 0933   PROT 7.8 04/12/2015 1519   ALBUMIN 3.6 05/17/2018 1017   ALBUMIN 4.0 04/12/2015 1519   AST 18 03/26/2023 0933   AST 14 (L) 05/17/2018 1017   AST 18 04/12/2015 1519   ALT 13 03/26/2023 0933   ALT 11 05/17/2018 1017   ALT 11 04/12/2015 1519   ALKPHOS 111 05/17/2018 1017   ALKPHOS 89 04/12/2015 1519   BILITOT 0.3 03/26/2023 0933   BILITOT <0.2 (L) 05/17/2018 1017   BILITOT <0.30 04/12/2015 1519   GFRNONAA 64 02/24/2020 0915   GFRAA 75 02/24/2020 0915     Lab Results  Component Value Date   WBC 9.3 03/26/2023   HGB 11.0 (L) 03/26/2023   HCT 34.7 (L) 03/26/2023   MCV 85.7 03/26/2023   PLT 266 03/26/2023    Lab Results  Component Value Date   CHOL 166 03/26/2023   HDL 88 03/26/2023   LDLCALC 64 03/26/2023   TRIG 63 03/26/2023   CHOLHDL 1.9 03/26/2023    Lab Results  Component Value Date   HGBA1C 6.2 (H) 03/26/2023     Lab Results  Component Value Date   TSH 2.90 03/26/2023    Assessment & Plan:   Type 2 diabetes mellitus: treated with metformin 500 mg twice daily with meals. HGBA1c at 6.2% on 03/26/23, up from 6.1% on 09/22/22.   Hyperlipidemia: treated with rosuvastatin 10 mg daily in the evening. Takes potassium chloride 20 meq twice daily. Lipid panel normal.   Hypertension: treated with nebivolol 20 mg daily, hydrochlorothiazide 12.5 mg daily, amlodipine 10 mg daily. Blood pressure normal today at 120/80.   Lower extremity edema: treated with furosemide 40 mg daily as needed.    Hypothyroidism: treated with levothyroxine 50 mcg daily. TSH at 2.9.  Normocytic anemia: Hemoglobin low at 11. Colonoscopy 2018 within normal limits. Recommend multivitamin with iron and follow-up at next visit.   History of bilateral ductal carcinoma in situ in 2002 affecting right breast and 2011 affecting left breast.   Pelvic exam deferred at patient's request.  Mammogram normal on 03/13/23.   Had colonoscopy in 2018 was within normal limits.    Vaccine counseling: UTD on flu, Covid-19 booster.  Bereavement: condolences offered. Daughter is supportive.  Return in 6 months or as needed. Take MVI with iron. Hgb AIC stable at 6.2%     Annual wellness visit done today including the all of the following: Reviewed patient's Family Medical History Reviewed and updated list of patient's medical providers Assessment of cognitive impairment was done Assessed patient's functional ability Established a written schedule for health screening services Health Risk Assessent Completed and Reviewed  Discussed health benefits of physical activity, and encouraged her to engage in regular exercise appropriate for her age and condition.        I,Alexander Ruley,acting as a Neurosurgeon for Margaree Mackintosh, MD.,have documented all relevant documentation on the behalf of Margaree Mackintosh, MD,as  directed by  Margaree Mackintosh, MD while in the presence of Margaree Mackintosh, MD.   I, Margaree Mackintosh, MD, have reviewed all documentation for this visit. The documentation on 04/12/23 for the exam, diagnosis, procedures, and orders are all accurate and complete.

## 2023-03-26 ENCOUNTER — Other Ambulatory Visit: Payer: Medicare Other

## 2023-03-26 DIAGNOSIS — E1169 Type 2 diabetes mellitus with other specified complication: Secondary | ICD-10-CM | POA: Diagnosis not present

## 2023-03-26 DIAGNOSIS — E785 Hyperlipidemia, unspecified: Secondary | ICD-10-CM | POA: Diagnosis not present

## 2023-03-26 DIAGNOSIS — E039 Hypothyroidism, unspecified: Secondary | ICD-10-CM | POA: Diagnosis not present

## 2023-03-26 DIAGNOSIS — Z0000 Encounter for general adult medical examination without abnormal findings: Secondary | ICD-10-CM

## 2023-03-26 DIAGNOSIS — E8881 Metabolic syndrome: Secondary | ICD-10-CM

## 2023-03-26 DIAGNOSIS — I10 Essential (primary) hypertension: Secondary | ICD-10-CM

## 2023-03-26 DIAGNOSIS — F439 Reaction to severe stress, unspecified: Secondary | ICD-10-CM

## 2023-03-27 LAB — CBC WITH DIFFERENTIAL/PLATELET
Absolute Lymphocytes: 1507 {cells}/uL (ref 850–3900)
Absolute Monocytes: 725 {cells}/uL (ref 200–950)
Basophils Absolute: 37 {cells}/uL (ref 0–200)
Basophils Relative: 0.4 %
Eosinophils Absolute: 158 {cells}/uL (ref 15–500)
Eosinophils Relative: 1.7 %
HCT: 34.7 % — ABNORMAL LOW (ref 35.0–45.0)
Hemoglobin: 11 g/dL — ABNORMAL LOW (ref 11.7–15.5)
MCH: 27.2 pg (ref 27.0–33.0)
MCHC: 31.7 g/dL — ABNORMAL LOW (ref 32.0–36.0)
MCV: 85.7 fL (ref 80.0–100.0)
MPV: 12.5 fL (ref 7.5–12.5)
Monocytes Relative: 7.8 %
Neutro Abs: 6873 {cells}/uL (ref 1500–7800)
Neutrophils Relative %: 73.9 %
Platelets: 266 10*3/uL (ref 140–400)
RBC: 4.05 10*6/uL (ref 3.80–5.10)
RDW: 14.6 % (ref 11.0–15.0)
Total Lymphocyte: 16.2 %
WBC: 9.3 10*3/uL (ref 3.8–10.8)

## 2023-03-27 LAB — COMPLETE METABOLIC PANEL WITH GFR
AG Ratio: 1.5 (calc) (ref 1.0–2.5)
ALT: 13 U/L (ref 6–29)
AST: 18 U/L (ref 10–35)
Albumin: 4.2 g/dL (ref 3.6–5.1)
Alkaline phosphatase (APISO): 73 U/L (ref 37–153)
BUN: 19 mg/dL (ref 7–25)
CO2: 29 mmol/L (ref 20–32)
Calcium: 10 mg/dL (ref 8.6–10.4)
Chloride: 105 mmol/L (ref 98–110)
Creat: 0.82 mg/dL (ref 0.60–1.00)
Globulin: 2.8 g/dL (ref 1.9–3.7)
Glucose, Bld: 91 mg/dL (ref 65–99)
Potassium: 4.9 mmol/L (ref 3.5–5.3)
Sodium: 140 mmol/L (ref 135–146)
Total Bilirubin: 0.3 mg/dL (ref 0.2–1.2)
Total Protein: 7 g/dL (ref 6.1–8.1)
eGFR: 74 mL/min/{1.73_m2} (ref >=60)

## 2023-03-27 LAB — LIPID PANEL
Cholesterol: 166 mg/dL (ref <200)
HDL: 88 mg/dL (ref >=50)
LDL Cholesterol (Calc): 64 mg/dL
Non-HDL Cholesterol (Calc): 78 mg/dL (ref <130)
Total CHOL/HDL Ratio: 1.9 (calc) (ref <5.0)
Triglycerides: 63 mg/dL (ref <150)

## 2023-03-27 LAB — HEMOGLOBIN A1C
Hgb A1c MFr Bld: 6.2 %{Hb} — ABNORMAL HIGH (ref <5.7)
Mean Plasma Glucose: 131 mg/dL
eAG (mmol/L): 7.3 mmol/L

## 2023-03-27 LAB — TSH: TSH: 2.9 m[IU]/L (ref 0.40–4.50)

## 2023-03-30 ENCOUNTER — Encounter: Payer: Self-pay | Admitting: Internal Medicine

## 2023-03-30 ENCOUNTER — Ambulatory Visit: Payer: Medicare Other | Admitting: Internal Medicine

## 2023-03-30 VITALS — BP 120/80 | HR 104 | Ht 63.0 in | Wt 169.0 lb

## 2023-03-30 DIAGNOSIS — E119 Type 2 diabetes mellitus without complications: Secondary | ICD-10-CM | POA: Diagnosis not present

## 2023-03-30 DIAGNOSIS — Z634 Disappearance and death of family member: Secondary | ICD-10-CM

## 2023-03-30 DIAGNOSIS — Z0000 Encounter for general adult medical examination without abnormal findings: Secondary | ICD-10-CM

## 2023-03-30 DIAGNOSIS — E785 Hyperlipidemia, unspecified: Secondary | ICD-10-CM | POA: Diagnosis not present

## 2023-03-30 DIAGNOSIS — E1169 Type 2 diabetes mellitus with other specified complication: Secondary | ICD-10-CM | POA: Diagnosis not present

## 2023-03-30 DIAGNOSIS — Z853 Personal history of malignant neoplasm of breast: Secondary | ICD-10-CM | POA: Diagnosis not present

## 2023-03-30 DIAGNOSIS — Z6829 Body mass index (BMI) 29.0-29.9, adult: Secondary | ICD-10-CM

## 2023-03-30 DIAGNOSIS — D649 Anemia, unspecified: Secondary | ICD-10-CM | POA: Diagnosis not present

## 2023-03-30 DIAGNOSIS — E039 Hypothyroidism, unspecified: Secondary | ICD-10-CM | POA: Diagnosis not present

## 2023-03-30 DIAGNOSIS — R609 Edema, unspecified: Secondary | ICD-10-CM

## 2023-03-30 DIAGNOSIS — I10 Essential (primary) hypertension: Secondary | ICD-10-CM

## 2023-03-30 LAB — POCT URINALYSIS DIP (CLINITEK)
Bilirubin, UA: NEGATIVE
Blood, UA: NEGATIVE
Glucose, UA: NEGATIVE mg/dL
Ketones, POC UA: NEGATIVE mg/dL
Leukocytes, UA: NEGATIVE
Nitrite, UA: NEGATIVE
POC PROTEIN,UA: NEGATIVE
Spec Grav, UA: 1.015 (ref 1.010–1.025)
Urobilinogen, UA: 0.2 U/dL
pH, UA: 6 (ref 5.0–8.0)

## 2023-03-30 NOTE — Patient Instructions (Addendum)
We are sorry to hear of your husband's passing as well as your brother. Please take care of yourself and call if you need anything at all. Continue current meds and return in 6 months for recheck on glucose intolerance. Monitor your weight at home and let us know if you continue to lose weight.

## 2023-03-31 ENCOUNTER — Other Ambulatory Visit: Payer: Self-pay | Admitting: Internal Medicine

## 2023-03-31 LAB — MICROALBUMIN / CREATININE URINE RATIO
Creatinine, Urine: 135 mg/dL (ref 20–275)
Microalb Creat Ratio: 59 mg/g{creat} — ABNORMAL HIGH (ref <30)
Microalb, Ur: 8 mg/dL

## 2023-04-02 ENCOUNTER — Other Ambulatory Visit: Payer: Self-pay | Admitting: Cardiovascular Disease

## 2023-04-11 DIAGNOSIS — H43813 Vitreous degeneration, bilateral: Secondary | ICD-10-CM | POA: Diagnosis not present

## 2023-04-11 DIAGNOSIS — E119 Type 2 diabetes mellitus without complications: Secondary | ICD-10-CM | POA: Diagnosis not present

## 2023-04-11 DIAGNOSIS — H524 Presbyopia: Secondary | ICD-10-CM | POA: Diagnosis not present

## 2023-04-11 DIAGNOSIS — H2513 Age-related nuclear cataract, bilateral: Secondary | ICD-10-CM | POA: Diagnosis not present

## 2023-04-25 LAB — HM DIABETES EYE EXAM

## 2023-05-11 ENCOUNTER — Other Ambulatory Visit: Payer: Self-pay | Admitting: Internal Medicine

## 2023-05-17 ENCOUNTER — Ambulatory Visit: Payer: Medicare Other | Admitting: Internal Medicine

## 2023-05-17 ENCOUNTER — Encounter: Payer: Medicare Other | Admitting: Internal Medicine

## 2023-07-01 ENCOUNTER — Other Ambulatory Visit: Payer: Self-pay | Admitting: Internal Medicine

## 2023-09-27 NOTE — Progress Notes (Signed)
 Patient Care Team: Sylvan Evener, MD as PCP - General (Internal Medicine) Luana Rumple, MD as PCP - Cardiology (Cardiology)  Visit Date: 09/28/23  Subjective:   Chief Complaint  Patient presents with   Diabetes  Patient BP:ZWCHENID B Pecha,Female DOB:[birth year],78 y.o. MRN:[id number]   78 y.o.Female presents today for 6 months follow-up for Hypertension, Hyperlipidemia, Diabetes Mellitus II; Hypothyroidism. Seen 11/15//2024 for her annual visit, in the interim has seen Optometry.   History of Hypertension treated with Amlodipine  10 mg daily Nebivolol  HCL 20 mg daily. Blood Pressure: normotensive today at 120/80. Dependent Edema treated with HCTZ 12.5 mg daily and  Lasix  40 mg as needed. History of Left Subclavian Stenosis heard as a left carotid bruit on exam - Carotid US  2020/11/13 did not find any evidence of stenosis in right and left ICA with only minimal wall thickening or plaque in extracranial vessels. Noted bilateral low resistant atypical flow, antegrade flow in the vertebrales. Found the left subclavian artery to be stenotic, but normal flow hemodynamics in the right subclavian.    History of Hyperlipidemia treated with Rosuvastatin  10 mg daily.    History of Diabetes Mellitus, type II treated with Metformin  500 mg twice daily. Collecting A1c today.   History of Hypothyroidism treated with Levothyroxine  50 mcg daily. 03/26/2023 TSH: 2.90.  History of Bilateral Breast Cancer, stage 2; Bilateral Ductal Carcinoma in Situ S/p Right Breat Lumpectomy Nov 13, 2000 and S/p Wide Excision, Left Breast in 11-13-09.   History of BMI 25+, today 30.11 weighing 170 pounds. Has only gained 1 pound since November.  Past Medical History:  Diagnosis Date   Breast cancer (HCC)    Cancer (HCC) [phone]   Stage 2 breast cancer bil   Diabetes mellitus without complication (HCC)    Family history of breast cancer    Family history of stomach cancer    Hyperlipidemia    Hypertension    Personal history  of radiation therapy 11-13-2000   Thyroid  disease    hypothyroidism   Wears dentures    partial   Wears glasses     Allergies  Allergen Reactions   Irbesartan Cough and Other (See Comments)    Other Reaction: CONGESTION    Family History  Problem Relation Age of Onset   Diabetes Mother    Heart disease Mother    Iron deficiency Mother    Diabetes Father    Iron deficiency Father    Stomach cancer Father 20   Diabetes Sister    Hyperlipidemia Sister    Iron deficiency Sister    Kidney disease Sister    Breast cancer Sister 11       DCIS   Iron deficiency Sister    Hepatitis C Brother    Cancer Brother        hepatocellular   Lung cancer Brother    Breast cancer Maternal Aunt 56   Breast cancer Cousin    Colon cancer Neg Hx    Social History   Social History Narrative   Not on file  Husband passed away 11-14-22 from Pancreatic Cancer  Review of Systems  All other systems reviewed and are negative.    Objective:  Vitals: BP 120/80   Pulse (!) 58   Ht 5\' 3"  (1.6 m)   Wt 170 lb (77.1 kg)   SpO2 99%   BMI 30.11 kg/m   Physical Exam Vitals and nursing note reviewed.  Constitutional:      General: She is not in acute distress.  Appearance: Normal appearance. She is not toxic-appearing.  HENT:     Head: Normocephalic and atraumatic.  Cardiovascular:     Rate and Rhythm: Normal rate and regular rhythm. No extrasystoles are present.    Pulses: Normal pulses.          Carotid pulses are  on the left side with bruit.    Heart sounds: Normal heart sounds. No murmur heard.    No friction rub. No gallop.  Pulmonary:     Effort: Pulmonary effort is normal. No respiratory distress.     Breath sounds: Normal breath sounds. No wheezing or rales.  Skin:    General: Skin is warm and dry.  Neurological:     Mental Status: She is alert and oriented to person, place, and time. Mental status is at baseline.  Psychiatric:        Mood and Affect: Mood normal.         Behavior: Behavior normal.        Thought Content: Thought content normal.        Judgment: Judgment normal.     Results:  Studies Obtained And Personally Reviewed By Me:  Carotid Arterial Duplex Study 10/25/2020  Summary:  Right Carotid: There is no evidence of stenosis in the right ICA. The extracranial vessels were near-normal with only minimal wall thickening or plaque.  Left Carotid: There is no evidence of stenosis in the left ICA. The extracranial vessels were near-normal with only minimal wall thickening or plaque.  Vertebrals:  Bilateral low resistant atypical flow, antegrade flow.  Subclavians: Left subclavian artery was stenotic. Normal flow hemodynamics were seen in the right subclavian artery. Left brachial artery, antecubital fossa 45 cm/s monophasic flow.  Labs:     Component Value Date/Time   NA 140 03/26/2023 0933   NA 142 03/07/2023 1024   NA 141 04/12/2015 1519   K 4.9 03/26/2023 0933   K 3.9 04/12/2015 1519   CL 105 03/26/2023 0933   CL 102 02/29/2012 1517   CO2 29 03/26/2023 0933   CO2 27 04/12/2015 1519   GLUCOSE 91 03/26/2023 0933   GLUCOSE 88 04/12/2015 1519   GLUCOSE 78 02/29/2012 1517   BUN 19 03/26/2023 0933   BUN 15 03/07/2023 1024   BUN 25.4 04/12/2015 1519   CREATININE 0.82 03/26/2023 0933   CREATININE 0.9 04/12/2015 1519   CALCIUM  10.0 03/26/2023 0933   CALCIUM  10.0 04/12/2015 1519   PROT 7.0 03/26/2023 0933   PROT 7.8 04/12/2015 1519   ALBUMIN 3.6 05/17/2018 1017   ALBUMIN 4.0 04/12/2015 1519   AST 18 03/26/2023 0933   AST 14 (L) 05/17/2018 1017   AST 18 04/12/2015 1519   ALT 13 03/26/2023 0933   ALT 11 05/17/2018 1017   ALT 11 04/12/2015 1519   ALKPHOS 111 05/17/2018 1017   ALKPHOS 89 04/12/2015 1519   BILITOT 0.3 03/26/2023 0933   BILITOT <0.2 (L) 05/17/2018 1017   BILITOT <0.30 04/12/2015 1519   GFRNONAA 64 02/24/2020 0915   GFRAA 75 02/24/2020 0915    Lab Results  Component Value Date   WBC 9.3 03/26/2023   HGB 11.0 (L)  03/26/2023   HCT 34.7 (L) 03/26/2023   MCV 85.7 03/26/2023   PLT 266 03/26/2023   Lab Results  Component Value Date   CHOL 166 03/26/2023   HDL 88 03/26/2023   LDLCALC 64 03/26/2023   TRIG 63 03/26/2023   CHOLHDL 1.9 03/26/2023   Lab Results  Component Value Date  HGBA1C 6.2 (H) 03/26/2023    Lab Results  Component Value Date   TSH 2.90 03/26/2023   Assessment & Plan:   Orders Placed This Encounter  Procedures   Hemoglobin A1c  Diabetes Mellitus, type II treated with Metformin  500 mg twice daily. Collecting A1c today.   Hypertension treated with Amlodipine  10 mg daily Nebivolol  HCL 20 mg daily. Blood Pressure: normotensive today at 120/80.  Dependent Edema treated with HCTZ 12.5 mg daily and Lasix  40 mg as needed.   Left Subclavian Stenosis heard as a left carotid bruit on exam - Carotid US  10/2020 did not find any evidence of stenosis in right and left ICA with only minimal wall thickening or plaque in extracranial vessels. Noted bilateral low resistant atypical flow, antegrade flow in the vertebrales. Found the left subclavian artery to be stenotic, but normal flow hemodynamics in the right subclavian.    Hyperlipidemia treated with Rosuvastatin  10 mg daily.    Hypothyroidism treated with Levothyroxine  50 mcg daily. 03/26/2023 TSH: 2.90.  History of Bilateral Breast Cancer, stage 2; Bilateral Ductal Carcinoma in Situ S/p Right Breat Lumpectomy 2002 and S/p Wide Excision, Left Breast in 2011.   BMI 25+, today 30.11 weighing 170 pounds. Has only gained 1 pound since November.  Return in 26 weeks (on 03/28/2024) for for annual labs and then on 03/31/2024 for annual visit.    I,Emily Lagle,acting as a Neurosurgeon for Sylvan Evener, MD.,have documented all relevant documentation on the behalf of Sylvan Evener, MD,as directed by  Sylvan Evener, MD while in the presence of Sylvan Evener, MD.   I, Sylvan Evener, MD, have reviewed all documentation for this visit. The documentation  on 10/08/23 for the exam, diagnosis, procedures, and orders are all accurate and complete.

## 2023-09-28 ENCOUNTER — Ambulatory Visit: Payer: Medicare Other | Admitting: Internal Medicine

## 2023-09-28 ENCOUNTER — Encounter: Payer: Self-pay | Admitting: Internal Medicine

## 2023-09-28 VITALS — BP 120/80 | HR 58 | Ht 63.0 in | Wt 170.0 lb

## 2023-09-28 DIAGNOSIS — E119 Type 2 diabetes mellitus without complications: Secondary | ICD-10-CM

## 2023-09-28 DIAGNOSIS — Z6830 Body mass index (BMI) 30.0-30.9, adult: Secondary | ICD-10-CM

## 2023-09-28 DIAGNOSIS — E785 Hyperlipidemia, unspecified: Secondary | ICD-10-CM

## 2023-09-28 DIAGNOSIS — Z7984 Long term (current) use of oral hypoglycemic drugs: Secondary | ICD-10-CM

## 2023-09-28 DIAGNOSIS — Z853 Personal history of malignant neoplasm of breast: Secondary | ICD-10-CM

## 2023-09-28 DIAGNOSIS — E1169 Type 2 diabetes mellitus with other specified complication: Secondary | ICD-10-CM | POA: Diagnosis not present

## 2023-09-28 DIAGNOSIS — E039 Hypothyroidism, unspecified: Secondary | ICD-10-CM | POA: Diagnosis not present

## 2023-09-28 DIAGNOSIS — R609 Edema, unspecified: Secondary | ICD-10-CM

## 2023-09-28 DIAGNOSIS — I771 Stricture of artery: Secondary | ICD-10-CM

## 2023-09-28 DIAGNOSIS — I10 Essential (primary) hypertension: Secondary | ICD-10-CM

## 2023-09-29 ENCOUNTER — Ambulatory Visit: Payer: Self-pay | Admitting: Internal Medicine

## 2023-09-29 LAB — HEMOGLOBIN A1C
Hgb A1c MFr Bld: 6.2 % — ABNORMAL HIGH (ref <5.7)
Mean Plasma Glucose: 131 mg/dL
eAG (mmol/L): 7.3 mmol/L

## 2023-10-08 NOTE — Patient Instructions (Signed)
 It was a pleasure to see you today. Labs are stable. Continue current meds and follow up in 6 months.

## 2023-10-15 ENCOUNTER — Other Ambulatory Visit: Payer: Self-pay | Admitting: Internal Medicine

## 2023-12-15 ENCOUNTER — Other Ambulatory Visit: Payer: Self-pay | Admitting: Cardiovascular Disease

## 2024-01-20 ENCOUNTER — Other Ambulatory Visit: Payer: Self-pay | Admitting: Internal Medicine

## 2024-02-06 ENCOUNTER — Other Ambulatory Visit: Payer: Self-pay | Admitting: Internal Medicine

## 2024-02-06 DIAGNOSIS — Z1231 Encounter for screening mammogram for malignant neoplasm of breast: Secondary | ICD-10-CM

## 2024-03-10 ENCOUNTER — Other Ambulatory Visit: Payer: Self-pay | Admitting: Cardiovascular Disease

## 2024-03-10 ENCOUNTER — Ambulatory Visit
Admission: RE | Admit: 2024-03-10 | Discharge: 2024-03-10 | Disposition: A | Discharge status: Home / Self Care | Source: Ambulatory Visit | Attending: Internal Medicine | Admitting: Internal Medicine

## 2024-03-10 DIAGNOSIS — Z1231 Encounter for screening mammogram for malignant neoplasm of breast: Secondary | ICD-10-CM | POA: Diagnosis not present

## 2024-03-18 ENCOUNTER — Other Ambulatory Visit: Payer: Self-pay | Admitting: Internal Medicine

## 2024-03-25 NOTE — Progress Notes (Addendum)
 Annual Wellness Visit   Patient Care Team: Jonee Lamore, Ronal PARAS, MD as PCP - General (Internal Medicine) Francyne Headland, MD as PCP - Cardiology (Cardiology)  Visit Date: 03/31/24   Chief Complaint  Patient presents with   Annual Exam   Medicare Wellness   Subjective:  Patient: Victoria Hess, Female DOB: 08-19-1945, 78 y.o. MRN: [id number] Vitals:   03/31/24 1400  BP: 110/80   Victoria Hess is a 78 y.o. Female who presents today for her Annual Wellness Visit. Patient has HTN (hypertension); Hyperlipidemia; Anemia; Hypothyroidism; Obesity; Type 2 diabetes mellitus with obesity; Breast cancer, left breast (HCC); Metabolic syndrome; DCIS (ductal carcinoma in situ) of breast; Well adult exam; Family history of breast cancer; Family history of stomach cancer; Personal history of breast cancer; and Genetic testing on their problem list.   She is moving to [place] soon.    Left carotid bruit heard on physical exam.   History of Diabetes mellitus, Type II treated with Metformin  500 mg twice daily with meals. 03/29/2023 HgbA1c 6.0%,   History of hyperlipidemia treated with Rosuvastatin  10 mg daily in the evening. Takes Klor-con  20 meq twice daily. 03/28/2024 Lipid Panel normal.    History of hypertension treated with Nebivolol  20 mg daily, Hydrochlorothiazide  12.5 mg daily, Amlodipine  10 mg daily. Blood pressure normal at 110/80.    History of lower extremity edema treated with Furosemide  40 mg daily as needed.   History of hypothyroidism treated with Levothyroxine  50 mcg daily. 03/28/2024 TSH 1.89.    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  She had wide excision procedure of the left breast in 2011 and a lesser procedure done on the right breast in 2002.  Has done well since that time.   Tonsillectomy and adenoidectomy in 1955.  Appendectomy and tubal ligation 1977.  History of uterine fibroids, cystocele and uterine prolapse.    She is followed by cardiologist for shortness of breath and chest discomfort. Carotid Dopplers obtained in June 2022 showed no significant carotid artery disease but left subclavian artery stenosis was noted. She had a Myoview  study done in June 2022 with ejection fraction of 63%.    Dr. Francyne who saw her in November 2022 for evaluation patient was complaining of some dyspnea on exertion and precordial pain. He did not feel that she had angina and she had a normal nuclear perfusion study in June 2022. She also had an echocardiogram in June 2022 to evaluate murmur but no valvular stenosis was detected. 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 What was thought to be a carotid bruit was actually a subclavian bruit. No further workup was advised.     Labs 03/28/2024 Hemoglobin 11.1, MCHC 31.7, HgbA1c 6.0%, Otherwise WNL.   03/13/2024 Mammogram No mammographic evidence of malignancy. Repeat in one year.     01/30/2017 Colonoscopy Decreased sphincter tone found on digital rectal exam. Diverticulosis in the left colon and in the right colon. The examination was otherwise normal on direct and retroflexion views. No specimens collected. Repeat in ten years.   Vaccine counseling: UTD on Covid-19 and influenza vaccines.   Health Maintenance  Topic Date Due   Medicare Annual Wellness (AWV)  03/29/2024   OPHTHALMOLOGY EXAM  04/24/2024   HEMOGLOBIN A1C  09/25/2024   COVID-19 Vaccine (10 - Moderna risk 2025-26 season) 09/25/2024   Mammogram  03/10/2025   Diabetic kidney evaluation - eGFR measurement  03/28/2025  Diabetic kidney evaluation - Urine ACR  03/28/2025   FOOT EXAM  03/31/2025   DTaP/Tdap/Td (3 - Td or Tdap) 11/10/2031   Pneumococcal Vaccine: 50+ Years  Completed   Influenza Vaccine  Completed   DEXA SCAN  Completed   Zoster Vaccines- Shingrix  Completed   Meningococcal B Vaccine  Aged Out   Colonoscopy  Discontinued   Hepatitis C  Screening  Discontinued    Review of Systems  Constitutional:  Negative for fever and malaise/fatigue.  HENT:  Negative for congestion.   Eyes:  Negative for blurred vision.  Respiratory:  Negative for cough and shortness of breath.   Cardiovascular:  Negative for chest pain, palpitations and leg swelling.  Gastrointestinal:  Negative for vomiting.  Musculoskeletal:  Negative for back pain.  Skin:  Negative for rash.  Neurological:  Negative for loss of consciousness and headaches.   Objective:  Vitals: body mass index is 31.71 kg/m. Today's Vitals   03/31/24 1400  BP: 110/80  Pulse: 60  SpO2: 98%  Weight: 179 lb (81.2 kg)  Height: 5' 3 (1.6 m)   Physical Exam Vitals and nursing note reviewed.  Constitutional:      General: She is not in acute distress.    Appearance: Normal appearance. She is not ill-appearing or toxic-appearing.  HENT:     Head: Normocephalic and atraumatic.     Right Ear: Hearing, tympanic membrane, ear canal and external ear normal.     Left Ear: Hearing, tympanic membrane, ear canal and external ear normal.     Mouth/Throat:     Pharynx: Oropharynx is clear.  Eyes:     Extraocular Movements: Extraocular movements intact.     Pupils: Pupils are equal, round, and reactive to light.  Neck:     Thyroid : No thyroid  mass, thyromegaly or thyroid  tenderness.     Vascular: Carotid bruit present.  Cardiovascular:     Rate and Rhythm: Normal rate and regular rhythm. No extrasystoles are present.    Pulses:          Dorsalis pedis pulses are 1+ on the right side and 1+ on the left side.     Heart sounds: Normal heart sounds. No murmur heard.    No friction rub. No gallop.  Pulmonary:     Effort: Pulmonary effort is normal.     Breath sounds: Normal breath sounds. No decreased breath sounds, wheezing, rhonchi or rales.  Chest:     Chest wall: No mass.  Abdominal:     Palpations: Abdomen is soft. There is no hepatomegaly, splenomegaly or mass.      Tenderness: There is no abdominal tenderness.     Hernia: No hernia is present.  Musculoskeletal:     Cervical back: Normal range of motion.     Right lower leg: Pitting Edema present.     Left lower leg: Pitting Edema present.  Lymphadenopathy:     Cervical: No cervical adenopathy.     Upper Body:     Right upper body: No supraclavicular adenopathy.     Left upper body: No supraclavicular adenopathy.  Skin:    General: Skin is warm and dry.  Neurological:     General: No focal deficit present.     Mental Status: She is alert and oriented to person, place, and time. Mental status is at baseline.     Sensory: Sensation is intact.     Motor: Motor function is intact. No weakness.     Deep Tendon Reflexes:  Reflexes are normal and symmetric.  Psychiatric:        Attention and Perception: Attention normal.        Mood and Affect: Mood normal.        Speech: Speech normal.        Behavior: Behavior normal.        Thought Content: Thought content normal.        Cognition and Memory: Cognition normal.        Judgment: Judgment normal.     Current Outpatient Medications  Medication Instructions   amLODipine  (NORVASC ) 10 mg, Oral, Daily   aspirin 81 mg, Oral, Daily,     Blood Glucose Monitoring Suppl (ONE TOUCH ULTRA SYSTEM KIT) W/DEVICE KIT 1 kit, Does not apply,  Once, Test bid.  DX E11.9   Calcium  Carbonate (CALCIUM  600 PO) Oral, Daily,     Cholecalciferol (VITAMIN D  PO) 2,000 mg, Oral, Daily,     furosemide  (LASIX ) 40 mg, Oral, Daily PRN   glucose blood (BAYER CONTOUR NEXT TEST) test strip Use as instructed   hydrochlorothiazide  (MICROZIDE ) 12.5 mg, Oral, Daily   IRON PO Oral, Daily   Lancets (ONETOUCH ULTRASOFT) lancets Test BID.  DX E11.9   levothyroxine  (SYNTHROID ) 50 mcg, Oral, Daily   metFORMIN  (GLUCOPHAGE ) 500 mg, Oral, 2 times daily with meals   Multiple Vitamin (MULTIVITAMIN PO) Oral, Daily,     Nebivolol  HCl 20 mg, Oral, Daily   potassium chloride  SA (KLOR-CON  M) 20  MEQ tablet 20 mEq, Oral, 2 times daily   rosuvastatin  (CRESTOR ) 10 mg, Oral, Daily   Past Medical History:  Diagnosis Date   Breast cancer (HCC)    Cancer (HCC) [id number]   Stage 2 breast cancer bil   Diabetes mellitus without complication (HCC)    Family history of breast cancer    Family history of stomach cancer    Hyperlipidemia    Hypertension    Personal history of radiation therapy 2002,2011   Thyroid  disease    hypothyroidism   Wears dentures    partial   Wears glasses    Medical/Surgical History Narrative:  Allergic/Intolerant to:  Allergies  Allergen Reactions   Irbesartan Cough and Other (See Comments)    Other Reaction: CONGESTION    Past Surgical History:  Procedure Laterality Date   APPENDECTOMY     BREAST LUMPECTOMY Right 2002   BREAST LUMPECTOMY Left 03/07/2010   BREAST SURGERY     left lumpectomy sentinel  node biopsy   BREAST SURGERY     right lumpectomy   COLONOSCOPY  2008   at [facility], normal exam   TUBAL LIGATION     Family History  Problem Relation Age of Onset   Diabetes Mother    Heart disease Mother    Iron deficiency Mother    Diabetes Father    Iron deficiency Father    Stomach cancer Father 52   Diabetes Sister    Hyperlipidemia Sister    Iron deficiency Sister    Kidney disease Sister    Breast cancer Sister 48       DCIS   Iron deficiency Sister    Hepatitis C Brother    Cancer Brother        hepatocellular   Lung cancer Brother    Breast cancer Maternal Aunt 53   Breast cancer Cousin    Colon cancer Neg Hx    Family History Narrative: Father died at age 76 of a stroke.  Mother died from coronary disease.  1 brother with history of hypertension and diabetes.  1 sister with hypertension and diabetes.  Another sister with hypertension.  1 sister in good health without medical issues.   Social history: She lives in [place]. Does not smoke or consume alcohol. 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 Has daughter, Tobias Clause, who resides in [place] and is supportive.   Most Recent Health Risks Assessment:   Most Recent Social Determinants of Health (Including Hx of Tobacco, Alcohol, and Drug Use) SDOH Screenings   Food Insecurity: No Food Insecurity (03/31/2024)  Housing: Low Risk  (03/31/2024)  Transportation Needs: No Transportation Needs (03/31/2024)  Utilities: Not At Risk (03/31/2024)  Alcohol Screen: Low Risk  (03/30/2023)  Depression (PHQ2-9): Low Risk  (03/30/2023)  Financial Resource Strain: Low Risk  (03/30/2023)  Physical Activity: Inactive (03/30/2023)  Social Connections: Moderately Integrated (03/31/2024)  Stress: No Stress Concern Present (03/30/2023)  Tobacco Use: Low Risk  (03/31/2024)  Health Literacy: Adequate Health Literacy (03/30/2023)   Social History   Tobacco Use   Smoking status: Never   Smokeless tobacco: Never  Vaping Use   Vaping status: Never Used  Substance Use Topics   Alcohol use: No   Drug use: No    Most Recent Fall Risk Assessment:    03/31/2024    1:57 PM  Fall Risk   Falls in the past year? 0  Number falls in past yr: 0  Injury with Fall? 0  Risk for fall due to : No Fall Risks  Follow up Falls prevention discussed;Falls evaluation completed   Most Recent Anxiety/Depression Screenings:    03/30/2023    9:55 AM 03/28/2022   11:08 AM  PHQ 2/9 Scores  PHQ - 2 Score 0 0    Most Recent Cognitive Screening:    03/31/2024    1:57 PM  6CIT Screen  What Year? 0 points  What month? 0 points  What time? 0 points  Count back from 20 0 points  Months in reverse 0 points  Repeat phrase 0 points  Total Score 0 points    Results:  Studies Obtained And Personally Reviewed By Me: Diabetic Foot Exam - Simple   Simple Foot Form Visual Inspection No deformities, no ulcerations, no other skin breakdown bilaterally: Yes Sensation Testing Intact to touch and  monofilament testing bilaterally: Yes Pulse Check Posterior Tibialis and Dorsalis pulse intact bilaterally: Yes Comments     03/10/2024 Mammogram No mammographic evidence of malignancy. Repeat in one year.     01/30/2017 Colonoscopy Decreased sphincter tone found on digital rectal exam. Diverticulosis in the left colon and in the right colon. The examination was otherwise normal on direct and retroflexion views. No specimens collected. Repeat in ten years.   Labs:  CBC w/ Differential Lab Results  Component Value Date   WBC 8.8 03/28/2024   RBC 3.94 03/28/2024   HGB 11.1 (L) 03/28/2024   HCT 35.0 03/28/2024   PLT 255 03/28/2024   MCV 88.8 03/28/2024   MCH 28.2 03/28/2024   MCHC 31.7 (L) 03/28/2024   RDW 14.0 03/28/2024   MPV 12.5 03/28/2024   LYMPHSABS 1,591 03/24/2022   MONOABS 0.6 06/06/2018   BASOSABS 62 03/28/2024    Comprehensive Metabolic Panel Lab Results  Component Value Date   NA 141 03/28/2024   K 4.5 03/28/2024   CL 103 03/28/2024   CO2 28 03/28/2024   GLUCOSE 89 03/28/2024   BUN 20  03/28/2024   CREATININE 0.81 03/28/2024   CALCIUM  9.7 03/28/2024   PROT 7.2 03/28/2024   ALBUMIN 3.6 05/17/2018   AST 21 03/28/2024   ALT 14 03/28/2024   ALKPHOS 111 05/17/2018   BILITOT 0.3 03/28/2024   EGFR 75 03/28/2024   GFRNONAA 64 02/24/2020   Lipid Panel  Lab Results  Component Value Date   CHOL 148 03/28/2024   HDL 88 03/28/2024   LDLCALC 48 03/28/2024   TRIG 50 03/28/2024   A1c Lab Results  Component Value Date   HGBA1C 6.0 (H) 03/28/2024    TSH Lab Results  Component Value Date   TSH 1.89 03/28/2024    Results for orders placed or performed in visit on 03/31/24  POCT URINALYSIS DIP (CLINITEK)  Result Value Ref Range   Color, UA yellow yellow   Clarity, UA clear clear   Glucose, UA negative negative mg/dL   Bilirubin, UA negative negative   Ketones, POC UA negative negative mg/dL   Spec Grav, UA 8.984 8.989 - 1.025   Blood, UA negative  negative   pH, UA 6.5 5.0 - 8.0   POC PROTEIN,UA negative negative, trace   Urobilinogen, UA 0.2 0.2 or 1.0 E.U./dL   Nitrite, UA Positive (A) Negative   Leukocytes, UA Small (1+) (A) Negative   Assessment & Plan:   Orders Placed This Encounter  Procedures   Urine Culture   US  Carotid Duplex Left    Reason for Exam (SYMPTOM  OR DIAGNOSIS REQUIRED):   left carotid artery bruit    Preferred imaging location?:   [place]   POCT URINALYSIS DIP (CLINITEK)   Meds ordered this encounter  Medications   hydrochlorothiazide  (MICROZIDE ) 12.5 MG capsule    Sig: Take 1 capsule (12.5 mg total) by mouth daily.    Dispense:  90 capsule    Refill:  1   rosuvastatin  (CRESTOR ) 10 MG tablet    Sig: Take 1 tablet (10 mg total) by mouth daily.    Dispense:  90 tablet    Refill:  1   levothyroxine  (SYNTHROID ) 50 MCG tablet    Sig: Take 1 tablet (50 mcg total) by mouth daily.    Dispense:  90 tablet    Refill:  1    Left carotid bruit heard on physical exam.    Carotid ultrasound ordered.   Diabetes mellitus, Type II: treated with Metformin  500 mg twice daily with meals. 03/29/2023 HgbA1c 6.0%.   Metformin  refilled.    Hyperlipidemia: treated with Rosuvastatin  10 mg daily in the evening. Takes Klor-con  20 meq twice daily. 03/28/2024 Lipid Panel normal.    Rosuvastatin  refilled.    Hypertension: treated with Nebivolol  20 mg daily, Hydrochlorothiazide  12.5 mg daily, Amlodipine  10 mg daily. Blood pressure normal at 110/80.   Hydrochlorothiazide  refilled.    Lower extremity edema: treated with Furosemide  40 mg by mouth daily as needed.   Hypothyroidism: treated with Levothyroxine  50 mcg daily. 03/28/2024 TSH 1.89.    Levothyroxine  refilled at current dose.   Abnormal urine dipstick- urine culture ordered. Add: has E.coli UTI and will be treated with Levaquin . She is asymptomatic. She was contacted by phone after the visit with this result 03/13/2024 Mammogram No mammographic evidence of  malignancy. Repeat in one year.     01/30/2017 Colonoscopy Decreased sphincter tone found on digital rectal exam. Diverticulosis in the left colon and in the right colon. The examination was otherwise normal on direct and retroflexion views. No specimens collected. Repeat in ten years.  Vaccine counseling: UTD on Covid-19 and influenza vaccines.     Annual Wellness Visit done today including the all of the following: Reviewed patient's Family Medical History Reviewed patient's SDOH and reviewed tobacco, alcohol, and drug use.  Reviewed and updated list of patient's medical providers Assessment of cognitive impairment was done Assessed patient's functional ability Established a written schedule for health screening services Health Risk Assessent Completed and Reviewed  Discussed health benefits of physical activity, and encouraged her to engage in regular exercise appropriate for her age and condition.    I,Makayla C Reid,acting as a scribe for Ronal JINNY Hailstone, MD.,have documented all relevant documentation on the behalf of Ronal JINNY Hailstone, MD,as directed by  Ronal JINNY Hailstone, MD while in the presence of Ronal JINNY Hailstone, MD.  I, Ronal JINNY Hailstone, MD, have reviewed all documentation for and agree with the above Annual Wellness Visit documentation.  Ronal JINNY Hailstone, MD Internal Medicine 03/31/2024

## 2024-03-28 ENCOUNTER — Other Ambulatory Visit: Payer: Self-pay

## 2024-03-28 DIAGNOSIS — I10 Essential (primary) hypertension: Secondary | ICD-10-CM

## 2024-03-28 DIAGNOSIS — Z0000 Encounter for general adult medical examination without abnormal findings: Secondary | ICD-10-CM

## 2024-03-28 DIAGNOSIS — E119 Type 2 diabetes mellitus without complications: Secondary | ICD-10-CM

## 2024-03-28 DIAGNOSIS — E039 Hypothyroidism, unspecified: Secondary | ICD-10-CM

## 2024-03-28 DIAGNOSIS — E1169 Type 2 diabetes mellitus with other specified complication: Secondary | ICD-10-CM

## 2024-03-29 ENCOUNTER — Other Ambulatory Visit: Payer: Self-pay | Admitting: Internal Medicine

## 2024-03-29 LAB — COMPREHENSIVE METABOLIC PANEL WITH GFR
AG Ratio: 1.7 (calc) (ref 1.0–2.5)
ALT: 14 U/L (ref 6–29)
AST: 21 U/L (ref 10–35)
Albumin: 4.5 g/dL (ref 3.6–5.1)
Alkaline phosphatase (APISO): 73 U/L (ref 37–153)
BUN: 20 mg/dL (ref 7–25)
CO2: 28 mmol/L (ref 20–32)
Calcium: 9.7 mg/dL (ref 8.6–10.4)
Chloride: 103 mmol/L (ref 98–110)
Creat: 0.81 mg/dL (ref 0.60–1.00)
Globulin: 2.7 g/dL (ref 1.9–3.7)
Glucose, Bld: 89 mg/dL (ref 65–99)
Potassium: 4.5 mmol/L (ref 3.5–5.3)
Sodium: 141 mmol/L (ref 135–146)
Total Bilirubin: 0.3 mg/dL (ref 0.2–1.2)
Total Protein: 7.2 g/dL (ref 6.1–8.1)
eGFR: 75 mL/min/1.73m2 (ref >=60)

## 2024-03-29 LAB — CBC WITH DIFFERENTIAL/PLATELET
Absolute Lymphocytes: 1654 {cells}/uL (ref 850–3900)
Absolute Monocytes: 660 {cells}/uL (ref 200–950)
Basophils Absolute: 62 {cells}/uL (ref 0–200)
Basophils Relative: 0.7 %
Eosinophils Absolute: 264 {cells}/uL (ref 15–500)
Eosinophils Relative: 3 %
HCT: 35 % (ref 35.0–45.0)
Hemoglobin: 11.1 g/dL — ABNORMAL LOW (ref 11.7–15.5)
MCH: 28.2 pg (ref 27.0–33.0)
MCHC: 31.7 g/dL — ABNORMAL LOW (ref 32.0–36.0)
MCV: 88.8 fL (ref 80.0–100.0)
MPV: 12.5 fL (ref 7.5–12.5)
Monocytes Relative: 7.5 %
Neutro Abs: 6160 {cells}/uL (ref 1500–7800)
Neutrophils Relative %: 70 %
Platelets: 255 Thousand/uL (ref 140–400)
RBC: 3.94 Million/uL (ref 3.80–5.10)
RDW: 14 % (ref 11.0–15.0)
Total Lymphocyte: 18.8 %
WBC: 8.8 Thousand/uL (ref 3.8–10.8)

## 2024-03-29 LAB — HEMOGLOBIN A1C
Hgb A1c MFr Bld: 6 % — ABNORMAL HIGH (ref <5.7)
Mean Plasma Glucose: 126 mg/dL
eAG (mmol/L): 7 mmol/L

## 2024-03-29 LAB — LIPID PANEL
Cholesterol: 148 mg/dL (ref <200)
HDL: 88 mg/dL (ref >=50)
LDL Cholesterol (Calc): 48 mg/dL
Non-HDL Cholesterol (Calc): 60 mg/dL (ref <130)
Total CHOL/HDL Ratio: 1.7 (calc) (ref <5.0)
Triglycerides: 50 mg/dL (ref <150)

## 2024-03-29 LAB — MICROALBUMIN / CREATININE URINE RATIO
Creatinine, Urine: 108 mg/dL (ref 20–275)
Microalb Creat Ratio: 8 mg/g{creat} (ref <30)
Microalb, Ur: 0.9 mg/dL

## 2024-03-29 LAB — TSH: TSH: 1.89 m[IU]/L (ref 0.40–4.50)

## 2024-03-31 ENCOUNTER — Encounter: Payer: Self-pay | Admitting: Internal Medicine

## 2024-03-31 ENCOUNTER — Ambulatory Visit: Payer: Self-pay | Admitting: Internal Medicine

## 2024-03-31 ENCOUNTER — Ambulatory Visit (INDEPENDENT_AMBULATORY_CARE_PROVIDER_SITE_OTHER): Payer: Medicare Other | Admitting: Internal Medicine

## 2024-03-31 VITALS — BP 110/80 | HR 60 | Ht 63.0 in | Wt 179.0 lb

## 2024-03-31 DIAGNOSIS — B9620 Unspecified Escherichia coli [E. coli] as the cause of diseases classified elsewhere: Secondary | ICD-10-CM

## 2024-03-31 DIAGNOSIS — I10 Essential (primary) hypertension: Secondary | ICD-10-CM | POA: Diagnosis not present

## 2024-03-31 DIAGNOSIS — Z853 Personal history of malignant neoplasm of breast: Secondary | ICD-10-CM

## 2024-03-31 DIAGNOSIS — E119 Type 2 diabetes mellitus without complications: Secondary | ICD-10-CM | POA: Diagnosis not present

## 2024-03-31 DIAGNOSIS — N3000 Acute cystitis without hematuria: Secondary | ICD-10-CM

## 2024-03-31 DIAGNOSIS — R82998 Other abnormal findings in urine: Secondary | ICD-10-CM

## 2024-03-31 DIAGNOSIS — E1169 Type 2 diabetes mellitus with other specified complication: Secondary | ICD-10-CM

## 2024-03-31 DIAGNOSIS — Z6831 Body mass index (BMI) 31.0-31.9, adult: Secondary | ICD-10-CM

## 2024-03-31 DIAGNOSIS — E785 Hyperlipidemia, unspecified: Secondary | ICD-10-CM | POA: Diagnosis not present

## 2024-03-31 DIAGNOSIS — Z0000 Encounter for general adult medical examination without abnormal findings: Secondary | ICD-10-CM

## 2024-03-31 DIAGNOSIS — R0989 Other specified symptoms and signs involving the circulatory and respiratory systems: Secondary | ICD-10-CM

## 2024-03-31 DIAGNOSIS — E039 Hypothyroidism, unspecified: Secondary | ICD-10-CM

## 2024-03-31 DIAGNOSIS — R609 Edema, unspecified: Secondary | ICD-10-CM

## 2024-03-31 DIAGNOSIS — Z7984 Long term (current) use of oral hypoglycemic drugs: Secondary | ICD-10-CM | POA: Diagnosis not present

## 2024-03-31 DIAGNOSIS — R600 Localized edema: Secondary | ICD-10-CM

## 2024-03-31 LAB — POCT URINALYSIS DIP (CLINITEK)
Bilirubin, UA: NEGATIVE
Blood, UA: NEGATIVE
Glucose, UA: NEGATIVE mg/dL
Ketones, POC UA: NEGATIVE mg/dL
Nitrite, UA: POSITIVE — AB
POC PROTEIN,UA: NEGATIVE
Spec Grav, UA: 1.015 (ref 1.010–1.025)
Urobilinogen, UA: 0.2 U/dL
pH, UA: 6.5 (ref 5.0–8.0)

## 2024-03-31 MED ORDER — LEVOTHYROXINE SODIUM 50 MCG PO TABS: 50.0000 ug | ORAL_TABLET | Freq: Every day | ORAL | 1 refills | Status: DC

## 2024-03-31 MED ORDER — HYDROCHLOROTHIAZIDE 12.5 MG PO CAPS: 12.5000 mg | ORAL_CAPSULE | Freq: Every day | ORAL | 1 refills | Status: AC

## 2024-03-31 MED ORDER — ROSUVASTATIN CALCIUM 10 MG PO TABS
10.0000 mg | ORAL_TABLET | Freq: Every day | ORAL | 1 refills | Status: AC
Start: 2024-03-31 — End: ?

## 2024-04-01 NOTE — Progress Notes (Addendum)
 Chief Complaint  Patient presents with   Annual Exam   Medicare Wellness     Subjective:   Victoria Hess is a 78 y.o. female who presents for a Medicare Annual Wellness Visit.  Allergies (verified) Irbesartan   History: Past Medical History:  Diagnosis Date   Breast cancer (HCC)    Cancer (HCC) [id number]   Stage 2 breast cancer bil   Diabetes mellitus without complication (HCC)    Family history of breast cancer    Family history of stomach cancer    Hyperlipidemia    Hypertension    Personal history of radiation therapy 2002,2011   Thyroid  disease    hypothyroidism   Wears dentures    partial   Wears glasses    Past Surgical History:  Procedure Laterality Date   APPENDECTOMY     BREAST LUMPECTOMY Right 2002   BREAST LUMPECTOMY Left 03/07/2010   BREAST SURGERY     left lumpectomy sentinel  node biopsy   BREAST SURGERY     right lumpectomy   COLONOSCOPY  2008   at [facility], normal exam   TUBAL LIGATION     Family History  Problem Relation Age of Onset   Diabetes Mother    Heart disease Mother    Iron deficiency Mother    Diabetes Father    Iron deficiency Father    Stomach cancer Father 19   Diabetes Sister    Hyperlipidemia Sister    Iron deficiency Sister    Kidney disease Sister    Breast cancer Sister 86       DCIS   Iron deficiency Sister    Hepatitis C Brother    Cancer Brother        hepatocellular   Lung cancer Brother    Breast cancer Maternal Aunt 3   Breast cancer Cousin    Colon cancer Neg Hx    Social History   Occupational History   Not on file  Tobacco Use   Smoking status: Never   Smokeless tobacco: Never  Vaping Use   Vaping status: Never Used  Substance and Sexual Activity   Alcohol use: No   Drug use: No   Sexual activity: Not Currently    Birth control/protection: Post-menopausal   Tobacco Counseling Counseling given: Not Answered  SDOH Screenings   Food Insecurity: No Food Insecurity (03/31/2024)   Housing: Low Risk  (03/31/2024)  Transportation Needs: No Transportation Needs (03/31/2024)  Utilities: Not At Risk (03/31/2024)  Alcohol Screen: Low Risk  (03/30/2023)  Depression (PHQ2-9): Low Risk  (03/30/2023)  Financial Resource Strain: Low Risk  (03/30/2023)  Physical Activity: Inactive (03/30/2023)  Social Connections: Moderately Integrated (03/31/2024)  Stress: No Stress Concern Present (03/30/2023)  Tobacco Use: Low Risk  (03/31/2024)  Health Literacy: Adequate Health Literacy (03/30/2023)   See flowsheets for full screening details  Depression Screen     Goals Addressed   None    Visit info / Clinical Intake: Medicare Wellness Visit Type:: Subsequent Annual Wellness Visit Persons participating in visit:: patient Medicare Wellness Visit Mode:: In-person (required for WTM) Information given by:: patient Interpreter Needed?: No Pre-visit prep was completed: yes AWV questionnaire completed by patient prior to visit?: no Living arrangements:: (!) lives alone Patient's Overall Health Status Rating: excellent Typical amount of pain: none Does pain affect daily life?: no Are you currently prescribed opioids?: no  Dietary Habits and Nutritional Risks How many meals a day?: 3 Eats fruit and vegetables daily?: yes Most meals are  obtained by: preparing own meals; eating out In the last 2 weeks, have you had any of the following?: none Diabetic:: (!) yes Any non-healing wounds?: no How often do you check your BS?: 1 Would you like to be referred to a Nutritionist or for Diabetic Management? : no  Functional Status Activities of Daily Living (to include ambulation/medication): Independent Ambulation: Independent Medication Administration: Independent Home Management: Independent Manage your own finances?: (!) no Primary transportation is: driving Concerns about vision?: no *vision screening is required for WTM* Concerns about hearing?: no  Fall Screening Falls in  the past year?: 0 Number of falls in past year: 0 Was there an injury with Fall?: 0 Fall Risk Category Calculator: 0 Patient Fall Risk Level: Low Fall Risk  Fall Risk Patient at Risk for Falls Due to: No Fall Risks Fall risk Follow up: Falls prevention discussed; Falls evaluation completed  Home and Transportation Safety: All rugs have non-skid backing?: yes All stairs or steps have railings?: (!) no Grab bars in the bathtub or shower?: (!) no Have non-skid surface in bathtub or shower?: yes Good home lighting?: yes Regular seat belt use?: yes Hospital stays in the last year:: no  Cognitive Assessment Difficulty concentrating, remembering, or making decisions? : no Will 6CIT or Mini Cog be Completed: yes What year is it?: 0 points What month is it?: 0 points About what time is it?: 0 points Count backwards from 20 to 1: 0 points Say the months of the year in reverse: 0 points Repeat the address phrase from earlier: 0 points 6 CIT Score: 0 points  Advance Directives (For Healthcare) Does Patient Have a Medical Advance Directive?: Yes Type of Advance Directive: Living will; Healthcare Power of Attorney Copy of Healthcare Power of Attorney in Chart?: No - copy requested Copy of Living Will in Chart?: No - copy requested  Reviewed/Updated  Reviewed/Updated: Reviewed All (Medical, Surgical, Family, Medications, Allergies, Care Teams, Patient Goals)        Objective:    Today's Vitals   03/31/24 1400  BP: 110/80  Pulse: 60  SpO2: 98%  Weight: 179 lb (81.2 kg)  Height: 5' 3 (1.6 m)   Body mass index is 31.71 kg/m.  Current Medications (verified) Outpatient Encounter Medications as of 03/31/2024  Medication Sig   hydrochlorothiazide  (MICROZIDE ) 12.5 MG capsule Take 1 capsule (12.5 mg total) by mouth daily.   levothyroxine  (SYNTHROID ) 50 MCG tablet Take 1 tablet (50 mcg total) by mouth daily.   rosuvastatin  (CRESTOR ) 10 MG tablet Take 1 tablet (10 mg total) by  mouth daily.   amLODipine  (NORVASC ) 10 MG tablet TAKE 1 TABLET BY MOUTH DAILY   aspirin 81 MG tablet Take 81 mg by mouth daily.   Blood Glucose Monitoring Suppl (ONE TOUCH ULTRA SYSTEM KIT) W/DEVICE KIT 1 kit by Does not apply route once. Test bid.  DX E11.9   Calcium  Carbonate (CALCIUM  600 PO) Take by mouth daily.   Cholecalciferol (VITAMIN D  PO) Take 2,000 mg by mouth daily.   furosemide  (LASIX ) 40 MG tablet Take 1 tablet (40 mg total) by mouth daily as needed for edema.   glucose blood (BAYER CONTOUR NEXT TEST) test strip Use as instructed   IRON PO Take by mouth daily.   Lancets (ONETOUCH ULTRASOFT) lancets Test BID.  DX E11.9   metFORMIN  (GLUCOPHAGE ) 500 MG tablet TAKE 1 TABLET BY MOUTH TWICE  DAILY WITH MEALS   Multiple Vitamin (MULTIVITAMIN PO) Take by mouth daily.   Nebivolol  HCl 20 MG  TABS TAKE 1 TABLET BY MOUTH DAILY   potassium chloride  SA (KLOR-CON  M) 20 MEQ tablet TAKE 1 TABLET BY MOUTH TWICE  DAILY   [DISCONTINUED] amLODipine  (NORVASC ) 10 MG tablet TAKE 1 TABLET BY MOUTH DAILY   [DISCONTINUED] hydrochlorothiazide  (MICROZIDE ) 12.5 MG capsule TAKE 1 CAPSULE BY MOUTH ONCE DAILY   [DISCONTINUED] levothyroxine  (SYNTHROID ) 50 MCG tablet TAKE 1 TABLET BY MOUTH ONCE DAILY ON AN EMPTY STOMACH. WAIT 30 MINUTES BEFORE TAKING OTHER MEDS.   [DISCONTINUED] rosuvastatin  (CRESTOR ) 10 MG tablet Take 1 tablet (10 mg total) by mouth every evening.   No facility-administered encounter medications on file as of 03/31/2024.   Hearing/Vision screen No results found. Immunizations and Health Maintenance Health Maintenance  Topic Date Due   Medicare Annual Wellness (AWV)  03/29/2024   OPHTHALMOLOGY EXAM  04/24/2024   HEMOGLOBIN A1C  09/25/2024   COVID-19 Vaccine (10 - Moderna risk 2025-26 season) 09/25/2024   Mammogram  03/10/2025   Diabetic kidney evaluation - eGFR measurement  03/28/2025   Diabetic kidney evaluation - Urine ACR  03/28/2025   FOOT EXAM  03/31/2025   DTaP/Tdap/Td (3 - Td or  Tdap) 11/10/2031   Pneumococcal Vaccine: 50+ Years  Completed   Influenza Vaccine  Completed   DEXA SCAN  Completed   Zoster Vaccines- Shingrix  Completed   Meningococcal B Vaccine  Aged Out   Colonoscopy  Discontinued   Hepatitis C Screening  Discontinued        Assessment/Plan:  This is a routine wellness examination for Victoria Hess.  Patient Care Team: Perri Ronal PARAS, MD as PCP - General (Internal Medicine) Croitoru, Jerel, MD as PCP - Cardiology (Cardiology)  I have personally reviewed and noted the following in the patient's chart:   Medical and social history Use of alcohol, tobacco or illicit drugs  Current medications and supplements including opioid prescriptions. Functional ability and status Nutritional status Physical activity Advanced directives List of other physicians Hospitalizations, surgeries, and ER visits in previous 12 months Vitals Screenings to include cognitive, depression, and falls Referrals and appointments  Orders Placed This Encounter  Procedures   Urine Culture   US  Carotid Duplex Left    Reason for Exam (SYMPTOM  OR DIAGNOSIS REQUIRED):   left carotid artery bruit    Preferred imaging location?:   [facility]   POCT URINALYSIS DIP (CLINITEK)   In addition, I have reviewed and discussed with patient certain preventive protocols, quality metrics, and best practice recommendations. A written personalized care plan for preventive services as well as general preventive health recommendations were provided to patient.   Victoria Hess, CMA   03/31/2024   No follow-ups on file.  After Visit Summary: (In Person-Printed) AVS printed and given to the patient   I, Ronal PARAS Perri, MD, have reviewed all documentation for this visit. The documentation on 03/31/2024 for the exam, diagnosis, procedures, and orders are all accurate and complete.

## 2024-04-01 NOTE — Patient Instructions (Addendum)
 Victoria Hess,  Thank you for taking the time for your Medicare Wellness Visit. I appreciate your continued commitment to your health goals. Please review the care plan we discussed, and feel free to reach out if I can assist you further.  Please note that Annual Wellness Visits do not include a physical exam. Some assessments may be limited, especially if the visit was conducted virtually. If needed, we may recommend an in-person follow-up with your provider.  Ongoing Care Seeing your primary care provider every 3 to 6 months helps us  monitor your health and provide consistent, personalized care.   Referrals If a referral was made during today's visit and you haven't received any updates within two weeks, please contact the referred provider directly to check on the status.  Please have urine rechecked in [place] after you finish antibiotic treatment.Best wishes always. We will miss seeing you.  Recommended Screenings:  Health Maintenance  Topic Date Due   Medicare Annual Wellness Visit  03/29/2024   Eye exam for diabetics  04/24/2024   Hemoglobin A1C  09/25/2024   COVID-19 Vaccine (10 - Moderna risk 2025-26 season) 09/25/2024   Breast Cancer Screening  03/10/2025   Yearly kidney function blood test for diabetes  03/28/2025   Yearly kidney health urinalysis for diabetes  03/28/2025   Complete foot exam   03/31/2025   DTaP/Tdap/Td vaccine (3 - Td or Tdap) 11/10/2031   Pneumococcal Vaccine for age over 2  Completed   Flu Shot  Completed   DEXA scan (bone density measurement)  Completed   Zoster (Shingles) Vaccine  Completed   Meningitis B Vaccine  Aged Out   Colon Cancer Screening  Discontinued   Hepatitis C Screening  Discontinued       03/31/2024    1:57 PM  Advanced Directives  Does Patient Have a Medical Advance Directive? Yes  Type of Advance Directive Living will;Healthcare Power of Attorney  Copy of Healthcare Power of Attorney in Chart? No - copy requested     Vision: Annual vision screenings are recommended for early detection of glaucoma, cataracts, and diabetic retinopathy. These exams can also reveal signs of chronic conditions such as diabetes and high blood pressure.  Dental: Annual dental screenings help detect early signs of oral cancer, gum disease, and other conditions linked to overall health, including heart disease and diabetes.  Please see the attached documents for additional preventive care recommendations.     Next appointment: Follow up in one year for your annual wellness visit    Preventive Care 65 Years and Older, Female Preventive care refers to lifestyle choices and visits with your health care provider that can promote health and wellness. What does preventive care include? A yearly physical exam. This is also called an annual well check. Dental exams once or twice a year. Routine eye exams. Ask your health care provider how often you should have your eyes checked. Personal lifestyle choices, including: Daily care of your teeth and gums. Regular physical activity. Eating a healthy diet. Avoiding tobacco and drug use. Limiting alcohol use. Practicing safe sex. Taking low-dose aspirin every day. Taking vitamin and mineral supplements as recommended by your health care provider. What happens during an annual well check? The services and screenings done by your health care provider during your annual well check will depend on your age, overall health, lifestyle risk factors, and family history of disease. Counseling  Your health care provider may ask you questions about your: Alcohol use. Tobacco use. Drug use. Emotional  well-being. Home and relationship well-being. Sexual activity. Eating habits. History of falls. Memory and ability to understand (cognition). Work and work astronomer. Reproductive health. Screening  You may have the following tests or measurements: Height, weight, and BMI. Blood  pressure. Lipid and cholesterol levels. These may be checked every 5 years, or more frequently if you are over 59 years old. Skin check. Lung cancer screening. You may have this screening every year starting at age 34 if you have a 30-pack-year history of smoking and currently smoke or have quit within the past 15 years. Fecal occult blood test (FOBT) of the stool. You may have this test every year starting at age 72. Flexible sigmoidoscopy or colonoscopy. You may have a sigmoidoscopy every 5 years or a colonoscopy every 10 years starting at age 74. Hepatitis C blood test. Hepatitis B blood test. Sexually transmitted disease (STD) testing. Diabetes screening. This is done by checking your blood sugar (glucose) after you have not eaten for a while (fasting). You may have this done every 1-3 years. Bone density scan. This is done to screen for osteoporosis. You may have this done starting at age 30. Mammogram. This may be done every 1-2 years. Talk to your health care provider about how often you should have regular mammograms. Talk with your health care provider about your test results, treatment options, and if necessary, the need for more tests. Vaccines  Your health care provider may recommend certain vaccines, such as: Influenza vaccine. This is recommended every year. Tetanus, diphtheria, and acellular pertussis (Tdap, Td) vaccine. You may need a Td booster every 10 years. Zoster vaccine. You may need this after age 72. Pneumococcal 13-valent conjugate (PCV13) vaccine. One dose is recommended after age 15. Pneumococcal polysaccharide (PPSV23) vaccine. One dose is recommended after age 44. Talk to your health care provider about which screenings and vaccines you need and how often you need them. This information is not intended to replace advice given to you by your health care provider. Make sure you discuss any questions you have with your health care provider. Document Released:  05/28/2015 Document Revised: 01/19/2016 Document Reviewed: 03/02/2015 Elsevier Interactive Patient Education  2017 [employer].  Fall Prevention in the Home Falls can cause injuries. They can happen to people of all ages. There are many things you can do to make your home safe and to help prevent falls. What can I do on the outside of my home? Regularly fix the edges of walkways and driveways and fix any cracks. Remove anything that might make you trip as you walk through a door, such as a raised step or threshold. Trim any bushes or trees on the path to your home. Use bright outdoor lighting. Clear any walking paths of anything that might make someone trip, such as rocks or tools. Regularly check to see if handrails are loose or broken. Make sure that both sides of any steps have handrails. Any raised decks and porches should have guardrails on the edges. Have any leaves, snow, or ice cleared regularly. Use sand or salt on walking paths during winter. Clean up any spills in your garage right away. This includes oil or grease spills. What can I do in the bathroom? Use night lights. Install grab bars by the toilet and in the tub and shower. Do not use towel bars as grab bars. Use non-skid mats or decals in the tub or shower. If you need to sit down in the shower, use a plastic, non-slip stool. Keep  the floor dry. Clean up any water that spills on the floor as soon as it happens. Remove soap buildup in the tub or shower regularly. Attach bath mats securely with double-sided non-slip rug tape. Do not have throw rugs and other things on the floor that can make you trip. What can I do in the bedroom? Use night lights. Make sure that you have a light by your bed that is easy to reach. Do not use any sheets or blankets that are too big for your bed. They should not hang down onto the floor. Have a firm chair that has side arms. You can use this for support while you get dressed. Do not have  throw rugs and other things on the floor that can make you trip. What can I do in the kitchen? Clean up any spills right away. Avoid walking on wet floors. Keep items that you use a lot in easy-to-reach places. If you need to reach something above you, use a strong step stool that has a grab bar. Keep electrical cords out of the way. Do not use floor polish or wax that makes floors slippery. If you must use wax, use non-skid floor wax. Do not have throw rugs and other things on the floor that can make you trip. What can I do with my stairs? Do not leave any items on the stairs. Make sure that there are handrails on both sides of the stairs and use them. Fix handrails that are broken or loose. Make sure that handrails are as long as the stairways. Check any carpeting to make sure that it is firmly attached to the stairs. Fix any carpet that is loose or worn. Avoid having throw rugs at the top or bottom of the stairs. If you do have throw rugs, attach them to the floor with carpet tape. Make sure that you have a light switch at the top of the stairs and the bottom of the stairs. If you do not have them, ask someone to add them for you. What else can I do to help prevent falls? Wear shoes that: Do not have high heels. Have rubber bottoms. Are comfortable and fit you well. Are closed at the toe. Do not wear sandals. If you use a stepladder: Make sure that it is fully opened. Do not climb a closed stepladder. Make sure that both sides of the stepladder are locked into place. Ask someone to hold it for you, if possible. Clearly mark and make sure that you can see: Any grab bars or handrails. First and last steps. Where the edge of each step is. Use tools that help you move around (mobility aids) if they are needed. These include: Canes. Walkers. Scooters. Crutches. Turn on the lights when you go into a dark area. Replace any light bulbs as soon as they burn out. Set up your furniture so  you have a clear path. Avoid moving your furniture around. If any of your floors are uneven, fix them. If there are any pets around you, be aware of where they are. Review your medicines with your doctor. Some medicines can make you feel dizzy. This can increase your chance of falling. Ask your doctor what other things that you can do to help prevent falls. This information is not intended to replace advice given to you by your health care provider. Make sure you discuss any questions you have with your health care provider. Document Released: 02/25/2009 Document Revised: 10/07/2015 Document Reviewed: 06/05/2014 Elsevier  Interactive Patient Education  2017 [employer].  Continue to watch diet and exercise. A left carotid bruit was heard today and a carotid study has been ordered. Continue current medications. Continue diet and exercise efforts.It has been a pleasure to see you and be your primary care physician. Urine culture pending.

## 2024-04-03 ENCOUNTER — Telehealth: Payer: Self-pay | Admitting: Internal Medicine

## 2024-04-03 ENCOUNTER — Encounter: Payer: Self-pay | Admitting: Internal Medicine

## 2024-04-03 DIAGNOSIS — B9620 Unspecified Escherichia coli [E. coli] as the cause of diseases classified elsewhere: Secondary | ICD-10-CM

## 2024-04-03 LAB — URINE CULTURE
MICRO NUMBER:: 17244199
SPECIMEN QUALITY:: ADEQUATE

## 2024-04-03 MED ORDER — LEVOFLOXACIN 500 MG PO TABS: 500.0000 mg | ORAL_TABLET | Freq: Every day | ORAL | 0 refills | Status: AC

## 2024-04-03 NOTE — Telephone Encounter (Signed)
 She has an E.coli UTI based on urine culture from recent visit. Contacted patient regarding results. She is asymptomatic. Sending in Levaquin 500 mg daily for 7 days to [facility]. MJB, MD

## 2024-04-11 ENCOUNTER — Encounter: Payer: Self-pay | Admitting: Internal Medicine

## 2024-04-19 ENCOUNTER — Other Ambulatory Visit: Payer: Self-pay | Admitting: Internal Medicine

## 2024-05-02 ENCOUNTER — Ambulatory Visit (HOSPITAL_COMMUNITY)
Admission: RE | Admit: 2024-05-02 | Discharge: 2024-05-02 | Disposition: A | Discharge status: Home / Self Care | Source: Ambulatory Visit | Attending: Internal Medicine | Admitting: Internal Medicine

## 2024-05-02 DIAGNOSIS — R0989 Other specified symptoms and signs involving the circulatory and respiratory systems: Secondary | ICD-10-CM | POA: Diagnosis not present

## 2024-05-12 NOTE — Progress Notes (Signed)
 Victoria Hess                                          MRN: [id number]   05/12/2024   The [facility] Quality Team Specialist reviewed this patient medical record for the purposes of chart review for care gap closure. The following were reviewed: abstraction for care gap closure-kidney health evaluation for diabetes:eGFR  and uACR.    [facility] Quality Team

## 2024-06-12 ENCOUNTER — Other Ambulatory Visit: Payer: Self-pay

## 2024-06-12 MED ORDER — LEVOTHYROXINE SODIUM 50 MCG PO TABS: 50.0000 ug | ORAL_TABLET | Freq: Every day | ORAL | 1 refills | Status: AC

## 2024-06-17 ENCOUNTER — Telehealth: Payer: Self-pay

## 2024-06-17 NOTE — Telephone Encounter (Signed)
 Okay to change?   Copied from CRM [id number]. Topic: Clinical - Medication Question >> Jun 13, 2024  9:29 AM Rosaria BRAVO wrote: Reason for CRM: Tonya from [facility] called to verify if manufacturer change is okay. The new manufacturer is named [employer] for Levothyroxine    Best contact: [phone]  Reference: [id number]
